# Patient Record
Sex: Female | Born: 1950 | Race: White | Hispanic: No | Marital: Married | Smoking: Never smoker
Health system: Southern US, Community
[De-identification: ages and names within clinical notes are randomized; demographics above are authoritative.]

## PROBLEM LIST (undated history)

## (undated) DIAGNOSIS — G2581 Restless legs syndrome: Secondary | ICD-10-CM

## (undated) DIAGNOSIS — E119 Type 2 diabetes mellitus without complications: Secondary | ICD-10-CM

## (undated) DIAGNOSIS — I10 Essential (primary) hypertension: Secondary | ICD-10-CM

## (undated) DIAGNOSIS — C50919 Malignant neoplasm of unspecified site of unspecified female breast: Secondary | ICD-10-CM

## (undated) DIAGNOSIS — G4733 Obstructive sleep apnea (adult) (pediatric): Secondary | ICD-10-CM

## (undated) DIAGNOSIS — E785 Hyperlipidemia, unspecified: Secondary | ICD-10-CM

## (undated) HISTORY — PX: KNEE SURGERY: SHX244

## (undated) HISTORY — DX: Obstructive sleep apnea (adult) (pediatric): G47.33

## (undated) HISTORY — DX: Hyperlipidemia, unspecified: E78.5

## (undated) HISTORY — DX: Malignant neoplasm of unspecified site of unspecified female breast: C50.919

## (undated) HISTORY — DX: Restless legs syndrome: G25.81

## (undated) HISTORY — DX: Type 2 diabetes mellitus without complications: E11.9

## (undated) HISTORY — DX: Essential (primary) hypertension: I10

## (undated) HISTORY — PX: CHOLECYSTECTOMY: SHX55

---
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
## 1972-01-30 HISTORY — PX: TUBAL LIGATION: SHX77

## 1973-01-29 HISTORY — PX: CARPAL TUNNEL RELEASE: SHX101

## 1992-01-30 HISTORY — PX: CARPAL TUNNEL RELEASE: SHX101

## 2003-12-28 ENCOUNTER — Ambulatory Visit: Payer: Self-pay | Admitting: Cardiology

## 2004-01-30 HISTORY — PX: PARTIAL HYSTERECTOMY: SHX80

## 2006-01-29 HISTORY — PX: TOTAL KNEE ARTHROPLASTY: SHX125

## 2006-01-29 HISTORY — PX: BACK SURGERY: SHX140

## 2006-07-11 ENCOUNTER — Inpatient Hospital Stay (HOSPITAL_COMMUNITY): Admission: RE | Admit: 2006-07-11 | Discharge: 2006-07-13 | Payer: Self-pay | Admitting: Orthopedic Surgery

## 2008-07-16 IMAGING — CR DG CHEST 2V
2 series · 2 of 2 positions shown · non-contrast
Comparison: There are no prior studies for comparison.

CLINICAL DATA: Preop for lumbar spine surgery today, hypertension. Reported HNP at L4-5 and bilateral foraminal stenosis at L4-S1. 
CHEST - 2 VIEW:

[w chest pa]
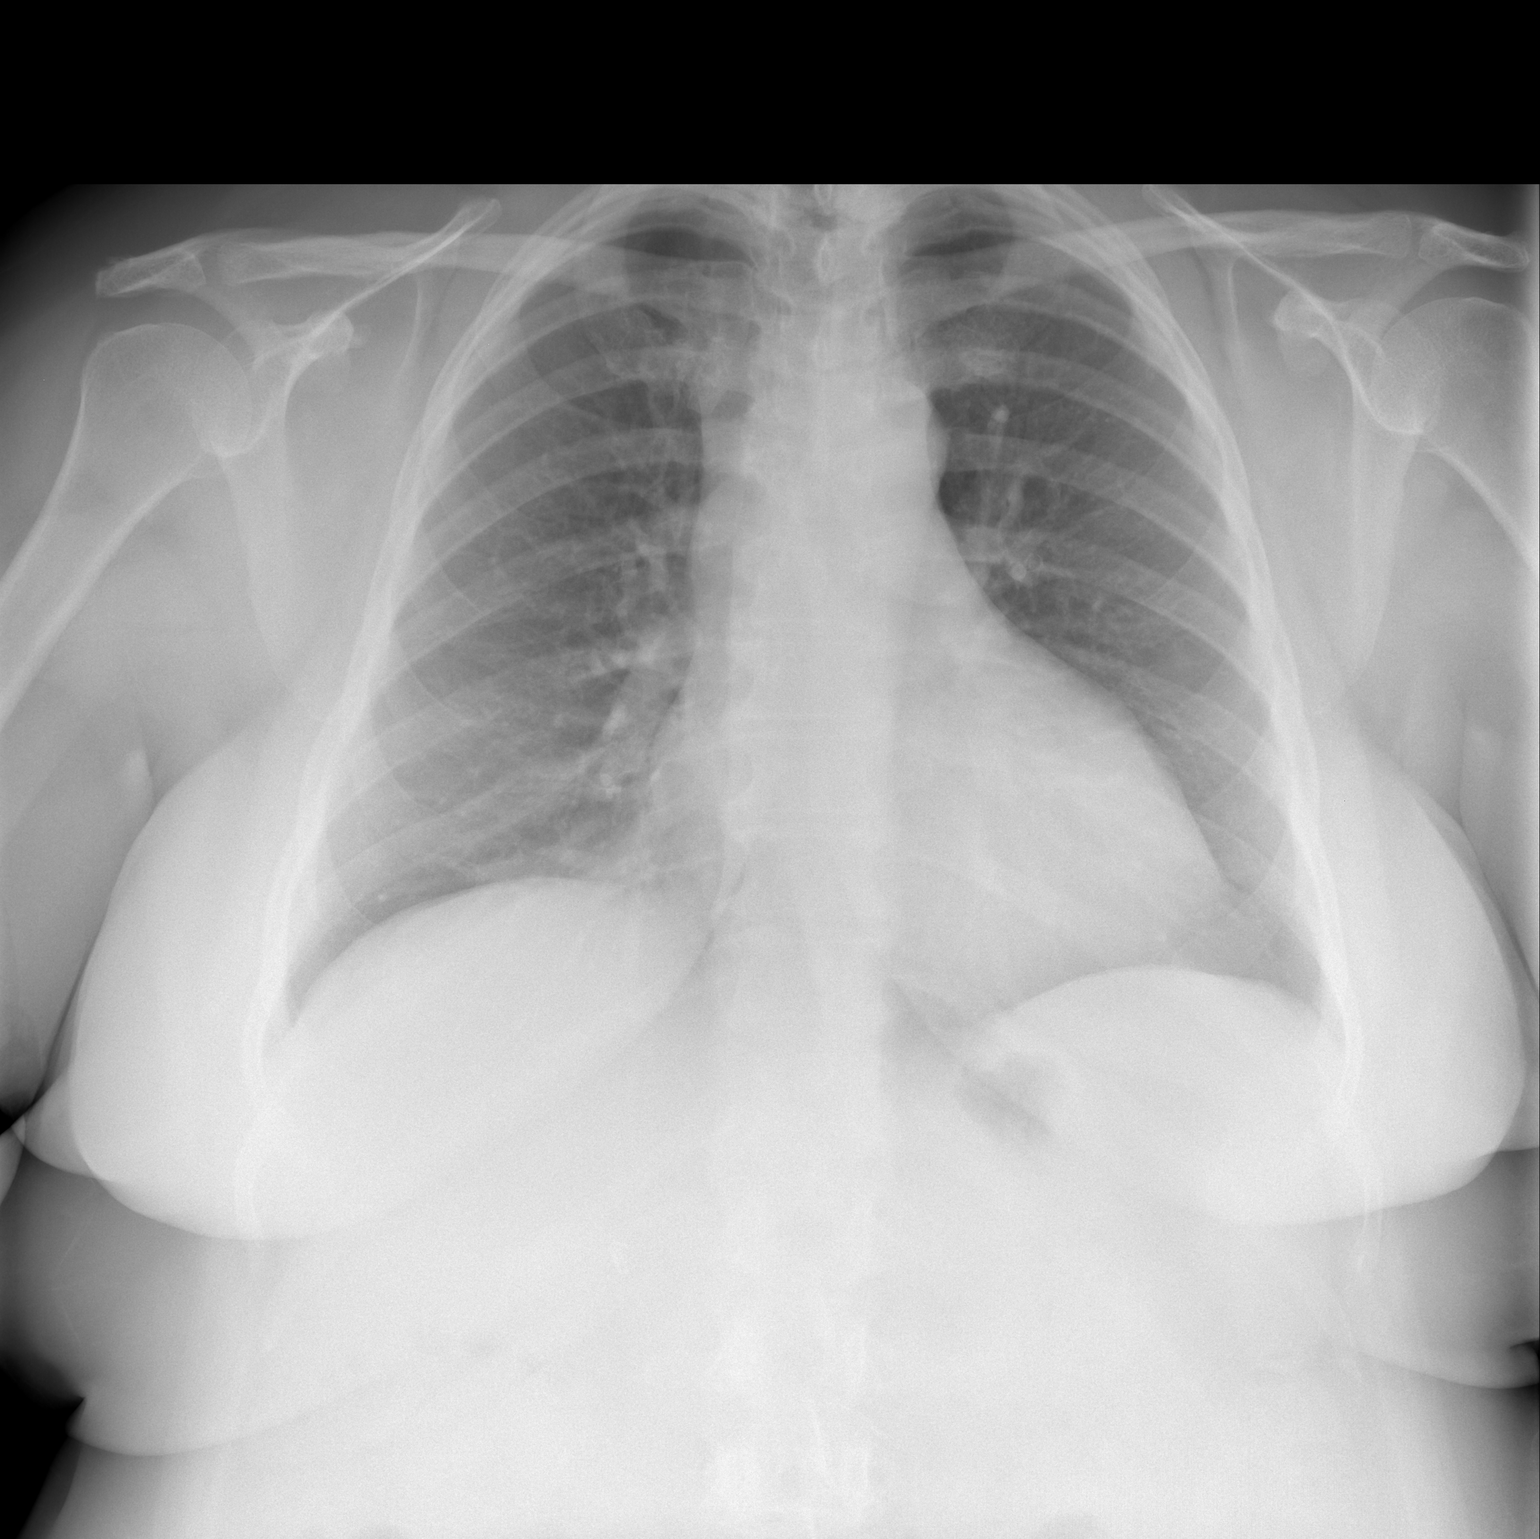

[w chest lat]
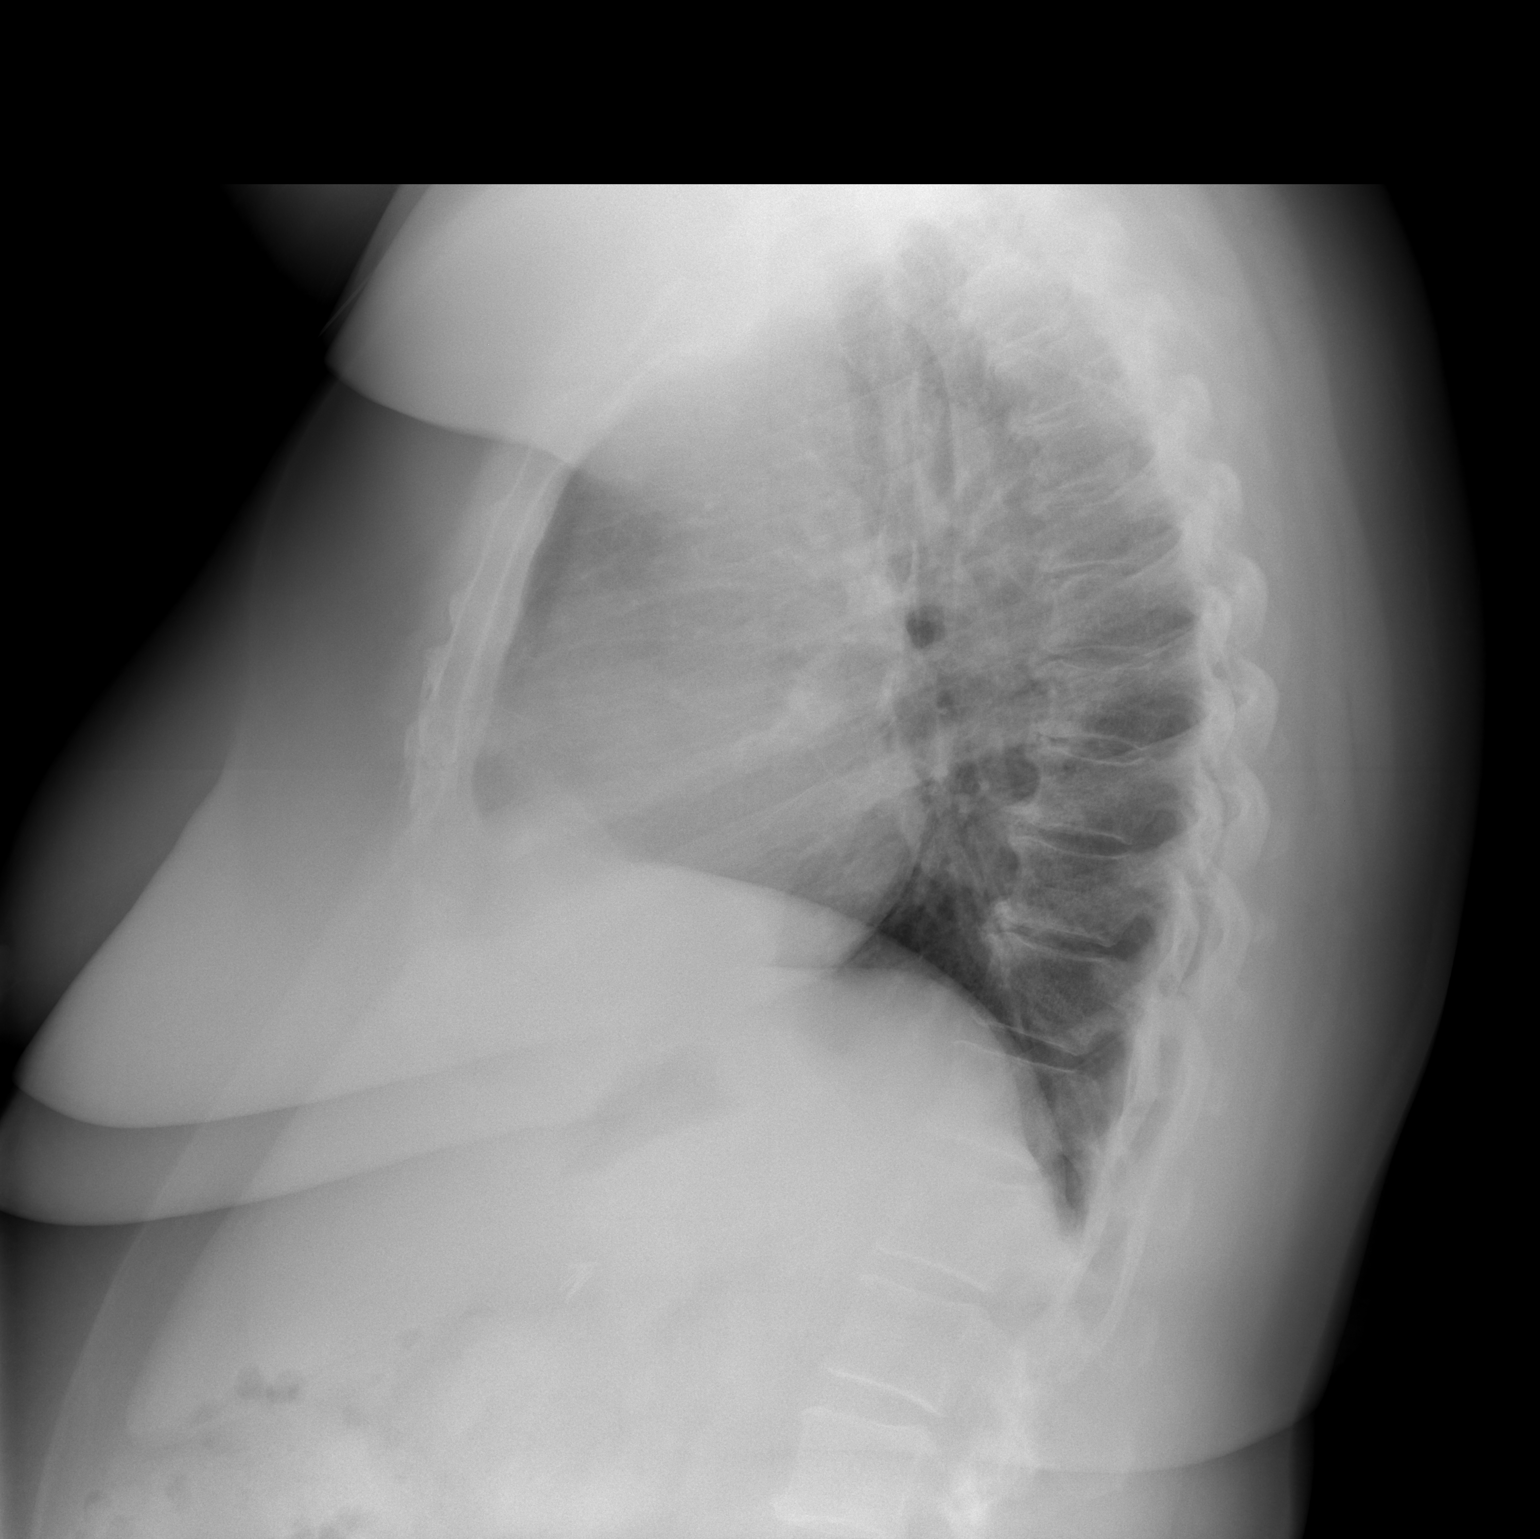

[2 of 2 positions shown; findings below may reference images not displayed]

FINDINGS: The heart appears mild to moderately enlarged.  No pulmonary vascular congestion or active lung process.   Degenerative spondylosis of the thoracic spine.
IMPRESSION: Cardiomegaly.

## 2009-12-16 ENCOUNTER — Encounter: Admission: RE | Admit: 2009-12-16 | Discharge: 2009-12-16 | Payer: Self-pay | Admitting: Family Medicine

## 2009-12-23 ENCOUNTER — Encounter: Admission: RE | Admit: 2009-12-23 | Discharge: 2009-12-23 | Payer: Self-pay | Admitting: Family Medicine

## 2010-01-29 HISTORY — PX: BREAST LUMPECTOMY: SHX2

## 2010-05-24 ENCOUNTER — Encounter (HOSPITAL_COMMUNITY): Payer: PRIVATE HEALTH INSURANCE

## 2010-05-24 ENCOUNTER — Other Ambulatory Visit: Payer: Self-pay | Admitting: Orthopedic Surgery

## 2010-05-24 ENCOUNTER — Other Ambulatory Visit (HOSPITAL_COMMUNITY): Payer: Self-pay | Admitting: Orthopedic Surgery

## 2010-05-24 ENCOUNTER — Ambulatory Visit (HOSPITAL_COMMUNITY)
Admission: RE | Admit: 2010-05-24 | Discharge: 2010-05-24 | Disposition: A | Discharge status: Home / Self Care | Payer: PRIVATE HEALTH INSURANCE | Source: Ambulatory Visit | Attending: Orthopedic Surgery | Admitting: Orthopedic Surgery

## 2010-05-24 DIAGNOSIS — Z01818 Encounter for other preprocedural examination: Secondary | ICD-10-CM

## 2010-05-24 DIAGNOSIS — M1710 Unilateral primary osteoarthritis, unspecified knee: Secondary | ICD-10-CM | POA: Insufficient documentation

## 2010-05-24 DIAGNOSIS — Z01811 Encounter for preprocedural respiratory examination: Secondary | ICD-10-CM | POA: Insufficient documentation

## 2010-05-24 DIAGNOSIS — Z853 Personal history of malignant neoplasm of breast: Secondary | ICD-10-CM | POA: Insufficient documentation

## 2010-05-24 DIAGNOSIS — R0602 Shortness of breath: Secondary | ICD-10-CM | POA: Insufficient documentation

## 2010-05-24 DIAGNOSIS — Z01812 Encounter for preprocedural laboratory examination: Secondary | ICD-10-CM | POA: Insufficient documentation

## 2010-05-24 DIAGNOSIS — M47814 Spondylosis without myelopathy or radiculopathy, thoracic region: Secondary | ICD-10-CM | POA: Insufficient documentation

## 2010-05-24 LAB — URINALYSIS, ROUTINE W REFLEX MICROSCOPIC
Glucose, UA: NEGATIVE mg/dL
Protein, ur: NEGATIVE mg/dL
Specific Gravity, Urine: 1.021 (ref 1.005–1.030)
pH: 6 (ref 5.0–8.0)

## 2010-05-24 LAB — DIFFERENTIAL
Basophils Absolute: 0.1 10*3/uL (ref 0.0–0.1)
Basophils Relative: 1 % (ref 0–1)
Neutro Abs: 3.3 10*3/uL (ref 1.7–7.7)
Neutrophils Relative %: 45 % (ref 43–77)

## 2010-05-24 LAB — APTT: aPTT: 31 seconds (ref 24–37)

## 2010-05-24 LAB — SURGICAL PCR SCREEN: MRSA, PCR: NEGATIVE

## 2010-05-24 LAB — PROTIME-INR: Prothrombin Time: 13.1 seconds (ref 11.6–15.2)

## 2010-05-24 LAB — BASIC METABOLIC PANEL
CO2: 29 mEq/L (ref 19–32)
Calcium: 9.6 mg/dL (ref 8.4–10.5)
Creatinine, Ser: 1.01 mg/dL (ref 0.4–1.2)
GFR calc Af Amer: >60 mL/min (ref >60)

## 2010-05-24 LAB — CBC
Hemoglobin: 11.5 g/dL — ABNORMAL LOW (ref 12.0–15.0)
RBC: 4.02 MIL/uL (ref 3.87–5.11)

## 2010-05-29 ENCOUNTER — Inpatient Hospital Stay (HOSPITAL_COMMUNITY)
Admission: RE | Admit: 2010-05-29 | Discharge: 2010-06-01 | DRG: 470 | Disposition: A | Discharge status: Home / Self Care | Payer: PRIVATE HEALTH INSURANCE | Source: Ambulatory Visit | Attending: Orthopedic Surgery | Admitting: Orthopedic Surgery

## 2010-05-29 DIAGNOSIS — M216X9 Other acquired deformities of unspecified foot: Secondary | ICD-10-CM | POA: Diagnosis present

## 2010-05-29 DIAGNOSIS — M1710 Unilateral primary osteoarthritis, unspecified knee: Principal | ICD-10-CM | POA: Diagnosis present

## 2010-05-29 DIAGNOSIS — G2581 Restless legs syndrome: Secondary | ICD-10-CM | POA: Diagnosis present

## 2010-05-29 DIAGNOSIS — E119 Type 2 diabetes mellitus without complications: Secondary | ICD-10-CM | POA: Diagnosis present

## 2010-05-29 LAB — GLUCOSE, CAPILLARY

## 2010-05-29 LAB — TYPE AND SCREEN: Antibody Screen: NEGATIVE

## 2010-05-30 LAB — BASIC METABOLIC PANEL
BUN: 15 mg/dL (ref 6–23)
CO2: 27 mEq/L (ref 19–32)
Calcium: 8.5 mg/dL (ref 8.4–10.5)
Creatinine, Ser: 0.83 mg/dL (ref 0.4–1.2)
GFR calc Af Amer: >60 mL/min (ref >60)
Glucose, Bld: 133 mg/dL — ABNORMAL HIGH (ref 70–99)

## 2010-05-30 LAB — CBC
HCT: 29.7 % — ABNORMAL LOW (ref 36.0–46.0)
Hemoglobin: 9.4 g/dL — ABNORMAL LOW (ref 12.0–15.0)
MCH: 28.9 pg (ref 26.0–34.0)
MCHC: 31.6 g/dL (ref 30.0–36.0)
MCV: 91.4 fL (ref 78.0–100.0)

## 2010-05-31 LAB — URINALYSIS, MICROSCOPIC ONLY
Glucose, UA: NEGATIVE mg/dL
Leukocytes, UA: NEGATIVE
Nitrite: NEGATIVE
Protein, ur: NEGATIVE mg/dL
pH: 5 (ref 5.0–8.0)

## 2010-05-31 LAB — CBC
HCT: 31.2 % — ABNORMAL LOW (ref 36.0–46.0)
Hemoglobin: 9.8 g/dL — ABNORMAL LOW (ref 12.0–15.0)
MCV: 91.5 fL (ref 78.0–100.0)
RBC: 3.41 MIL/uL — ABNORMAL LOW (ref 3.87–5.11)
WBC: 9.9 10*3/uL (ref 4.0–10.5)

## 2010-05-31 LAB — BASIC METABOLIC PANEL
BUN: 16 mg/dL (ref 6–23)
Chloride: 99 mEq/L (ref 96–112)
Glucose, Bld: 117 mg/dL — ABNORMAL HIGH (ref 70–99)
Potassium: 4.1 mEq/L (ref 3.5–5.1)
Sodium: 137 mEq/L (ref 135–145)

## 2010-05-31 LAB — GLUCOSE, CAPILLARY
Glucose-Capillary: 126 mg/dL — ABNORMAL HIGH (ref 70–99)
Glucose-Capillary: 143 mg/dL — ABNORMAL HIGH (ref 70–99)

## 2010-06-01 LAB — URINE CULTURE
Colony Count: NO GROWTH
Culture: NO GROWTH

## 2010-06-02 NOTE — Op Note (Signed)
Courtney Floyd, Courtney Floyd               ACCOUNT NO.:  [id number]  MEDICAL RECORD NO.:  [id number]           PATIENT TYPE:  I  LOCATION:  1616                         FACILITY:  [facility]  PHYSICIAN:  Madlyn Frankel. Charlann Boxer, M.D.  DATE OF BIRTH:  1950/02/27  DATE OF PROCEDURE: DATE OF DISCHARGE:                              OPERATIVE REPORT   DATE OF PROCEDURE:  May 29, 2010  PREOPERATIVE DIAGNOSIS:  Right knee osteoarthritis with significant varus deformity.  POSTOPERATIVE DIAGNOSIS:  Right knee osteoarthritis with significant varus deformity.  PROCEDURE:  Right total knee replacement utilizing DePuy component size 2, rotating platform, size 2 femur, 2 tibia, 10-mm insert and a 35 patellar button.  SURGEON:  Madlyn Frankel. Charlann Boxer, M.D.  ASSISTANT:  Charlene Brooke, PA-C  ANESTHESIA:  Spinal.  SPECIMEN:  None.  COMPLICATIONS:  None.  DRAINS:  One Hemovac.  TOURNIQUET TIME:  56 minutes at 250 mmHg.  INDICATIONS FOR THE PROCEDURE:  Courtney Floyd is a 60 year old pleasant female referred with a request for surgical consideration for advanced right knee osteoarthritis with significant varus flexion deformity, she had failed conservative measures, had significant reduction in overall quality of life and at this point is ready to proceed with more definitive measures.  Risks of infection, DVT, component failure, need for revision surgery were all discussed and reviewed.  Consent was obtained for the benefit of pain relief.  PROCEDURE IN DETAIL:  The patient was brought to operative theater. Once adequate anesthesia, preoperative antibiotics, Ancef 2 grams were administered the patient was positioned supine and on the right thigh tourniquet placed.  The right lower extremity was prepped and draped in a sterile fashion with the right leg placed in a Mayo legholder.  A time- out was performed identifying the patient, planned procedure and extremity.  The leg was exsanguinated, tourniquet elevated  to 250 mmHg.  A midline incision was made followed by median and parapatellar arthrotomy.  Following initial exposure and debridement, attention was first directed to the patella.  She was noted to have significant fibrosis and scarring tied into this patellofemoral area laterally.  The precut measurement was approximately 21 mm.  I resected down to 14 mm using a 35 patellar button to restore height.  Lug holes were drilled and a metal shim placed to protect the patella from retraction of the saw blades.  Attention was now directed to the femur.  A femoral canal was opened with a drill, irrigated to try to prevent fat emboli.  An intramedullary rod was passed at 3 degrees of valgus.  I resected initially 10 mm bone off the distal femur.  Following this resection the tibia was subluxated anteriorly.  I had already done a proximal medial peel, then further opened up this medial side due to her advanced medial disease.  I removed medial osteophytes.  An extramedullary guide was positioned and based on the proximal lateral tibia 10-mm bone was resected.  I did this as a heavy 10 mm resection to allow me to try to cover as much of the medial defect as possible, which I did.  Following this resection, we confirmed that the  cut was perpendicular in a coronal plane and an appropriate slope using an alignment rod and then checked to see if the gap was going to be at least open with a 10-mm block.  Once I was confident that it still felt that she was tight medially I did release a bit more tissues medially at this point, soft tissues on the deep MCL side.  At this point I sized the femur, initially I decided to be a 2-1/2, a 2- 1/2 rotation block was pinned into position, anterior reference using the C clamp of the proximal tibial cut.  The 4-1 cutting block was then pinned into position, drilled and keel punched.  The anterior, posterior and chamfer cuts were then made without difficulty, no  notching.  A final box cut was made off the lateral aspect of the distal femur. The tibia was then subluxated anteriorly and given the medial significant degenerative changes with remodelling medially I removed medial osteophytes further less inside of the defect that was present and lateralized a size 52 tibial tray.  I then drilled and keel punched this trial reduction of the 2-1/2 femur, 2 tibia, 10-mm insert.  With this insert in place it still felt tight medially and I did not feel that I would get it out to full extension.  There was also some tightness in flexion upon trying to place the insert.  Given this I made a decision to downsize the femur to a size 2.  At the same time I was going to remove 2 more millimeters bone off the distal femur for extension gap purposes.  With this the trial femur was removed, the distal femoral cut was pinned into position using the angel wing to hold the cutting block in position, then backed up 2 mm and removed 2 more millimeters distal bone.  I then pinned the original 2-1/2 of 4-1 cutting block into position and exchanged  this out for a size 2 block in that way to equal out the anterior and posterior cuts.  The 4-1 cutting of the anterior and posterior chamfer cuts were revisited as was the box cut.  At this point a trial reduction with a 2 tibial tray in place and a 10-mm insert.  I was happy that the knee at this point came to full extension. It still felt a little tight medially and with the knee out in extension I did release a little bit more of the medial tissues.  At this point I felt that the knee came out in full extension and the knee felt much more balanced and stable from extension of flexion without increase in size of the polyethylene insert.  The patella tracked through the trochlea without application of pressure.  Given all these findings I removed all the trial components.  The final components were opened including the  polyethylene insert.  The knee was injected with 1/4% Marcaine with epinephrine and 1 mL of Toradol into the synovial capsule junction, total of 61 mL.  I then irrigated the knee out with normal saline solution pulse lavage.  Final components were opened and cement was mixed with gentamicin impregnated cement.  The final closure was cemented into position onto clean and dry cut surfaces of the bone.  The knee was brought into extension with a 10 mm insert and extruded cement was removed.  Once this had fully cured and excess cement was removed throughout the knee the final 10-mm insert was chosen.  The tourniquet had been  let down at 56 minutes at 250 mmHg. The knee was re-irrigated and a Hemovac drain was placed deep.  The extensor mechanism was then reapproximated using #1 Vicryl and the knee in flexion.  The remainder of the wound was closed with 2-0 Vicryl and running 4-0 Monocryl.  The knee was cleaned, dried and dressed sterilely using Dermabond operative dressing.  The drain was dressed separately.  The knee was wrapped loosely in an Ace wrap.  She was brought to the recovery room in a stable condition having tolerated the procedure well.     [name], M.D.     MDO/MEDQ  D:  05/29/2010  T:  05/30/2010  Job:  [id number]  Electronically Signed by Durene Romans M.D. on 06/02/2010 08:13:39 AM

## 2010-06-13 NOTE — Op Note (Signed)
NAMEAZARIYA, FREEMAN NO.:  [id number]   MEDICAL RECORD NO.:  [id number]          PATIENT TYPE:  INP   LOCATION:  1537                         FACILITY:  [facility]   PHYSICIAN:  Marlowe Kays, M.D.  DATE OF BIRTH:  05-16-1950   DATE OF PROCEDURE:  07/11/2006  DATE OF DISCHARGE:                               OPERATIVE REPORT   PREOPERATIVE DIAGNOSIS:  1. 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  2. Spondylosis L5-S1 with disk herniation on the right and foraminal      stenosis on the left.   POSTOPERATIVE DIAGNOSIS:  Spondylosis and central and foraminal stenosis  L4-5, L5-S1, no disk herniations noted.   OPERATION:  1. Decompressive laminectomy L4-5 left with inspection of the L4-5      disk and decompression L5 nerve root.  2. Central and foraminal decompression L5-S1 with inspection of the L5-      S1 disk on the right and bilateral foraminotomies.  3. Total laminectomy L5 right with decompression of the L5 nerve root.   SURGEON:  Dr. Simonne Come   ASSISTANT:  Georges Lynch. Darrelyn Hillock, M.D.   ANESTHESIA:  General.   JUSTIFICATION FOR PROCEDURE:  She has had a long history of restless leg  syndrome more recently significant pain mainly in the left leg, also  slightly in the right and she is presented with a foot drop on the left.  An MRI has demonstrated the preoperative diagnoses.  Original plan was  at L4-5 to perform microdiskectomy on the left and foraminal  decompression on the right and at L5-S1 microdiskectomy on the right and  foraminal decompression on the left.  We were unable to find any disk  herniations at the two levels depicted on the MRI and on postoperative  review of her plain x-rays, bone spurring at these levels was what was  probably creating the picture of the disk herniations on the MRI.   PROCEDURE:  Prophylactic antibiotics, satisfactory general anesthesia,  time-out  performed, knee-chest position on the [place] frame.  Back was  prepped with DuraPrep and with two spinal needles and lateral x-ray.  We  tentatively located the L4-5, L5-S1 interspaces.  I then continued  draping the back in a sterile field, Ioban employed.  Vertical midline  incision. She is quite heavy and was somewhat of free bleeder making  progress slow but we eventually were able to dissect the soft tissue off  the lower lumbar vertebra, placed self-retaining McCullough retractor.  A second lateral x-ray was taken confirming our locations as L4-5 and L5-  S1. Working first at L5-S1 on the left which I felt would be a  foraminotomy. Because of the spondylosis her space was quite tight. I  did a preliminary partial decompression and then moved up to 4-5 on the  left with a similar type of problem with exposure. At this point we  brought in the microscope and I completed the decompression L5-S1  because of the difficulty in working in the deep hole  and with her  stenosis.  I did central exposure at L5-S1 and we worked both the left  and right performing foraminotomies and full decompression. Then on the  right where we planned to do a foraminal decompression.  She had  somewhat different anatomy then on the left with a short lamina and I  ended up removing essentially the entire lamina of L5, working from  caudad to cephalad until I could get up to the L4-5 neural foramen and  decompress it thoroughly. We then looked at the disk at L5-S1 and it was  hard with no disk material obtainable.  Lastly, I then went to L4-5 on  the left and completed the decompression there with a wide decompression  of the L5 nerve root.  We identified the disk which was likewise hard.  There was no extruded disk material and on review of the plain x-rays.  It appeared that the apparent extruded disk material was due to bone.  Having thoroughly decompressed all four nerve roots and with no  complications and  we concluded that we could not improve things.  Wound  was irrigated with sterile saline.  I placed a 1/4 inch Penrose drain  deep because of her bleeding on top of Gelfoam soaked in thrombin and  over the exposed dura. The wound was then closed in layers under direct  visualization around the drain with interrupted #1 Vicryl in the fascia  and deep subcutaneous tissue, 2-0 Vicryl subcutaneous tissue  superficially and staples in the skin.  Betadine Adaptic dry sterile  dressing were applied.  She was gently placed in her PACU bed and taken  in satisfactory condition with no known complications.  Estimated blood  loss was perhaps 100 mL, no blood replacement.           ______________________________  Marlowe Kays, M.D.     JA/MEDQ  D:  07/12/2006  T:  07/12/2006  Job:  [id number]

## 2010-06-19 NOTE — H&P (Signed)
Courtney Floyd, Courtney Floyd NO.:  [id number]  MEDICAL RECORD NO.:  [id number]           PATIENT TYPE:  LOCATION:                                 FACILITY:  PHYSICIAN:  Madlyn Frankel. Charlann Boxer, M.D.  DATE OF BIRTH:  [birth year]  DATE OF ADMISSION:  05/29/2010 DATE OF DISCHARGE:                             HISTORY & PHYSICAL   DATE OF SURGERY:  May 29, 2010.  ADMISSION DIAGNOSIS:  Right knee osteoarthritis.  HISTORY OF PRESENT ILLNESS:  This is 60 year old lady with a history of osteoarthritis of the right knee with failure of conservative treatment to eliminate her pain.  After discussion of treatment, benefits, risks, and options, the patient is now scheduled for total knee arthroplasty. Please note that the patient is not a candidate for tranexamic acid and will not receive it preop.  She is given her home medications to take. Note that she also has sleep apnea and uses CPAP and we will use that in the hospital.  PAST MEDICAL HISTORY:  Drug Allergies:  CODEINE with itching.  Current Medications: 1. Levothyroxine 50 mcg 1 daily. 2. Diclofenac 75 mg 1 daily. 3. Lasix 80 mg 1/2 tablet daily. 4. Lisinopril/hydrochlorothiazide 20/25 one daily. 5. Metformin ER 500 mg 2 tablets in the evening. 6. ReQuip 2 mg 1 tablet morning and evening. 7. ReQuip XL 2 mg 1 tablet a.m., 1 tablet afternoon, and 1 tablet in     the evening. 8. Citalopram 40 mg 1 tablet in the morning and 1/2 tablet in the     evening. 9. Hydrocodone 5/325 p.r.n. pain. 10.Tramadol 50 mg p.r.n. pain. 11.Tamoxifen 20 mg 1 daily. 12.Nitrostat 0.4 mg p.r.n. chest pain which is rarely used.  Nonprescription medicines include: 1. Aspirin 81 mg once a day. 2. B complex magnesium. 3. Potassium gluconate.  Previous surgeries include tonsillectomy, cesarean section, tubal ligation, hysterectomy, liver biopsy for fatty liver disease, carpal tunnel release, cholecystectomy, kidney stones removed with a  stent, oophorectomy, carpal tunnel release, lumbar disk surgery, and breast lumpectomy for breast cancer.  The patient has had 35 radiation treatments for her breast cancer.  MEDICAL DOCTOR: 1. Dr. Fara Chute in [place]. 2. Lindaann Slough, MD, for Urology. 3. Danella Maiers, MD, for heart disease.  FAMILY HISTORY:  Positive for heart disease and cancer.  SOCIAL HISTORY:  The patient is married.  She is living at home.  She does not smoke and does not drink.  REVIEW OF SYSTEMS:  CENTRAL NERVOUS SYSTEM:  Positive for anxiety and occasional impaired memory.  PULMONARY:  Negative for shortness breath, PND, or orthopnea.  Positive for sleep apnea and she does use CPAP and is advised to bring her mask with her to the hospital.  CARDIOVASCULAR: Positive for occasional chest pain that she relates to stress.  She did have clearance from her medical doctor.  GI:  Negative for ulcers or hepatitis.  Positive for gallbladder disease in the past.  GU:  Positive for history of kidney stone.  HEMATOLOGIC/ONCOLOGIC:  Positive for breast cancer.  MUSCULOSKELETAL:  Positive as in HPI.  PHYSICAL EXAMINATION:  VITAL SIGNS:  BP 140/68, respirations  14, and pulse 60 and regular. GENERAL APPEARANCE:  This is a well-developed, well-nourished lady in no acute distress. HEENT:  Head is normocephalic.  Nose is patent.  Ears are patent. Pupils are equal, round, and reactive to light.  Throat without injection. NECK:  Supple without adenopathy.  Carotids are 2+ without bruit. CHEST:  Clear to auscultation.  No rales or rhonchi.  Respirations are 40. HEART:  Regular rate and rhythm at 68 beats per minute without murmur. ABDOMEN:  Soft with active bowel sounds.  No masses or organomegaly. NEUROLOGIC:  The patient is alert and oriented to time, place, and person.  Cranial nerves II-XII grossly intact. EXTREMITIES:  Right knee with a 10-degree flexion contracture and further flexion to 95 degrees with pain.   Neurovascular status is intact.  IMPRESSION:  Osteoarthritis, right knee.  PLAN:  Total knee arthroplasty, right knee.     Jaquelyn Bitter. Chabon, P.A.   ______________________________ [name], M.D.    SJC/MEDQ  D:  05/24/2010  T:  05/25/2010  Job:  [id number]  Electronically Signed by Jodene Nam P.A. on 06/19/2010 12:39:52 PM Electronically Signed by Durene Romans M.D. on 06/19/2010 01:00:46 PM

## 2010-06-20 NOTE — Discharge Summary (Signed)
Courtney Floyd, TSAO               ACCOUNT NO.:  [id number]  MEDICAL RECORD NO.:  [id number]           PATIENT TYPE:  I  LOCATION:  1616                         FACILITY:  [facility]  PHYSICIAN:  Madlyn Frankel. Charlann Boxer, M.D.  DATE OF BIRTH:  03-27-1950  DATE OF ADMISSION:  05/29/2010 DATE OF DISCHARGE:  06/01/2010                              DISCHARGE SUMMARY   ADMITTING DIAGNOSIS:  Right knee osteoarthritis.  DISCHARGE DIAGNOSES: 1. Right knee osteoarthritis status post right total knee replacement,     May 29, 2010. 2. Hypothyroidism. 3. Hypertension. 4. Diabetes. 5. Restless legs syndrome.  She has had lumbar spine surgery with some chronic pain as well as history of breast cancer, for which she is currently still on tamoxifen.  ADMITTING HISTORY:  [name] is a 60 year old female with history of right knee osteoarthritis, who had failed conservative measures.  She was seen and evaluated in the office routine time, failing injections and medications.  She, at this point, is ready for more definitive measures.  Risks and benefits of knee replacement surgery were discussed and reviewed. Consent was obtained for right total knee replacement.  She is not a candidate for Tranexamic Acid due to history of breast cancer.  HOSPITAL COURSE:  The patient admitted for same-day surgery on May 29, 2010.  Please see dictated operative note for full details of the procedure.  After routine stay in the recovery room, she was transferred to the orthopedic ward where she remained until postop day #3 at the time of discharge.  Postoperative day #1, she had her Foley catheter and Hemovac drains removed.  She was seen and evaluated by Physical Therapy.  Postoperative day #1 labs included hematocrit of 29.7 with stable electrolytes.  No evidence of any bump in her creatinine.  By postoperative day #2, her labs were stable without significant change.  Her wound was clean, dry, and intact.  She  was up with physical therapy.  The plan was to go home with home health physical therapy, arrangements were made on postop day 2.  By postop day #3, she remained asymptomatic with normal vitals.  No new labs had been ordered.  We did order urinalysis based on some increased frequency and this was negative at the time of discharge.  Arrangements were made for home health physical therapy.  DISCHARGE CONDITION:  The patient stable at the time of discharge.  DISCHARGE INSTRUCTIONS:  The patient will be discharged home with home health physical therapy to work on range of motion, strengthening, and gait training.  She will return to see Dr. Durene Romans at [facility] at [phone] in 2 weeks' time.  Should be on a diabetic diet or at least her home diet managing her diabetes as much as possible help reduce chance of infection.  DISCHARGE MEDICATIONS: 1. Colace 100 mg p.o. b.i.d. as needed for constipation while on pain     medicine. 2. Enteric-coated aspirin 325 mg p.o. b.i.d. for 30 days. 3. Iron 325 mg p.o. daily 2 to 3 times a day as needed for 2 weeks. 4. Norco 7.5/325 one to two tablets every  4 to 6 hours as needed for     pain. 5. Robaxin 500 mg p.o. q.6 h. as needed for muscle spasm and pain. 6. MiraLax 17 g p.o. daily as needed for constipation while on pain     medicine. 7. Aspirin 81 mg, once she has completed 325 mg dose, she will return     to this at 30 days. 8. Citalopram 20 mg p.o. q.h.s. __________ 9. Diclofenac 75 mg p.o. b.i.d. 10.Lasix 80 mg one-half to one tablet daily. 11.Levothyroxine unknown micrograms 1 tablet q.a.m. 12.Lisinopril/hydrochlorothiazide 20/25 mg one p.o. daily. 13.Magnesium gluconate p.o. daily. 14.Metformin 1000 mg q.h.s. 15.Multivitamins p.o. daily. 16.Nitroglycerin sublingual as needed. 17.Potassium gluconate p.o. daily. 18.ReQuip 2 mg b.i.d. as needed. 19.Septra DS b.i.d. for urinary tract infection, but this was  stopped. 20.Tamoxifen 20 mg p.o. daily. 21.Vitamin B complex daily.     [name], M.D.     MDO/MEDQ  D:  06/20/2010  T:  06/20/2010  Job:  [id number]  Electronically Signed by Durene Romans M.D. on 06/20/2010 07:51:32 PM

## 2010-08-03 DIAGNOSIS — R079 Chest pain, unspecified: Secondary | ICD-10-CM

## 2010-11-16 LAB — BASIC METABOLIC PANEL
BUN: 15
Calcium: 9.3
GFR calc non Af Amer: >60
Glucose, Bld: 93
Potassium: 4.4

## 2010-11-16 LAB — PROTIME-INR
INR: 0.9
Prothrombin Time: 12.6

## 2012-04-20 ENCOUNTER — Other Ambulatory Visit: Payer: Self-pay | Admitting: Diagnostic Neuroimaging

## 2012-04-28 ENCOUNTER — Other Ambulatory Visit: Payer: Self-pay | Admitting: Diagnostic Neuroimaging

## 2012-05-23 ENCOUNTER — Telehealth: Payer: Self-pay | Admitting: *Deleted

## 2012-05-23 NOTE — Telephone Encounter (Signed)
pls setup follow up appt for next 2-4 weeks with CM or me. -VRP

## 2012-05-23 NOTE — Telephone Encounter (Signed)
Patient states she is having a hard time with her restless legs, per centricity she is on Requip 2 mg. The patient states she has not slept well in a 2-3 weeks, and wants to change medication or increase your dose.

## 2012-05-29 IMAGING — CR DG CHEST 2V
2 series · 2 of 2 positions shown · non-contrast
Comparison: 07/11/2006

CLINICAL DATA: Right knee osteoarthritis, preoperative for
arthroplasty.  Shortness of breath.  History breast cancer.

CHEST - 2 VIEW

[view not recorded (1 of 2)]
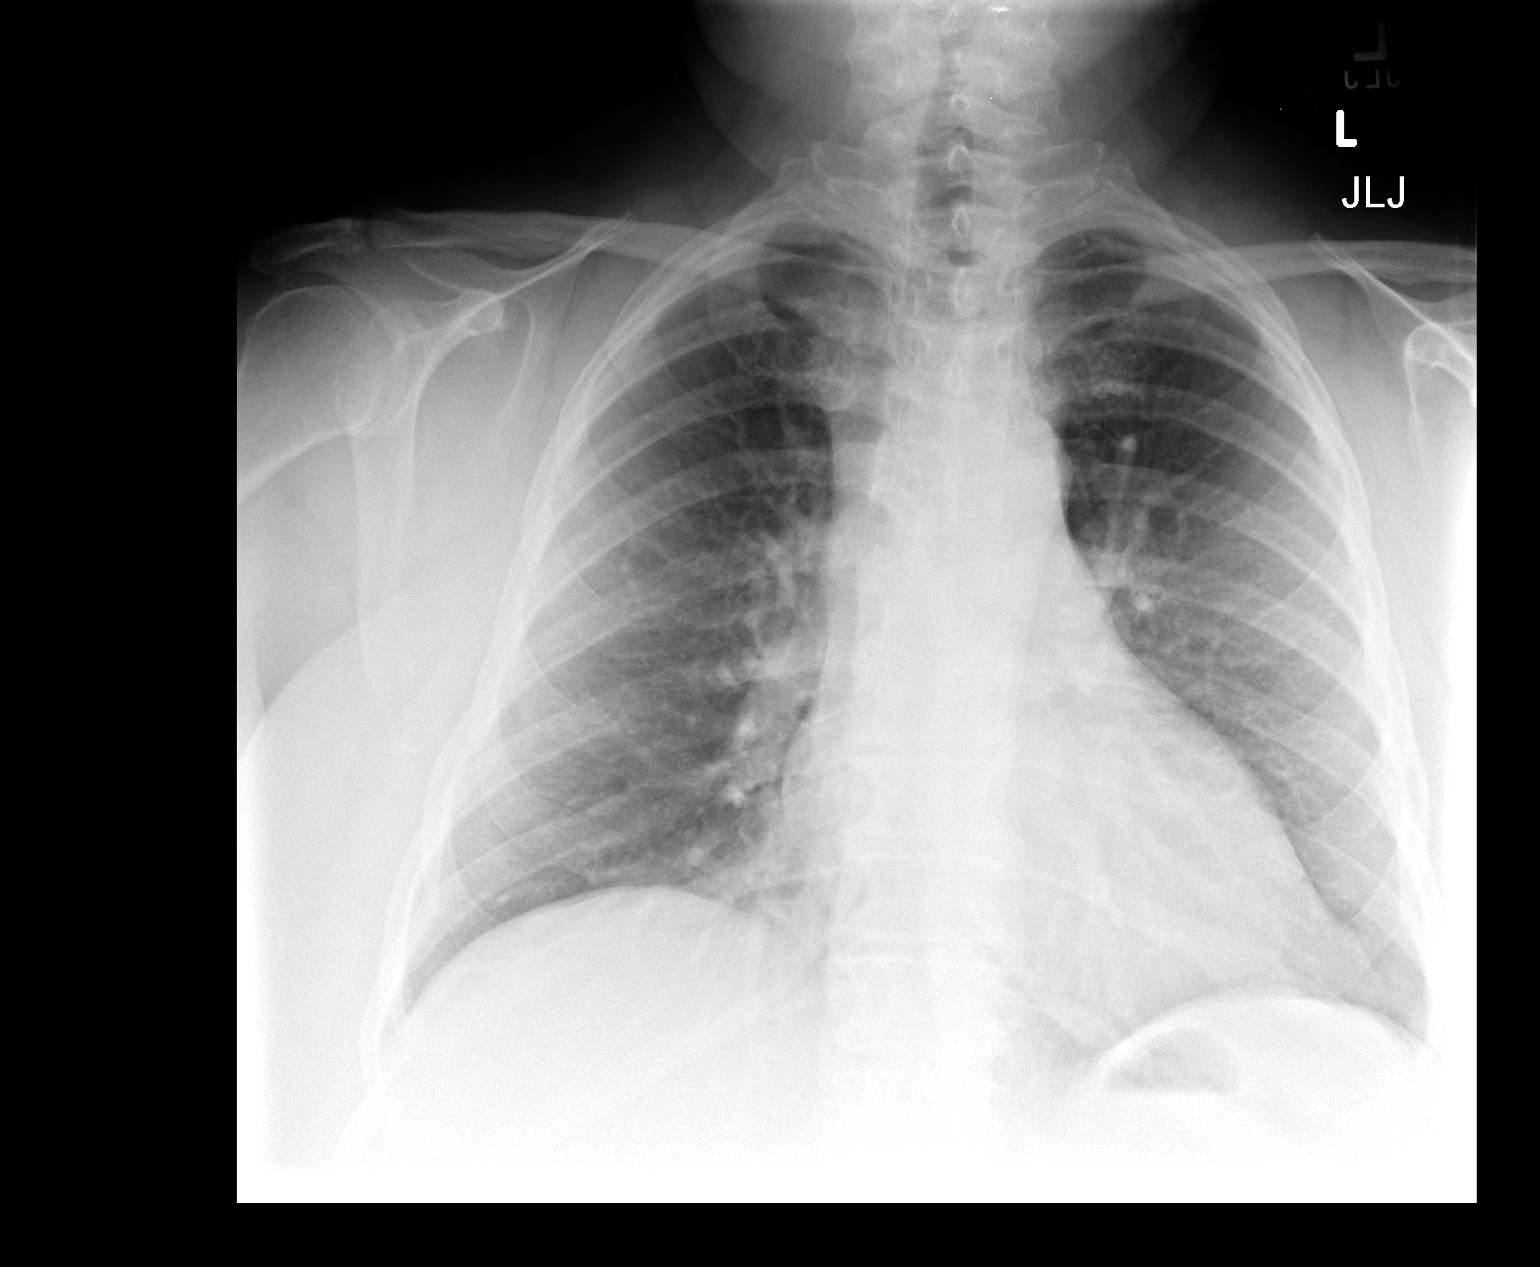

[view not recorded (2 of 2)]
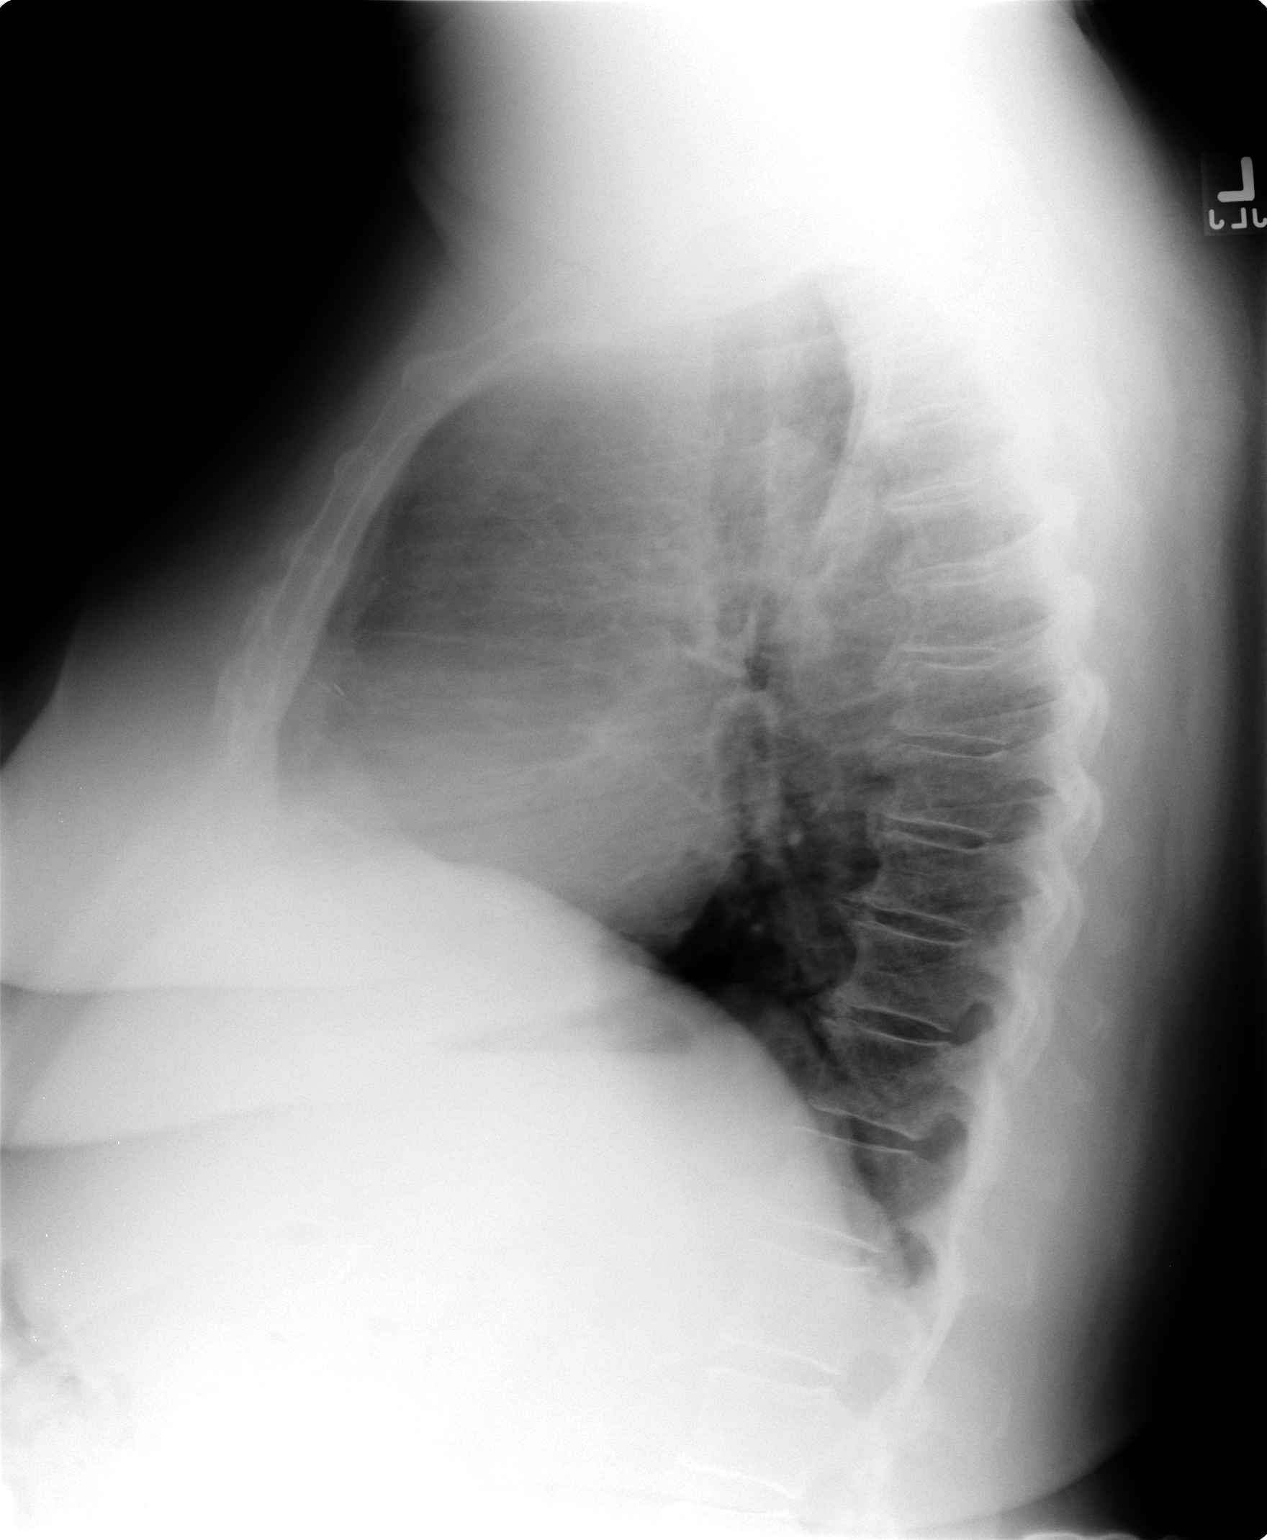

[2 of 2 positions shown; findings below may reference images not displayed]

FINDINGS: Cardiac and mediastinal contours appear normal.

The lungs appear clear.

No pleural effusion is identified.

Thoracic spondylosis noted.
IMPRESSION: No significant abnormality identified.

## 2012-06-16 ENCOUNTER — Ambulatory Visit (INDEPENDENT_AMBULATORY_CARE_PROVIDER_SITE_OTHER): Payer: 59 | Admitting: Internal Medicine

## 2012-06-16 ENCOUNTER — Encounter: Payer: Self-pay | Admitting: Internal Medicine

## 2012-06-16 ENCOUNTER — Encounter: Payer: Self-pay | Admitting: *Deleted

## 2012-06-16 VITALS — BP 133/64 | HR 63 | Ht 62.0 in | Wt 257.0 lb

## 2012-06-16 DIAGNOSIS — R0602 Shortness of breath: Secondary | ICD-10-CM

## 2012-06-16 DIAGNOSIS — Z800 Family history of malignant neoplasm of digestive organs: Secondary | ICD-10-CM | POA: Insufficient documentation

## 2012-06-16 DIAGNOSIS — G2581 Restless legs syndrome: Secondary | ICD-10-CM

## 2012-06-16 DIAGNOSIS — R609 Edema, unspecified: Secondary | ICD-10-CM

## 2012-06-16 DIAGNOSIS — I10 Essential (primary) hypertension: Secondary | ICD-10-CM | POA: Insufficient documentation

## 2012-06-16 DIAGNOSIS — I890 Lymphedema, not elsewhere classified: Secondary | ICD-10-CM | POA: Insufficient documentation

## 2012-06-16 MED ORDER — LOSARTAN POTASSIUM 50 MG PO TABS: 50.0000 mg | ORAL_TABLET | Freq: Every day | ORAL | Status: DC

## 2012-06-16 MED ORDER — OMEPRAZOLE 20 MG PO CPDR: 20.0000 mg | DELAYED_RELEASE_CAPSULE | Freq: Two times a day (BID) | ORAL | Status: DC

## 2012-06-16 NOTE — Patient Instructions (Addendum)
LABS:  BMET, BNP.  STOP Lisinopril and start Cozaar 50mg  daily.  Start Omeprazole 20mg  twice daily.  Your physician has requested that you have an echocardiogram. Echocardiography is a painless test that uses sound waves to create images of your heart. It provides your doctor with information about the size and shape of your heart and how well your heart's chambers and valves are working. This procedure takes approximately one hour. There are no restrictions for this procedure.

## 2012-06-16 NOTE — Progress Notes (Signed)
HPI Patient complains of about 1 year history of progressive SOB, swelling  Has been treated for bronchitis. Treated for pneumonia with shots and steroids last week at Dr Dian Situ office.  COugh is productive of yellow/green mucous. Patient did have CP last week  Constant  Worse with breath. Patient's lasix was increased from 40 qd to 80 AM/40 PM Goes to sleep at drop of hat  Has CPAP but cannot use because of breathing issues now. Does have wheezing  Better after ABX  May be picking up.  Allergies  Allergen Reactions  . Codeine Itching    Current Outpatient Prescriptions  Medication Sig Dispense Refill  . citalopram (CELEXA) 40 MG tablet Take 40 mg by mouth daily.       . diclofenac (VOLTAREN) 75 MG EC tablet Take 75 mg by mouth 2 (two) times daily.      . furosemide (LASIX) 80 MG tablet Take 80 mg by mouth daily.       Marland Kitchen gabapentin (NEURONTIN) 300 MG capsule TAKE 1 CAPSULE BY MOUTH EVERY NIGHT AT BEDTIME , INCREASE TO 1 CAPSULE THREE TIMES DAILY AS TOLERATED  270 capsule  1  . levothyroxine (SYNTHROID, LEVOTHROID) 75 MCG tablet Take 75 mcg by mouth daily before breakfast.       . lisinopril-hydrochlorothiazide (PRINZIDE,ZESTORETIC) 20-25 MG per tablet Take 1 tablet by mouth daily.       . metFORMIN (GLUCOPHAGE-XR) 500 MG 24 hr tablet Take 1,000 mg by mouth daily with breakfast.      . nitroGLYCERIN (NITROSTAT) 0.4 MG SL tablet Place 0.4 mg under the tongue every 5 (five) minutes as needed for chest pain.      Marland Kitchen rOPINIRole (REQUIP) 2 MG tablet TAKE 2 TABLETS BY MOUTH EVERY MORNING , 1 TABLET DAILY AT NOON, AND 2 TABLETS AT BEDTIME  450 tablet  1  . tamoxifen (NOLVADEX) 20 MG tablet Take 20 mg by mouth daily.        No current facility-administered medications for this visit.    No past medical history on file.  No past surgical history on file.  No family history on file.  History   Social History  . Marital Status: Single    Spouse Name: N/A    Number of Children: N/A  .  Years of Education: N/A   Occupational History  . Not on file.   Social History Main Topics  . Smoking status: Never Smoker   . Smokeless tobacco: Not on file  . Alcohol Use: No  . Drug Use: No  . Sexually Active: Not on file   Other Topics Concern  . Not on file   Social History Narrative  . No narrative on file    Review of Systems:  All systems reviewed.  They are negative to the above problem except as previously stated.  Vital Signs: BP 133/64  Pulse 63  Ht 5\' 2"  (1.575 m)  Wt 257 lb (116.574 kg)  BMI 46.99 kg/m2  SpO2 93%  Physical Exam Patient is in NAD HEENT:  Normocephalic, atraumatic. EOMI, PERRLA.  Neck: JVP is normal.  No bruits.  Lungs: Patient with pops, some decreased airflow and wheezing on forced expiration. Heart: Regular rate and rhythm. Normal S1, S2. No S3.   No significant murmurs. PMI not displaced.  Abdomen:  Supple, nontender. Normal bowel sounds. No masses. No hepatomegaly.  Extremities:   Good distal pulses throughout. 1+ lower extremity edema.  Musculoskeletal :moving all extremities.  Neuro:   alert and  oriented x3.  CN II-XII grossly intact.  EKG  SR 67.    Assessment and Plan:  `Patinet is a 62 yo who is referred for evaluation of SOB and edema.  She has not felt well for over 1 year. Recently completed course of po ABX and steroids.  Still not feeling well.  Tired.  Poor breathing has prevented her from using her CPAP  ON exam, patient with very harsh cough  Some wheezing (more upper airway) as well as pops  She also has some LE edema  I would recomm Labs(BMET, BNP) as well as an echo to evaluate systolic/diastolic function I would recomm she switch from Lisinopril/HCTZ to Cozaar 100 I have prescribed omeprazole bid I have also recomm she be seen in pulmonary (appt made with Jerilee Hoh on Wed) Will be in touch with her re lab results/echo results as far as any modifications in lasix.  2.  HTN  Follow  3.  Anemia.  I did not check  CBC today  Hgb in April was 10.0  Will need to be followed.

## 2012-06-17 ENCOUNTER — Encounter: Payer: Self-pay | Admitting: Internal Medicine

## 2012-06-17 ENCOUNTER — Institutional Professional Consult (permissible substitution): Payer: 59 | Admitting: Internal Medicine

## 2012-06-17 LAB — BASIC METABOLIC PANEL
CO2: 33 mEq/L — ABNORMAL HIGH (ref 19–32)
Calcium: 9 mg/dL (ref 8.4–10.5)
Chloride: 103 mEq/L (ref 96–112)
Glucose, Bld: 86 mg/dL (ref 70–99)
Sodium: 146 mEq/L — ABNORMAL HIGH (ref 135–145)

## 2012-06-18 ENCOUNTER — Encounter: Payer: Self-pay | Admitting: Internal Medicine

## 2012-06-18 ENCOUNTER — Ambulatory Visit (INDEPENDENT_AMBULATORY_CARE_PROVIDER_SITE_OTHER): Payer: 59 | Admitting: Internal Medicine

## 2012-06-18 VITALS — BP 144/70 | HR 73 | Temp 99.1°F | Ht 60.5 in | Wt 262.2 lb

## 2012-06-18 DIAGNOSIS — R059 Cough, unspecified: Secondary | ICD-10-CM | POA: Insufficient documentation

## 2012-06-18 DIAGNOSIS — R05 Cough: Secondary | ICD-10-CM | POA: Insufficient documentation

## 2012-06-18 MED ORDER — PREDNISONE (PAK) 10 MG PO TABS: ORAL_TABLET | ORAL | Status: DC

## 2012-06-18 MED ORDER — TRAMADOL HCL 50 MG PO TABS: ORAL_TABLET | ORAL | Status: DC

## 2012-06-18 MED ORDER — FAMOTIDINE 20 MG PO TABS: ORAL_TABLET | ORAL | Status: DC

## 2012-06-18 NOTE — Progress Notes (Signed)
  Subjective:    Patient ID: Courtney Floyd, female    DOB: [birth year] MRN: [id number]  HPI  62 yowf never smoker no resp problems at all until around mid 2000's with episodes of "bad bronchitis" lasting weeks to months" on ACEi  With persistent cough since indolent onset around June 2013 rx'd as asthma and gerd no better so seen by Dr Tenny Craw 5/19 who rec d/c acei and pulmonary eval  06/18/2012 1st pulmonary eval 2 days after acei stopped no better yet, has daily cough x one year imin productive white mucus daily cough worse in am with choking sensation to point of vomiting / urinating and doe x room to room assoc with hoarseness and sore throat.    No obvious daytime variabilty or assoc cp or chest tightness, subjective wheeze overt sinus or hb symptoms. No unusual exp hx or h/o childhood pna/ asthma or premature birth to her knowledge.   Sleeping ok without nocturnal  or early am exacerbation  of respiratory  c/o's or need for noct saba. Also denies any obvious fluctuation of symptoms with weather or environmental changes or other aggravating or alleviating factors except as outlined above    Review of Systems  Constitutional: Negative for fever, chills and unexpected weight change.  HENT: Positive for congestion, sore throat, dental problem and voice change. Negative for ear pain, nosebleeds, rhinorrhea, sneezing, trouble swallowing, postnasal drip and sinus pressure.   Eyes: Negative for visual disturbance.  Respiratory: Positive for cough and shortness of breath. Negative for choking.   Cardiovascular: Positive for chest pain. Negative for leg swelling.  Gastrointestinal: Negative for vomiting, abdominal pain and diarrhea.  Genitourinary: Negative for difficulty urinating.  Musculoskeletal: Positive for arthralgias.  Skin: Negative for rash.  Neurological: Negative for tremors, syncope and headaches.  Hematological: Does not bruise/bleed easily.       Objective:   Physical  Exam  Hoarse amb wf nad with classic pseudowheeze Wt Readings from Last 3 Encounters:  06/18/12 262 lb 3.2 oz (118.933 kg)  06/16/12 257 lb (116.574 kg)    HEENT: nl dentition, turbinates, and orophanx. Nl external ear canals without cough reflex   NECK :  without JVD/Nodes/TM/ nl carotid upstrokes bilaterally   LUNGS: no acc muscle use, clear to A and P bilaterally without cough on insp or exp maneuvers   CV:  RRR  no s3 or murmur or increase in P2, no edema   ABD:  soft and nontender with nl excursion in the supine position. No bruits or organomegaly, bowel sounds nl  MS:  warm without deformities, calf tenderness, cyanosis or clubbing  SKIN: warm and dry without lesions    NEURO:  alert, approp, no deficits    05/15/12 cxr wnl     Assessment & Plan:

## 2012-06-18 NOTE — Patient Instructions (Addendum)
Prilosec 20 mg Take 30- 60 min before your first and last meals of the day and Pepcid 20 mg at bedtime until cough is gone without the need for cough med  Take mucinex dm 1200 mg  every 12 hours and supplement if needed with  tramadol 50 mg up to 2 every 4 hours to suppress the urge to cough. Swallowing water or using ice chips/non mint and menthol containing candies (such as lifesavers or sugarless jolly ranchers) are also effective.  You should rest your voice and avoid activities that you know make you cough.  Once you have eliminated the cough for 3 straight days try reducing the tramadol first,  then the mucinex dm as tolerated.    Prednisone 10 mg take  4 each am x 2 days,   2 each am x 2 days,  1 each am x2days and stop   GERD (REFLUX)  is an extremely common cause of respiratory symptoms, many times with no significant heartburn at all.    It can be treated with medication, but also with lifestyle changes including avoidance of late meals, excessive alcohol, smoking cessation, and avoid fatty foods, chocolate, peppermint, colas, red wine, and acidic juices such as orange juice.  NO MINT OR MENTHOL PRODUCTS SO NO COUGH DROPS  USE SUGARLESS CANDY INSTEAD (jolley ranchers or Stover's)  NO OIL BASED VITAMINS - use powdered substitutes.  Return in two weeks if not satisfied

## 2012-06-19 ENCOUNTER — Telehealth: Payer: Self-pay | Admitting: Internal Medicine

## 2012-06-19 ENCOUNTER — Ambulatory Visit (HOSPITAL_COMMUNITY)
Admission: RE | Admit: 2012-06-19 | Discharge: 2012-06-19 | Disposition: A | Discharge status: Home / Self Care | Payer: PRIVATE HEALTH INSURANCE | Source: Ambulatory Visit | Attending: Internal Medicine | Admitting: Internal Medicine

## 2012-06-19 DIAGNOSIS — I517 Cardiomegaly: Secondary | ICD-10-CM

## 2012-06-19 DIAGNOSIS — R0609 Other forms of dyspnea: Secondary | ICD-10-CM | POA: Insufficient documentation

## 2012-06-19 DIAGNOSIS — R0989 Other specified symptoms and signs involving the circulatory and respiratory systems: Secondary | ICD-10-CM | POA: Insufficient documentation

## 2012-06-19 DIAGNOSIS — I10 Essential (primary) hypertension: Secondary | ICD-10-CM | POA: Insufficient documentation

## 2012-06-19 DIAGNOSIS — R0602 Shortness of breath: Secondary | ICD-10-CM

## 2012-06-19 NOTE — Assessment & Plan Note (Signed)
Classic Upper airway cough syndrome, so named because it's frequently impossible to sort out how much is  CR/sinusitis with freq throat clearing (which can be related to primary GERD)   vs  causing  secondary (" extra esophageal")  GERD from wide swings in gastric pressure that occur with throat clearing, often  promoting self use of mint and menthol lozenges that reduce the lower esophageal sphincter tone and exacerbate the problem further in a cyclical fashion.   These are the same pts (now being labeled as having "irritable larynx syndrome" by some cough centers) who not infrequently have a history of having failed to tolerate ace inhibitors,  dry powder inhalers or biphosphonates or report having atypical reflux symptoms that don't respond to standard doses of PPI , and are easily confused as having aecopd or asthma flares by even experienced allergists/ pulmonologists.   rec continue off acei, shut down cyclical cough and rx gerd then regroup if not resolved  No evidence of asthma or copd

## 2012-06-19 NOTE — Telephone Encounter (Signed)
Aware but only plan to use tramadol acutely short term If any agitation or tremor should stop it

## 2012-06-19 NOTE — Telephone Encounter (Signed)
I called pharmacy and is aware. Nothing further was needed

## 2012-06-19 NOTE — Progress Notes (Signed)
*  PRELIMINARY RESULTS* Echocardiogram 2D Echocardiogram has been performed.  Courtney Floyd 06/19/2012, 3:14 PM

## 2012-06-19 NOTE — Telephone Encounter (Signed)
Called and spoke with pharmacist and they stated that the tramadol and the celexa have interactions with each other and they wanted to make sure that MW wanted the pt to have the tramadol.  MW please advise.  Thanks  Allergies  Allergen Reactions  . Codeine Itching

## 2012-06-20 ENCOUNTER — Encounter: Payer: Self-pay | Admitting: Vascular Surgery

## 2012-06-24 ENCOUNTER — Encounter: Payer: Self-pay | Admitting: Vascular Surgery

## 2012-06-24 ENCOUNTER — Encounter (INDEPENDENT_AMBULATORY_CARE_PROVIDER_SITE_OTHER): Payer: 59 | Admitting: *Deleted

## 2012-06-24 ENCOUNTER — Ambulatory Visit (INDEPENDENT_AMBULATORY_CARE_PROVIDER_SITE_OTHER): Payer: 59 | Admitting: Vascular Surgery

## 2012-06-24 VITALS — BP 151/51 | HR 64 | Resp 18 | Ht 61.5 in | Wt 263.1 lb

## 2012-06-24 DIAGNOSIS — M79609 Pain in unspecified limb: Secondary | ICD-10-CM

## 2012-06-24 DIAGNOSIS — M7989 Other specified soft tissue disorders: Secondary | ICD-10-CM

## 2012-06-24 NOTE — Progress Notes (Signed)
[facility]   Patient name: Courtney Floyd MRN: [id number] DOB: 1950-06-29 Sex: female     Reason for referral:  Chief Complaint  Patient presents with  . New Evaluation    bilateral leg pain and swelling  R>L leg pain    L>R swelling    HISTORY OF PRESENT ILLNESS: The patient presents today for evaluation of bilateral lower surety swelling and discomfort. She returns had progressive lower surety pain and swelling. This is worse with prolonged standing that can occur well today. She does report discomfort with walking with pain in both calves as well. She does have a history of prior right knee replacement. She does not have any history of DVT. She has been on diuretic therapy improvement of her lower surety swelling which was quite severe. She does not have any history of congestive heart failure. Cardiac workup is underway.  Past Medical History  Diagnosis Date  . Hypertension   . Diabetes   . Breast cancer   . OSA (obstructive sleep apnea)   . RLS (restless legs syndrome)   . Hyperlipidemia     Past Surgical History  Procedure Laterality Date  . Tonsillectomy  1958  . Tubal ligation  1974  . Cesarean section  1972  . Carpal tunnel release Left 1994  . Partial hysterectomy  2006  . Back surgery  2008  . Knee surgery Right   . Total knee arthroplasty Right 2008  . Breast lumpectomy  2012  . Cholecystectomy    . Carpal tunnel release Right 1975    History   Social History  . Marital Status: Married    Spouse Name: N/A    Number of Children: 2  . Years of Education: N/A   Occupational History  . Disabled    Social History Main Topics  . Smoking status: Never Smoker   . Smokeless tobacco: Never Used  . Alcohol Use: No  . Drug Use: No  . Sexually Active: Not on file   Other Topics Concern  . Not on file   Social History Narrative  . No narrative on file    Family History  Problem Relation Age of Onset  . Asthma Father    . Heart disease Father   . Colon cancer Father   . Skin cancer Father   . Colon cancer Mother 51  . Breast cancer Mother 43  . Colon cancer Brother   . Colon cancer Brother   . Hodgkin's lymphoma Son     Allergies as of 06/24/2012 - Review Complete 06/24/2012  Allergen Reaction Noted  . Codeine Itching 06/16/2012    Current Outpatient Prescriptions on File Prior to Visit  Medication Sig Dispense Refill  . citalopram (CELEXA) 40 MG tablet Take 40 mg by mouth daily.       . diclofenac (VOLTAREN) 75 MG EC tablet Take 75 mg by mouth 2 (two) times daily.      . famotidine (PEPCID) 20 MG tablet One at bedtime      . furosemide (LASIX) 80 MG tablet Take 80 mg by mouth daily.       Marland Kitchen gabapentin (NEURONTIN) 300 MG capsule TAKE 1 CAPSULE BY MOUTH EVERY NIGHT AT BEDTIME , INCREASE TO 1 CAPSULE THREE TIMES DAILY AS TOLERATED  270 capsule  1  . levothyroxine (SYNTHROID, LEVOTHROID) 75 MCG tablet Take 75 mcg by mouth daily before breakfast.       . losartan (COZAAR) 50 MG tablet Take 1  tablet (50 mg total) by mouth daily.  90 tablet  3  . metFORMIN (GLUCOPHAGE-XR) 500 MG 24 hr tablet Take 1,000 mg by mouth daily with breakfast.      . nitroGLYCERIN (NITROSTAT) 0.4 MG SL tablet Place 0.4 mg under the tongue every 5 (five) minutes as needed for chest pain.      Marland Kitchen omeprazole (PRILOSEC) 20 MG capsule Take 1 capsule (20 mg total) by mouth 2 (two) times daily.  180 capsule  3  . predniSONE (STERAPRED UNI-PAK) 10 MG tablet Prednisone 10 mg take  4 each am x 2 days,   2 each am x 2 days,  1 each am x2days and stop  14 tablet  0  . rOPINIRole (REQUIP) 2 MG tablet TAKE 2 TABLETS BY MOUTH EVERY MORNING , 1 TABLET DAILY AT NOON, AND 2 TABLETS AT BEDTIME  450 tablet  1  . tamoxifen (NOLVADEX) 20 MG tablet Take 20 mg by mouth daily.       . traMADol (ULTRAM) 50 MG tablet 1-2 every 4 hours as needed for cough or pain  40 tablet  0   No current facility-administered medications on file prior to visit.      REVIEW OF SYSTEMS:  Positives indicated with an "X"  CARDIOVASCULAR:  [ ]  chest pain   [x ] chest pressure   [ ]  palpitations   [x ] orthopnea   [x ] dyspnea on exertion   [ ]  claudication   [ ]  rest pain   [ ]  DVT   [ ]  phlebitis PULMONARY:   [x ] productive cough   [ ]  asthma   [x ] wheezing NEUROLOGIC:   [x ] weakness  [ ]  paresthesias  [ ]  aphasia  [ ]  amaurosis  [ ]  dizziness HEMATOLOGIC:   [ ]  bleeding problems   [ ]  clotting disorders MUSCULOSKELETAL:  [ ]  joint pain   [ ]  joint swelling GASTROINTESTINAL: [ ]   blood in stool  [ ]   hematemesis GENITOURINARY:  [ ]   dysuria  [ ]   hematuria PSYCHIATRIC:  [ ]  history of major depression INTEGUMENTARY:  [ ]  rashes  [ ]  ulcers CONSTITUTIONAL:  [ ]  fever   [ ]  chills  PHYSICAL EXAMINATION:  General: The patient is a well-nourished female, in no acute distress. Vital signs are BP 151/51  Pulse 64  Resp 18  Ht 5' 1.5" (1.562 m)  Wt 263 lb 1.6 oz (119.341 kg)  BMI 48.91 kg/m2 Pulmonary: There is a good air exchange bilaterally without wheezing or rales. Abdomen: Soft and non-tender with normal pitch bowel sounds. Musculoskeletal: There are no major deformities.  There is no significant extremity pain. Neurologic: No focal weakness or paresthesias are detected, Skin: There are no ulcer or rashes noted. Psychiatric: The patient has normal affect. Cardiovascular: There is a regular rate and rhythm without significant murmur appreciated. Mild bilateral lower surety pitting edema Pulse status: Plus radial and 2+ dorsalis pedis pulses bilaterally  [facility] Vascular Lab Studies:  Ordered and Independently Reviewed lower surety venous duplex reveals no evidence of DVT or superficial thrombophlebitis. Reflux noted in bilateral common femoral veins. Otherwise no evidence of deep or superficial venous reflux  Impression and Plan:  Lower extremity edema. The more related to fluid overload with no evidence of significant venous pathology.  She does not have any evidence of arterial insufficiency. She was reassured that this discussion will see [place] again on an as-needed basis    Ramell Wacha [facility] [place] Office: [phone]

## 2012-06-25 ENCOUNTER — Telehealth: Payer: Self-pay | Admitting: *Deleted

## 2012-06-25 NOTE — Progress Notes (Signed)
Attempted to leave message.  Voicemail not working.

## 2012-06-25 NOTE — Telephone Encounter (Signed)
Spoke to pt to advise results/instructions. Pt understood.FOR ECHO RESULTS AS FOLLOWS:  Notes Recorded by Pricilla Riffle, MD on 06/23/2012 at 11:41 PM Echo is normal.

## 2012-07-03 ENCOUNTER — Ambulatory Visit: Payer: PRIVATE HEALTH INSURANCE | Admitting: Cardiology

## 2012-08-19 DIAGNOSIS — L03116 Cellulitis of left lower limb: Secondary | ICD-10-CM | POA: Diagnosis not present

## 2012-08-19 DIAGNOSIS — L03119 Cellulitis of unspecified part of limb: Secondary | ICD-10-CM | POA: Diagnosis not present

## 2012-08-19 DIAGNOSIS — L03115 Cellulitis of right lower limb: Secondary | ICD-10-CM | POA: Diagnosis not present

## 2012-09-30 ENCOUNTER — Encounter (HOSPITAL_COMMUNITY): Payer: Self-pay | Admitting: Cardiology

## 2012-09-30 ENCOUNTER — Emergency Department (HOSPITAL_COMMUNITY): Payer: PRIVATE HEALTH INSURANCE

## 2012-09-30 ENCOUNTER — Inpatient Hospital Stay (HOSPITAL_COMMUNITY)
Admission: EM | Admit: 2012-09-30 | Discharge: 2012-10-05 | DRG: 556 | Disposition: A | Discharge status: Home / Self Care | Payer: PRIVATE HEALTH INSURANCE | Attending: Internal Medicine | Admitting: Internal Medicine

## 2012-09-30 DIAGNOSIS — Z79899 Other long term (current) drug therapy: Secondary | ICD-10-CM

## 2012-09-30 DIAGNOSIS — R059 Cough, unspecified: Secondary | ICD-10-CM

## 2012-09-30 DIAGNOSIS — E119 Type 2 diabetes mellitus without complications: Secondary | ICD-10-CM | POA: Diagnosis present

## 2012-09-30 DIAGNOSIS — E876 Hypokalemia: Secondary | ICD-10-CM | POA: Diagnosis present

## 2012-09-30 DIAGNOSIS — I10 Essential (primary) hypertension: Secondary | ICD-10-CM | POA: Diagnosis present

## 2012-09-30 DIAGNOSIS — M7989 Other specified soft tissue disorders: Principal | ICD-10-CM | POA: Diagnosis present

## 2012-09-30 DIAGNOSIS — Z9119 Patient's noncompliance with other medical treatment and regimen: Secondary | ICD-10-CM

## 2012-09-30 DIAGNOSIS — N390 Urinary tract infection, site not specified: Secondary | ICD-10-CM | POA: Diagnosis not present

## 2012-09-30 DIAGNOSIS — L03119 Cellulitis of unspecified part of limb: Secondary | ICD-10-CM

## 2012-09-30 DIAGNOSIS — R05 Cough: Secondary | ICD-10-CM

## 2012-09-30 DIAGNOSIS — G4733 Obstructive sleep apnea (adult) (pediatric): Secondary | ICD-10-CM | POA: Diagnosis present

## 2012-09-30 DIAGNOSIS — R609 Edema, unspecified: Secondary | ICD-10-CM | POA: Diagnosis present

## 2012-09-30 DIAGNOSIS — Z6841 Body Mass Index (BMI) 40.0 and over, adult: Secondary | ICD-10-CM

## 2012-09-30 DIAGNOSIS — I890 Lymphedema, not elsewhere classified: Secondary | ICD-10-CM | POA: Diagnosis present

## 2012-09-30 DIAGNOSIS — R0602 Shortness of breath: Secondary | ICD-10-CM | POA: Diagnosis present

## 2012-09-30 DIAGNOSIS — Z91199 Patient's noncompliance with other medical treatment and regimen due to unspecified reason: Secondary | ICD-10-CM

## 2012-09-30 DIAGNOSIS — C50919 Malignant neoplasm of unspecified site of unspecified female breast: Secondary | ICD-10-CM | POA: Diagnosis present

## 2012-09-30 DIAGNOSIS — E662 Morbid (severe) obesity with alveolar hypoventilation: Secondary | ICD-10-CM | POA: Diagnosis present

## 2012-09-30 DIAGNOSIS — T502X5A Adverse effect of carbonic-anhydrase inhibitors, benzothiadiazides and other diuretics, initial encounter: Secondary | ICD-10-CM | POA: Diagnosis not present

## 2012-09-30 DIAGNOSIS — Z96659 Presence of unspecified artificial knee joint: Secondary | ICD-10-CM

## 2012-09-30 DIAGNOSIS — G609 Hereditary and idiopathic neuropathy, unspecified: Secondary | ICD-10-CM | POA: Diagnosis present

## 2012-09-30 DIAGNOSIS — E039 Hypothyroidism, unspecified: Secondary | ICD-10-CM | POA: Diagnosis present

## 2012-09-30 DIAGNOSIS — B958 Unspecified staphylococcus as the cause of diseases classified elsewhere: Secondary | ICD-10-CM | POA: Diagnosis not present

## 2012-09-30 DIAGNOSIS — E669 Obesity, unspecified: Secondary | ICD-10-CM | POA: Diagnosis present

## 2012-09-30 DIAGNOSIS — F329 Major depressive disorder, single episode, unspecified: Secondary | ICD-10-CM | POA: Diagnosis present

## 2012-09-30 DIAGNOSIS — G2581 Restless legs syndrome: Secondary | ICD-10-CM | POA: Diagnosis present

## 2012-09-30 DIAGNOSIS — F3289 Other specified depressive episodes: Secondary | ICD-10-CM | POA: Diagnosis present

## 2012-09-30 DIAGNOSIS — E785 Hyperlipidemia, unspecified: Secondary | ICD-10-CM | POA: Diagnosis present

## 2012-09-30 DIAGNOSIS — D649 Anemia, unspecified: Secondary | ICD-10-CM | POA: Diagnosis present

## 2012-09-30 DIAGNOSIS — G4700 Insomnia, unspecified: Secondary | ICD-10-CM | POA: Diagnosis not present

## 2012-09-30 DIAGNOSIS — N179 Acute kidney failure, unspecified: Secondary | ICD-10-CM | POA: Diagnosis not present

## 2012-09-30 DIAGNOSIS — I872 Venous insufficiency (chronic) (peripheral): Secondary | ICD-10-CM | POA: Diagnosis present

## 2012-09-30 LAB — CBC WITH DIFFERENTIAL/PLATELET
Basophils Absolute: 0.1 10*3/uL (ref 0.0–0.1)
Basophils Relative: 0 % (ref 0–1)
Eosinophils Relative: 3 % (ref 0–5)
HCT: 32.8 % — ABNORMAL LOW (ref 36.0–46.0)
Hemoglobin: 10.6 g/dL — ABNORMAL LOW (ref 12.0–15.0)
MCH: 29 pg (ref 26.0–34.0)
MCHC: 32.3 g/dL (ref 30.0–36.0)
MCV: 89.6 fL (ref 78.0–100.0)
Monocytes Absolute: 0.8 10*3/uL (ref 0.1–1.0)
Monocytes Relative: 7 % (ref 3–12)
RDW: 15.3 % (ref 11.5–15.5)

## 2012-09-30 LAB — COMPREHENSIVE METABOLIC PANEL
ALT: 30 U/L (ref 0–35)
Alkaline Phosphatase: 75 U/L (ref 39–117)
GFR calc Af Amer: >90 mL/min (ref >90)
Total Bilirubin: 0.3 mg/dL (ref 0.3–1.2)
Total Protein: 6.7 g/dL (ref 6.0–8.3)

## 2012-09-30 LAB — URINE MICROSCOPIC-ADD ON

## 2012-09-30 LAB — URINALYSIS, ROUTINE W REFLEX MICROSCOPIC
Bilirubin Urine: NEGATIVE
Ketones, ur: NEGATIVE mg/dL
Nitrite: NEGATIVE
pH: 8 (ref 5.0–8.0)

## 2012-09-30 MED ORDER — VANCOMYCIN HCL IN DEXTROSE 1-5 GM/200ML-% IV SOLN
1000.0000 mg | Freq: Once | INTRAVENOUS | Status: DC
  Filled 2012-09-30: qty 200

## 2012-09-30 NOTE — ED Notes (Signed)
Pt reports swelling to bilateral legs over the past couple of months. States that she has been to see a heart MD and had other work-ups. States that over the past couple of days the swelling has been worse, and her legs are weeping and painful. States that she takes lasix 80mg  pill daily. Denies any other symptoms at this time. Skin warm and dry.

## 2012-09-30 NOTE — ED Notes (Signed)
IV placement attempted x2 without success, additional RN asked to establish IV access.

## 2012-09-30 NOTE — ED Notes (Signed)
Pt c/o bilateral lower leg swelling, worsening the past 2 weeks, sob and right knee pain. Pt reports she has been to multiple doctors but doesn't feel like she is getting any answers. Was recently at her PCP last week and was told to take 2 lasix pills instead of just 1. Pt denies any extra relief since taking the 2 pills, hasn't taken them today. Pt reports she is sob at all times, worsens with exertion and laying down flat. Pt also sts she has restless leg syndrome and would like a new medication because her isn't working and can't get any sleep. Pt in nad, skin warm and dry, resp e/u.

## 2012-09-30 NOTE — ED Provider Notes (Signed)
CSN: [id number]     Arrival date & time 09/30/12  1525 History   First MD Initiated Contact with Patient 09/30/12 1824     Chief Complaint  Patient presents with  . Leg Swelling   (Consider location/radiation/quality/duration/timing/severity/associated sxs/prior Treatment) Patient is a 62 y.o. female presenting with shortness of breath.  Shortness of Breath Severity:  Mild Onset quality:  Gradual Progression:  Waxing and waning Chronicity:  Recurrent Exacerbated by: laying down. Associated symptoms: PND   Associated symptoms comment:  Lower extremity swelling bilaterally, swollen abdomen, chest tightness  Patient with gradually increasing lower extremity swelling over the last several months, has been evaluated by her PCP and cardiology.  Despite increase in lasix, swelling is continuing to worsen, now associated with generalized edema to abdomen, orthopnea. Past Medical History  Diagnosis Date  . Hypertension   . Diabetes   . Breast cancer   . OSA (obstructive sleep apnea)   . RLS (restless legs syndrome)   . Hyperlipidemia    Past Surgical History  Procedure Laterality Date  . Tonsillectomy  1958  . Tubal ligation  1974  . Cesarean section  1972  . Carpal tunnel release Left 1994  . Partial hysterectomy  2006  . Back surgery  2008  . Knee surgery Right   . Total knee arthroplasty Right 2008  . Breast lumpectomy  2012  . Cholecystectomy    . Carpal tunnel release Right 1975   Family History  Problem Relation Age of Onset  . Asthma Father   . Heart disease Father   . Colon cancer Father   . Skin cancer Father   . Colon cancer Mother 22  . Breast cancer Mother 27  . Colon cancer Brother   . Colon cancer Brother   . Hodgkin's lymphoma Son    History  Substance Use Topics  . Smoking status: Never Smoker   . Smokeless tobacco: Never Used  . Alcohol Use: No   OB History   Grav Para Term Preterm Abortions TAB SAB Ect Mult Living                 Review of  Systems  Respiratory: Positive for shortness of breath.   Cardiovascular: Positive for leg swelling and PND.  Musculoskeletal: Positive for arthralgias.  Skin: Positive for color change.  All other systems reviewed and are negative.    Allergies  Codeine  Home Medications   Current Outpatient Rx  Name  Route  Sig  Dispense  Refill  . citalopram (CELEXA) 40 MG tablet   Oral   Take 40 mg by mouth every morning.          . diclofenac (VOLTAREN) 75 MG EC tablet   Oral   Take 75 mg by mouth 2 (two) times daily.         . furosemide (LASIX) 80 MG tablet   Oral   Take 80 mg by mouth 2 (two) times daily.          Marland Kitchen gabapentin (NEURONTIN) 300 MG capsule   Oral   Take 300 mg by mouth at bedtime.         Marland Kitchen levothyroxine (SYNTHROID, LEVOTHROID) 75 MCG tablet   Oral   Take 75 mcg by mouth daily before breakfast.          . losartan (COZAAR) 50 MG tablet   Oral   Take 1 tablet (50 mg total) by mouth daily.   90 tablet   3   .  metFORMIN (GLUCOPHAGE-XR) 500 MG 24 hr tablet   Oral   Take 1,000 mg by mouth at bedtime.          Marland Kitchen rOPINIRole (REQUIP) 2 MG tablet   Oral   Take 2-4 mg by mouth 3 (three) times daily. Takes 4mg  in the morning, 2mg  at noon & 4mg  at bedtime         . tamoxifen (NOLVADEX) 20 MG tablet   Oral   Take 20 mg by mouth at bedtime.          . nitroGLYCERIN (NITROSTAT) 0.4 MG SL tablet   Sublingual   Place 0.4 mg under the tongue every 5 (five) minutes as needed for chest pain.          BP 156/44  Pulse 76  Temp(Src) 98.4 F (36.9 C) (Oral)  Resp 20  SpO2 95% Physical Exam  Nursing note and vitals reviewed. Constitutional: She is oriented to person, place, and time. She appears well-developed and well-nourished.  HENT:  Head: Normocephalic.  Eyes: Pupils are equal, round, and reactive to light.  Neck: Normal range of motion.  Cardiovascular: Normal rate and regular rhythm.   Pulmonary/Chest: Effort normal.  Abdominal: Soft.  She exhibits distension.  Musculoskeletal: She exhibits edema and tenderness.  Lymphadenopathy:    She has no cervical adenopathy.  Neurological: She is alert and oriented to person, place, and time.  Skin: Skin is warm and dry.  Psychiatric: She has a normal mood and affect. Her behavior is normal. Judgment and thought content normal.    ED Course  Procedures (including critical care time) Labs Review Labs Reviewed  CBC WITH DIFFERENTIAL  COMPREHENSIVE METABOLIC PANEL  URINALYSIS, ROUTINE W REFLEX MICROSCOPIC   Date: 09/30/2012  Rate: 68  Rhythm: normal sinus rhythm  QRS Axis: normal  Intervals: normal  ST/T Wave abnormalities: normal  Conduction Disutrbances:none  Narrative Interpretation: NSR  Old EKG Reviewed: none available  Imaging Review No results found. Lower extremities edematous, red, warm to touch.  Appears to be cellulitis with lymphangitis extending above knees to thighs. Patient discussed with and seen by Dr. Freida Busman. Will admit for IV antibiotics.  Admitted to hospitalist for unassigned medicine. MDM   Lower extremity edema. ? Cellulitis.     Jimmye Norman, NP 10/01/12 [phone]

## 2012-09-30 NOTE — ED Notes (Signed)
Admitting at bedside 

## 2012-10-01 DIAGNOSIS — L02419 Cutaneous abscess of limb, unspecified: Secondary | ICD-10-CM

## 2012-10-01 DIAGNOSIS — M7989 Other specified soft tissue disorders: Principal | ICD-10-CM

## 2012-10-01 DIAGNOSIS — R05 Cough: Secondary | ICD-10-CM

## 2012-10-01 DIAGNOSIS — E119 Type 2 diabetes mellitus without complications: Secondary | ICD-10-CM | POA: Diagnosis present

## 2012-10-01 DIAGNOSIS — R059 Cough, unspecified: Secondary | ICD-10-CM

## 2012-10-01 DIAGNOSIS — E785 Hyperlipidemia, unspecified: Secondary | ICD-10-CM | POA: Diagnosis present

## 2012-10-01 DIAGNOSIS — G4733 Obstructive sleep apnea (adult) (pediatric): Secondary | ICD-10-CM | POA: Diagnosis present

## 2012-10-01 DIAGNOSIS — I10 Essential (primary) hypertension: Secondary | ICD-10-CM | POA: Diagnosis present

## 2012-10-01 DIAGNOSIS — R609 Edema, unspecified: Secondary | ICD-10-CM

## 2012-10-01 LAB — IRON AND TIBC
Iron: 58 ug/dL (ref 42–135)
Saturation Ratios: 13 % — ABNORMAL LOW (ref 20–55)
TIBC: 443 ug/dL (ref 250–470)

## 2012-10-01 LAB — GLUCOSE, CAPILLARY: Glucose-Capillary: 144 mg/dL — ABNORMAL HIGH (ref 70–99)

## 2012-10-01 LAB — CBC
Hemoglobin: 10.4 g/dL — ABNORMAL LOW (ref 12.0–15.0)
MCH: 28.7 pg (ref 26.0–34.0)
Platelets: 222 10*3/uL (ref 150–400)
RBC: 3.63 MIL/uL — ABNORMAL LOW (ref 3.87–5.11)
WBC: 10.7 10*3/uL — ABNORMAL HIGH (ref 4.0–10.5)

## 2012-10-01 LAB — MAGNESIUM: Magnesium: 2 mg/dL (ref 1.5–2.5)

## 2012-10-01 LAB — BASIC METABOLIC PANEL
CO2: 30 mEq/L (ref 19–32)
Chloride: 101 mEq/L (ref 96–112)
Glucose, Bld: 143 mg/dL — ABNORMAL HIGH (ref 70–99)
Sodium: 141 mEq/L (ref 135–145)

## 2012-10-01 LAB — FOLATE: Folate: >20 ng/mL

## 2012-10-01 LAB — TROPONIN I: Troponin I: <0.3 ng/mL (ref <0.30)

## 2012-10-01 LAB — VITAMIN B12: Vitamin B-12: 462 pg/mL (ref 211–911)

## 2012-10-01 MED ORDER — NITROGLYCERIN 0.4 MG SL SUBL: 0.4000 mg | SUBLINGUAL_TABLET | SUBLINGUAL | Status: DC | PRN

## 2012-10-01 MED ORDER — ACETAMINOPHEN 325 MG PO TABS
650.0000 mg | ORAL_TABLET | Freq: Four times a day (QID) | ORAL | Status: DC | PRN
  Administered 2012-10-01 (×2): 650 mg via ORAL
  Filled 2012-10-01 (×2): qty 2

## 2012-10-01 MED ORDER — POTASSIUM CHLORIDE CRYS ER 20 MEQ PO TBCR
40.0000 meq | EXTENDED_RELEASE_TABLET | Freq: Four times a day (QID) | ORAL | Status: AC
  Administered 2012-10-01 (×2): 40 meq via ORAL
  Filled 2012-10-01: qty 2

## 2012-10-01 MED ORDER — FUROSEMIDE 10 MG/ML IJ SOLN
60.0000 mg | Freq: Two times a day (BID) | INTRAMUSCULAR | Status: DC
  Administered 2012-10-01 – 2012-10-02 (×4): 60 mg via INTRAVENOUS
  Filled 2012-10-01 (×5): qty 6

## 2012-10-01 MED ORDER — LEVOTHYROXINE SODIUM 75 MCG PO TABS
75.0000 ug | ORAL_TABLET | Freq: Every day | ORAL | Status: DC
  Administered 2012-10-01 – 2012-10-05 (×5): 75 ug via ORAL
  Filled 2012-10-01 (×6): qty 1

## 2012-10-01 MED ORDER — CITALOPRAM HYDROBROMIDE 40 MG PO TABS
40.0000 mg | ORAL_TABLET | Freq: Every morning | ORAL | Status: DC
  Administered 2012-10-01 – 2012-10-05 (×5): 40 mg via ORAL
  Filled 2012-10-01 (×5): qty 1

## 2012-10-01 MED ORDER — GUAIFENESIN-DM 100-10 MG/5ML PO SYRP: 5.0000 mL | ORAL_SOLUTION | ORAL | Status: DC | PRN

## 2012-10-01 MED ORDER — ENOXAPARIN SODIUM 40 MG/0.4ML ~~LOC~~ SOLN
40.0000 mg | SUBCUTANEOUS | Status: DC
  Administered 2012-10-01 – 2012-10-05 (×5): 40 mg via SUBCUTANEOUS
  Filled 2012-10-01 (×5): qty 0.4

## 2012-10-01 MED ORDER — ROPINIROLE HCL 1 MG PO TABS
4.0000 mg | ORAL_TABLET | Freq: Two times a day (BID) | ORAL | Status: DC
  Administered 2012-10-01 – 2012-10-05 (×10): 4 mg via ORAL
  Filled 2012-10-01 (×11): qty 4

## 2012-10-01 MED ORDER — ROPINIROLE HCL 1 MG PO TABS
2.0000 mg | ORAL_TABLET | Freq: Every day | ORAL | Status: DC
  Administered 2012-10-01 – 2012-10-05 (×5): 2 mg via ORAL
  Filled 2012-10-01 (×5): qty 2

## 2012-10-01 MED ORDER — INSULIN ASPART 100 UNIT/ML ~~LOC~~ SOLN
0.0000 [IU] | Freq: Three times a day (TID) | SUBCUTANEOUS | Status: DC
  Administered 2012-10-01 – 2012-10-04 (×7): 1 [IU] via SUBCUTANEOUS
  Administered 2012-10-04: 2 [IU] via SUBCUTANEOUS

## 2012-10-01 MED ORDER — ONDANSETRON HCL 4 MG PO TABS: 4.0000 mg | ORAL_TABLET | Freq: Four times a day (QID) | ORAL | Status: DC | PRN

## 2012-10-01 MED ORDER — ACETAMINOPHEN 650 MG RE SUPP: 650.0000 mg | Freq: Four times a day (QID) | RECTAL | Status: DC | PRN

## 2012-10-01 MED ORDER — TAMOXIFEN CITRATE 10 MG PO TABS
20.0000 mg | ORAL_TABLET | Freq: Every day | ORAL | Status: DC
  Administered 2012-10-01 – 2012-10-04 (×4): 20 mg via ORAL
  Filled 2012-10-01 (×5): qty 2

## 2012-10-01 MED ORDER — ONDANSETRON HCL 4 MG/2ML IJ SOLN: 4.0000 mg | Freq: Four times a day (QID) | INTRAMUSCULAR | Status: DC | PRN

## 2012-10-01 MED ORDER — POTASSIUM CHLORIDE CRYS ER 20 MEQ PO TBCR
40.0000 meq | EXTENDED_RELEASE_TABLET | Freq: Once | ORAL | Status: AC
  Administered 2012-10-01: 40 meq via ORAL
  Filled 2012-10-01: qty 2

## 2012-10-01 MED ORDER — LOSARTAN POTASSIUM 50 MG PO TABS
50.0000 mg | ORAL_TABLET | Freq: Every day | ORAL | Status: DC
  Administered 2012-10-01 – 2012-10-05 (×5): 50 mg via ORAL
  Filled 2012-10-01 (×5): qty 1

## 2012-10-01 MED ORDER — INSULIN ASPART 100 UNIT/ML ~~LOC~~ SOLN: 0.0000 [IU] | Freq: Every day | SUBCUTANEOUS | Status: DC

## 2012-10-01 MED ORDER — GABAPENTIN 300 MG PO CAPS
300.0000 mg | ORAL_CAPSULE | Freq: Every day | ORAL | Status: DC
  Administered 2012-10-01 – 2012-10-04 (×4): 300 mg via ORAL
  Filled 2012-10-01 (×5): qty 1

## 2012-10-01 MED ORDER — ALBUTEROL SULFATE (5 MG/ML) 0.5% IN NEBU: 2.5000 mg | INHALATION_SOLUTION | RESPIRATORY_TRACT | Status: DC | PRN

## 2012-10-01 NOTE — H&P (Signed)
Patient's PCP: Estanislado Pandy, MD  Chief Complaint: Lower extremity swelling and shortness of breath  History of Present Illness: Courtney Floyd is a 62 y.o. Caucasian female with history of hypertension, diabetes, breast cancer on tamoxifen, obstructive sleep apnea recent intolerance to CPAP due to bronchitis, restless leg syndrome, and hyperlipidemia who presents with the above complaints.  Patient reports that she has had chronic edema for many years however over the last 2-3 months she has had increasing lower extremity edema.  She has had extensive workup including an evaluation with cardiology, vascular surgery, and pulmonary.  She has had a 2-D echocardiogram on 06/19/2012 which showed EF of 60-65%, wall motion was normal, no regional wall motion abnormalities.  Since then she has had bouts of bronchitis for which she has required antibiotics and steroids.  Due to her episodes of bronchitis she has not been consistently using her CPAP.  In the same period of time she is noted increasing lower extremity edema and shortness of breath especially laying down flat requiring 2-3 pillows.  Her primary care physician has started her on Lasix and has increased the dose.  As she has not been getting any better at home she presented to the emergency department for further evaluation.  She denies any fevers or chills.  Does complain of some chest tightness, currently denies any shortness of breath.  Denies any abdominal pain or diarrhea.  Denies any vision changes, complaining of right-sided headaches.  Hospitalist service was asked to admit the patient for further care and management.  Review of Systems: All systems reviewed with the patient and positive as per history of present illness, otherwise all other systems are negative.  Past Medical History  Diagnosis Date  . Hypertension   . Diabetes   . Breast cancer   . OSA (obstructive sleep apnea)   . RLS (restless legs syndrome)   . Hyperlipidemia     Past Surgical History  Procedure Laterality Date  . Tonsillectomy  1958  . Tubal ligation  1974  . Cesarean section  1972  . Carpal tunnel release Left 1994  . Partial hysterectomy  2006  . Back surgery  2008  . Knee surgery Right   . Total knee arthroplasty Right 2008  . Breast lumpectomy  2012  . Cholecystectomy    . Carpal tunnel release Right 1975   Family History  Problem Relation Age of Onset  . Asthma Father   . Heart disease Father   . Colon cancer Father   . Skin cancer Father   . Colon cancer Mother 22  . Breast cancer Mother 33  . Colon cancer Brother   . Colon cancer Brother   . Hodgkin's lymphoma Son    History   Social History  . Marital Status: Married    Spouse Name: N/A    Number of Children: 2  . Years of Education: N/A   Occupational History  . Disabled    Social History Main Topics  . Smoking status: Never Smoker   . Smokeless tobacco: Never Used  . Alcohol Use: No  . Drug Use: No  . Sexual Activity: Not on file   Other Topics Concern  . Not on file   Social History Narrative  . No narrative on file   Allergies: Codeine  Home Meds: Prior to Admission medications   Medication Sig Start Date End Date Taking? Authorizing Provider  citalopram (CELEXA) 40 MG tablet Take 40 mg by mouth every morning.  05/28/12  Yes Historical Provider, MD  diclofenac (VOLTAREN) 75 MG EC tablet Take 75 mg by mouth 2 (two) times daily.   Yes Historical Provider, MD  furosemide (LASIX) 80 MG tablet Take 80 mg by mouth 2 (two) times daily.  04/21/12  Yes Historical Provider, MD  gabapentin (NEURONTIN) 300 MG capsule Take 300 mg by mouth at bedtime.   Yes Historical Provider, MD  levothyroxine (SYNTHROID, LEVOTHROID) 75 MCG tablet Take 75 mcg by mouth daily before breakfast.  05/20/12  Yes Historical Provider, MD  losartan (COZAAR) 50 MG tablet Take 1 tablet (50 mg total) by mouth daily. 06/16/12  Yes Pricilla Riffle, MD  metFORMIN (GLUCOPHAGE-XR) 500 MG 24 hr  tablet Take 1,000 mg by mouth at bedtime.    Yes Historical Provider, MD  rOPINIRole (REQUIP) 2 MG tablet Take 2-4 mg by mouth 3 (three) times daily. Takes 4mg  in the morning, 2mg  at noon & 4mg  at bedtime   Yes Historical Provider, MD  tamoxifen (NOLVADEX) 20 MG tablet Take 20 mg by mouth at bedtime.  04/20/12  Yes Historical Provider, MD  nitroGLYCERIN (NITROSTAT) 0.4 MG SL tablet Place 0.4 mg under the tongue every 5 (five) minutes as needed for chest pain.    Historical Provider, MD    Physical Exam: Blood pressure 145/45, pulse 71, temperature 98.4 F (36.9 C), temperature source Oral, resp. rate 21, SpO2 92.00%. General: Awake, Oriented x3, No acute distress. HEENT: EOMI, Moist mucous membranes Neck: Supple CV: S1 and S2 Lungs: Clear to ascultation bilaterally Abdomen: Soft, Nontender, Nondistended, +bowel sounds. Ext: Good pulses.  2+ lower extremity edema. No clubbing or cyanosis noted.  Mild erythema with no clear demarcation in her shin bilaterally, with signs of chronic venous stasis. Neuro: Cranial Nerves II-XII grossly intact. Has 5/5 motor strength in upper and lower extremities.  Lab results:  Recent Labs  09/30/12 1934  NA 144  K 3.4*  CL 103  CO2 31  GLUCOSE 118*  BUN 13  CREATININE 0.66  CALCIUM 9.5    Recent Labs  09/30/12 1934  AST 51*  ALT 30  ALKPHOS 75  BILITOT 0.3  PROT 6.7  ALBUMIN 3.5   No results found for this basename: LIPASE, AMYLASE,  in the last 72 hours  Recent Labs  09/30/12 1934  WBC 11.4*  NEUTROABS 6.7  HGB 10.6*  HCT 32.8*  MCV 89.6  PLT 218   No results found for this basename: CKTOTAL, CKMB, CKMBINDEX, TROPONINI,  in the last 72 hours No components found with this basename: POCBNP,  No results found for this basename: DDIMER,  in the last 72 hours No results found for this basename: HGBA1C,  in the last 72 hours No results found for this basename: CHOL, HDL, LDLCALC, TRIG, CHOLHDL, LDLDIRECT,  in the last 72 hours No  results found for this basename: TSH, T4TOTAL, FREET3, T3FREE, THYROIDAB,  in the last 72 hours No results found for this basename: VITAMINB12, FOLATE, FERRITIN, TIBC, IRON, RETICCTPCT,  in the last 72 hours Imaging results:  Dg Chest 2 View  09/30/2012   *RADIOLOGY REPORT*  Clinical Data: Short of breath  CHEST - 2 VIEW  Comparison: 06/05/2012  Findings: Cardiac enlargement.  Negative for heart failure.  Lungs are clear without infiltrate effusion or mass.  IMPRESSION: No active cardiopulmonary abnormality.   Original Report Authenticated By: Janeece Riggers, M.D.   Other results: EKG: Sinus rhythm with heart rate of 68.  Assessment & Plan by Problem: Bilateral lower extremity swelling Patient's  pro BNP is normal at 80.9.  Had a 2-D echocardiogram on 06/19/2012 which showed ejection fraction of 60-65%, normal wall motion, no regional wall motion abnormalities.  Suspect edema is likely related to noncompliance with CPAP, pro BNP is normal may have mild diastolic heart failure.  Transition by mouth Lasix 80 mg twice daily to Lasix 60 mg IV twice daily.  Monitor electrolytes.  Has had lower extremity Doppler on 06/24/2012, repeat lower extremity Doppler to rule out DVT.  Initially emergency department provide had concern for possible cellulitis, however based on my examination, low suspicious for infectious etiology, hold antibiotics for now.  Patient's erythematous changes in her lower extremities are likely due to chronic venous stasis changes.  Shortness of breath with possible orthopnea Again low suspicion of heart failure causing shortness of breath.  Strongly suspect obstructive sleep apnea causing her symptoms which may be improved with pillows causing improvement in her obstructive symptoms.  Obstructive sleep apnea Strongly suspect patient's bilateral lower extremity swelling and shortness of breath to be caused by patient's obstructive sleep apnea.  Encourage compliance with her CPAP, recommended  she followup with her primary care physician for another sleep study if warranted.  Start CPAP at bedtime.  Hypertension Stable.  Continue home antihypertensive medications.  Restless leg syndrome Continue Requip.  Again may have been worsened due to obstructive sleep apnea symptoms from not using her CPAP.  Will check B12 levels in the morning.  Continue gabapentin.  Hyperlipidemia Stable.  Diabetes Sliding scale insulin.  Diabetic diet.  Hypothyroidism Continue levothyroxin.  History of breast cancer Continue tamoxifen.  Depression Continue Celexa.  Anemia Appears to be chronic.  Check anemia panel in the morning.  Hypokalemia Replace as needed.  Prophylaxis Lovenox  CODE STATUS Full code.  Disposition Admit the patient to telemetry as inpatient.  Patient's daughter updated at bedside.  Time spent on admission, talking to the patient, and coordinating care was: 60 mins.  Janiel Crisostomo A, MD 10/01/2012, 12:18 AM

## 2012-10-01 NOTE — Evaluation (Signed)
Physical Therapy Evaluation Patient Details Name: Courtney Floyd MRN: [id number] DOB: Oct 08, 1950 Today's Date: 10/01/2012 Time: 0865-7846 PT Time Calculation (min): 39 min  PT Assessment / Plan / Recommendation History of Present Illness  62 y.o. female admitted to [facility] on 09/30/12 with history of hypertension, diabetes, breast cancer on tamoxifen, obstructive sleep apnea recent intolerance to CPAP due to bronchitis, restless leg syndrome, and hyperlipidemia who presents with SOB and LE edema.  She has had a 2-D echocardiogram on 06/19/2012 which showed EF of 60-65%.  Since then she has had bouts of bronchitis for which she has required antibiotics and steroids.  In the same period of time she is noted increasing lower extremity edema and shortness of breath especially laying down flat requiring 2-3 pillows.  Her primary care physician has started her on Lasix and has increased the dose.  As she has not been getting any better at home she presented to the emergency department for further evaluation.  Clinical Impression  The pt is mobilizing well, reporting generalized weakness wanting guidance on how to get her legs stronger.  Remote h/o non-operable left leg fx.  Pt reports she is suppose to be TDWB R leg and is demonstrating more of PWB during gait with RW.  Pt is mobilizing safely without external assist.  PT to follow acutely for guidance on HEP and gait.  She did mention difficulty managing her pants and ADLs, so OT assessment recommended.      PT Assessment  Patient needs continued PT services    Follow Up Recommendations  No PT follow up;Supervision - Intermittent    Does the patient have the potential to tolerate intense rehabilitation     NA  Barriers to Discharge Other (comment) (None) None    Equipment Recommendations  None recommended by PT    Recommendations for Other Services OT consult   Frequency Min 3X/week    Precautions / Restrictions Precautions Precautions:  None Restrictions Weight Bearing Restrictions: Yes LLE Weight Bearing: Touchdown weight bearing Other Position/Activity Restrictions: Per pt physician told her to use a RW and "limit" her WB on her left leg.  "Just enough to balance myself"  Functionally pt looks to be PWB not TDWB that she reported she was supposed to be doing.     Pertinent Vitals/Pain See vitals flow sheet.      Mobility  Bed Mobility Bed Mobility: Supine to Sit;Sitting - Scoot to Edge of Bed Supine to Sit: 6: Modified independent (Device/Increase time);With rails;HOB elevated Sitting - Scoot to Edge of Bed: 6: Modified independent (Device/Increase time);With rail Details for Bed Mobility Assistance: used rails to get to sitting, but did so easily Transfers Transfers: Sit to Stand;Stand to Sit Sit to Stand: 5: Supervision;With upper extremity assist;From bed;From chair/3-in-1 Stand to Sit: 5: Supervision;With upper extremity assist;With armrests;To chair/3-in-1 Details for Transfer Assistance: supervision for safety Ambulation/Gait Ambulation/Gait Assistance: 5: Supervision Ambulation Distance (Feet): 150 Feet Assistive device: Rolling walker Ambulation/Gait Assistance Details: limited by bil upper extremity pain and leg weakness.  No knee buckling noted with gait.  Pt relying on her arms to unweight her left leg and it looks like she is more PWB and not TDWB like she is reporting she is supposed to do.   Gait Pattern: Step-to pattern;Antalgic Gait velocity: less than 1.8 ft/sec indicating risk for recurrent falls    Exercises General Exercises - Lower Extremity Long Arc Quad: AROM;Both;10 reps;Seated Hip Flexion/Marching: AROM;Both;10 reps;Seated Toe Raises: AROM;Both;10 reps;Seated Heel Raises: AROM;Both;10 reps;Seated  PT Diagnosis: Difficulty walking;Abnormality of gait;Generalized weakness  PT Problem List: Decreased strength;Decreased activity tolerance;Obesity;Pain PT Treatment Interventions: DME  instruction;Gait training;Stair training;Functional mobility training;Therapeutic activities;Therapeutic exercise;Balance training;Neuromuscular re-education;Patient/family education;Modalities     PT Goals(Current goals can be found in the care plan section) Acute Rehab PT Goals Patient Stated Goal: to go home, get legs stronger PT Goal Formulation: With patient/family Time For Goal Achievement: 10/15/12 Potential to Achieve Goals: Good  Visit Information  Last PT Received On: 10/01/12 Assistance Needed: +1 History of Present Illness: 62 y.o. female admitted to [facility] on 09/30/12 with history of hypertension, diabetes, breast cancer on tamoxifen, obstructive sleep apnea recent intolerance to CPAP due to bronchitis, restless leg syndrome, and hyperlipidemia who presents with SOB and LE edema.  She has had a 2-D echocardiogram on 06/19/2012 which showed EF of 60-65%.  Since then she has had bouts of bronchitis for which she has required antibiotics and steroids.  In the same period of time she is noted increasing lower extremity edema and shortness of breath especially laying down flat requiring 2-3 pillows.  Her primary care physician has started her on Lasix and has increased the dose.  As she has not been getting any better at home she presented to the emergency department for further evaluation.       Prior Functioning  Home Living Family/patient expects to be discharged to:: Private residence Living Arrangements: Spouse/significant other Available Help at Discharge: Family;Available 24 hours/day Type of Home: House Home Access: Stairs to enter [employer] of Steps: 1 Entrance Stairs-Rails: None Home Layout: One level;Other (Comment) (with basement) Home Equipment: Walker - 2 wheels;Cane - quad;Cane - single point;Crutches;Shower seat;Wheelchair - manual Prior Function Level of Independence: Independent with assistive device(s) Comments: per pt she was having leg weakness (as  exhibited by leg shaking, but not giving away PTA).  She also reports that she was having difficulty getting her pants on and doing her normal household chores.  Communication Communication: No difficulties    Cognition  Cognition Arousal/Alertness: Awake/alert Behavior During Therapy: WFL for tasks assessed/performed Overall Cognitive Status: Within Functional Limits for tasks assessed    Extremity/Trunk Assessment Upper Extremity Assessment Upper Extremity Assessment: Generalized weakness (decreased endurance for RW use) Lower Extremity Assessment Lower Extremity Assessment: Generalized weakness (decreased tolerance of gait, painful left leg > right) Cervical / Trunk Assessment Cervical / Trunk Assessment: Normal   Balance Balance Balance Assessed: Yes Static Sitting Balance Static Sitting - Balance Support: Feet supported Static Sitting - Level of Assistance: 7: Independent Static Standing Balance Static Standing - Balance Support: Bilateral upper extremity supported Static Standing - Level of Assistance: 5: Stand by assistance Dynamic Standing Balance Dynamic Standing - Balance Support: No upper extremity supported Dynamic Standing - Level of Assistance: 5: Stand by assistance Dynamic Standing - Comments: during toileting task  End of Session PT - End of Session Activity Tolerance: Patient limited by fatigue;Patient limited by pain Patient left: in chair;with call bell/phone within reach;with family/visitor present   Lurena Joiner B. Arda Keadle, PT, DPT [phone]   10/01/2012, 2:04 PM

## 2012-10-01 NOTE — Progress Notes (Signed)
Utilization Review Completed.Byrl Latin T9/03/2012  

## 2012-10-01 NOTE — Progress Notes (Signed)
Pt. Refuses to wear CPAP tonight. Pt. Stated that she would wear CPAP tonight on 10/01/12.

## 2012-10-01 NOTE — Progress Notes (Signed)
Pt reports PCP office called with results for Urine Culture performed at office.  Office called for results. Results faxed to unit and MD aware of results. No new orders received. Awaiting inpatient urine culture findings. Will continue to monitor pt closely.

## 2012-10-01 NOTE — Progress Notes (Signed)
[facility] PROGRESS NOTE  Courtney Floyd:[id number] DOB: December 13, 1950 DOA: 09/30/2012 PCP: Estanislado Pandy, MD  HPI/Subjective: Husband at bedside, denies any shortness of breath or chest pain.  Assessment/Plan: Principal Problem:   Bilateral swelling of feet Active Problems:   SOB (shortness of breath)   Edema   RLS (restless legs syndrome)   HTN (hypertension)   Hypertension   Diabetes   OSA (obstructive sleep apnea)   Hyperlipidemia   Bilateral lower extremity swelling  -Normal LVEF on 2-D echo done on 06/19/2012, her BNP is normal at 80. -Could have mild diastolic dysfunction, last echo did not mention any right-sided CHF. -Started on IV Lasix, fluid status is improving.   Shortness of breath with possible orthopnea  Again low suspicion of heart failure causing shortness of breath.  Strongly suspect OSA/hypoventilation secondary to obesity causing her SOB at nighttime.   Obstructive sleep apnea  Strongly suspect patient's bilateral lower extremity swelling and shortness of breath to be caused by patient's obstructive sleep apnea. Encourage compliance with her CPAP, recommended she followup with her primary care physician for another sleep study if warranted. Start CPAP at bedtime.   Hypertension  Stable. Continue home antihypertensive medications.   Restless leg syndrome  Continue Requip. Again may have been worsened due to obstructive sleep apnea symptoms from not using her CPAP. Ferritin is OK. 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   Hyperlipidemia  Stable.   Diabetes  Sliding scale insulin. Diabetic diet.   Hypothyroidism  Continue levothyroxin, check TSH.Marland Kitchen   History of breast cancer  Continue tamoxifen.   Hypokalemia  Replace as needed with oral supplements.   Code Status: Full code Family Communication: Plan discussed with the patient. Disposition Plan: Remains  inpatient   Consultants:  None  Procedures:  None  Antibiotics:  None   Objective: Filed Vitals:   10/01/12 1440  BP: 151/53  Pulse:   Temp: 98.4 F (36.9 C)  Resp: 18    Intake/Output Summary (Last 24 hours) at 10/01/12 1614 Last data filed at 10/01/12 1253  Gross per 24 hour  Intake    480 ml  Output   1650 ml  Net  -1170 ml   Filed Weights   10/01/12 0236  Weight: 119.7 kg (263 lb 14.3 oz)    Exam: General: Alert and awake, oriented x3, not in any acute distress. HEENT: anicteric sclera, pupils reactive to light and accommodation, EOMI CVS: S1-S2 clear, no murmur rubs or gallops Chest: clear to auscultation bilaterally, no wheezing, rales or rhonchi Abdomen: soft nontender, nondistended, normal bowel sounds, no organomegaly Extremities: no cyanosis, clubbing or edema noted bilaterally Neuro: Cranial nerves II-XII intact, no focal neurological deficits  Data Reviewed: Basic Metabolic Panel:  Recent Labs Lab 09/30/12 1934 10/01/12 0405  NA 144 141  K 3.4* 3.2*  CL 103 101  CO2 31 30  GLUCOSE 118* 143*  BUN 13 12  CREATININE 0.66 0.73  CALCIUM 9.5 9.3  MG  --  2.0   Liver Function Tests:  Recent Labs Lab 09/30/12 1934  AST 51*  ALT 30  ALKPHOS 75  BILITOT 0.3  PROT 6.7  ALBUMIN 3.5   No results found for this basename: LIPASE, AMYLASE,  in the last 168 hours No results found for this basename: AMMONIA,  in the last 168 hours CBC:  Recent Labs Lab 09/30/12 1934 10/01/12 0405  WBC 11.4* 10.7*  NEUTROABS 6.7  --   HGB 10.6* 10.4*  HCT 32.8* 32.5*  MCV  89.6 89.5  PLT 218 222   Cardiac Enzymes:  Recent Labs Lab 10/01/12 0140 10/01/12 0745  TROPONINI <0.30 <0.30   BNP (last 3 results)  Recent Labs  09/30/12 2335  PROBNP 80.9   CBG:  Recent Labs Lab 10/01/12 0613 10/01/12 1056 10/01/12 1609  GLUCAP 128* 130* 124*    Micro No results found for this or any previous visit (from the past 240 hour(s)).    Studies: Dg Chest 2 View  09/30/2012   *RADIOLOGY REPORT*  Clinical Data: Short of breath  CHEST - 2 VIEW  Comparison: 06/05/2012  Findings: Cardiac enlargement.  Negative for heart failure.  Lungs are clear without infiltrate effusion or mass.  IMPRESSION: No active cardiopulmonary abnormality.   Original Report Authenticated By: Janeece Riggers, M.D.    Scheduled Meds: . citalopram  40 mg Oral q morning - 10a  . enoxaparin (LOVENOX) injection  40 mg Subcutaneous Q24H  . furosemide  60 mg Intravenous BID  . gabapentin  300 mg Oral QHS  . insulin aspart  0-5 Units Subcutaneous QHS  . insulin aspart  0-9 Units Subcutaneous TID WC  . levothyroxine  75 mcg Oral QAC breakfast  . losartan  50 mg Oral Daily  . rOPINIRole  2 mg Oral Q1200  . rOPINIRole  4 mg Oral BID  . tamoxifen  20 mg Oral QHS   Continuous Infusions:      Time spent: 35 minutes    [facility] A  [facility] Pager [phone] If 7PM-7AM, please contact night-coverage at [email or web address], password [facility] 10/01/2012, 4:14 PM  LOS: 1 day

## 2012-10-01 NOTE — Progress Notes (Signed)
*  PRELIMINARY RESULTS* Vascular Ultrasound Lower extremity venous duplex has been completed.  Preliminary findings: negative for DVT.    Farrel Demark, RDMS, RVT  10/01/2012, 11:48 AM

## 2012-10-02 LAB — URINE CULTURE: Colony Count: >=100000

## 2012-10-02 LAB — GLUCOSE, CAPILLARY: Glucose-Capillary: 133 mg/dL — ABNORMAL HIGH (ref 70–99)

## 2012-10-02 LAB — BASIC METABOLIC PANEL
BUN: 14 mg/dL (ref 6–23)
CO2: 29 mEq/L (ref 19–32)
Chloride: 102 mEq/L (ref 96–112)
Creatinine, Ser: 0.72 mg/dL (ref 0.50–1.10)
GFR calc Af Amer: >90 mL/min (ref >90)
Potassium: 3.9 mEq/L (ref 3.5–5.1)

## 2012-10-02 LAB — TSH: TSH: 3.091 u[IU]/mL (ref 0.350–4.500)

## 2012-10-02 MED ORDER — CYCLOBENZAPRINE HCL 5 MG PO TABS
5.0000 mg | ORAL_TABLET | Freq: Once | ORAL | Status: AC
  Administered 2012-10-02: 5 mg via ORAL
  Filled 2012-10-02: qty 1

## 2012-10-02 MED ORDER — CIPROFLOXACIN HCL 500 MG PO TABS
500.0000 mg | ORAL_TABLET | Freq: Two times a day (BID) | ORAL | Status: DC
  Administered 2012-10-02 – 2012-10-05 (×6): 500 mg via ORAL
  Filled 2012-10-02 (×8): qty 1

## 2012-10-02 MED ORDER — FUROSEMIDE 10 MG/ML IJ SOLN
60.0000 mg | Freq: Three times a day (TID) | INTRAMUSCULAR | Status: DC
  Administered 2012-10-02 – 2012-10-04 (×5): 60 mg via INTRAVENOUS
  Filled 2012-10-02 (×7): qty 6

## 2012-10-02 NOTE — Progress Notes (Addendum)
[facility] PROGRESS NOTE  Courtney Floyd:[id number] DOB: Jan 30, 1950 DOA: 09/30/2012 PCP: Estanislado Pandy, MD  HPI/Subjective: Complaining about being sleepless last nigh because of she did not wear her CPAP and her restless leg syndrome.  Assessment/Plan: Principal Problem:   Bilateral swelling of feet Active Problems:   SOB (shortness of breath)   Edema   RLS (restless legs syndrome)   HTN (hypertension)   Hypertension   Diabetes   OSA (obstructive sleep apnea)   Hyperlipidemia   Bilateral lower extremity swelling  -Normal LVEF on 2-D echo done on 06/19/2012, her BNP is normal at 80. -Could have mild diastolic dysfunction, last echo did not mention any right-sided CHF. -Started on IV Lasix, continue diuresis.  UTI -Urinalysis grew coagulase-negative Staphylococcus, susceptibility from outpatient facility below. -Patient started on oral ciprofloxacin.  Shortness of breath with possible orthopnea  Again low suspicion of heart failure causing shortness of breath.  Strongly suspect OSA/hypoventilation secondary to obesity causing her SOB at nighttime.   Obstructive sleep apnea  Strongly suspect patient's bilateral lower extremity swelling and shortness of breath to be caused by patient's obstructive sleep apnea. Encourage compliance with her CPAP, recommended she followup with her primary care physician for another sleep study if warranted. Start CPAP at bedtime.   Hypertension  Stable. Continue home antihypertensive medications.   Restless leg syndrome  Continue Requip. Again may have been worsened due to obstructive sleep apnea symptoms from not using her CPAP. Ferritin is OK. 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   Hyperlipidemia  Stable.   Diabetes  Sliding scale insulin. Diabetic diet.   Hypothyroidism  Continue levothyroxin, check TSH.Marland Kitchen   History of breast cancer  Continue tamoxifen.   Hypokalemia  Replace as needed with  oral supplements.   Code Status: Full code Family Communication: Plan discussed with the patient. Disposition Plan: Remains inpatient   Consultants:  None  Procedures:  None  Antibiotics:  None   Objective: Filed Vitals:   10/02/12 1341  BP: 119/34  Pulse: 65  Temp: 98.8 F (37.1 C)  Resp: 19    Intake/Output Summary (Last 24 hours) at 10/02/12 1540 Last data filed at 10/02/12 1300  Gross per 24 hour  Intake    600 ml  Output   1250 ml  Net   -650 ml   Filed Weights   10/01/12 0236 10/02/12 0611  Weight: 119.7 kg (263 lb 14.3 oz) 116.8 kg (257 lb 8 oz)    Exam: General: Alert and awake, oriented x3, not in any acute distress. HEENT: anicteric sclera, pupils reactive to light and accommodation, EOMI CVS: S1-S2 clear, no murmur rubs or gallops Chest: clear to auscultation bilaterally, no wheezing, rales or rhonchi Abdomen: soft nontender, nondistended, normal bowel sounds, no organomegaly Extremities: no cyanosis, clubbing or edema noted bilaterally Neuro: Cranial nerves II-XII intact, no focal neurological deficits  Data Reviewed: Basic Metabolic Panel:  Recent Labs Lab 09/30/12 1934 10/01/12 0405 10/02/12 0510  NA 144 141 140  K 3.4* 3.2* 3.9  CL 103 101 102  CO2 31 30 29   GLUCOSE 118* 143* 122*  BUN 13 12 14   CREATININE 0.66 0.73 0.72  CALCIUM 9.5 9.3 8.9  MG  --  2.0  --    Liver Function Tests:  Recent Labs Lab 09/30/12 1934  AST 51*  ALT 30  ALKPHOS 75  BILITOT 0.3  PROT 6.7  ALBUMIN 3.5   No results found for this basename: LIPASE, AMYLASE,  in the last 168  hours No results found for this basename: AMMONIA,  in the last 168 hours CBC:  Recent Labs Lab 09/30/12 1934 10/01/12 0405  WBC 11.4* 10.7*  NEUTROABS 6.7  --   HGB 10.6* 10.4*  HCT 32.8* 32.5*  MCV 89.6 89.5  PLT 218 222   Cardiac Enzymes:  Recent Labs Lab 10/01/12 0140 10/01/12 0745  TROPONINI <0.30 <0.30   BNP (last 3 results)  Recent Labs   09/30/12 2335  PROBNP 80.9   CBG:  Recent Labs Lab 10/01/12 1056 10/01/12 1609 10/01/12 2106 10/02/12 0608 10/02/12 1127  GLUCAP 130* 124* 144* 125* 147*    Micro Recent Results (from the past 240 hour(s))  URINE CULTURE     Status: None   Collection Time    09/30/12 10:16 PM      Result Value Range Status   Specimen Description URINE, CLEAN CATCH   Final   Special Requests CX ADDED AT 2237 ON [id number]   Final   Culture  Setup Time     Final   Value: 09/30/2012 22:45     Performed at [facility] Count     Final   Value: >=100,000 COLONIES/ML     Performed at [facility]   Culture     Final   Value: STAPHYLOCOCCUS SPECIES (COAGULASE NEGATIVE)     Note: RIFAMPIN AND GENTAMICIN SHOULD NOT BE USED AS SINGLE DRUGS FOR TREATMENT OF STAPH INFECTIONS.     Performed at [facility]   Report Status PENDING   Incomplete     Studies: Dg Chest 2 View  09/30/2012   *RADIOLOGY REPORT*  Clinical Data: Short of breath  CHEST - 2 VIEW  Comparison: 06/05/2012  Findings: Cardiac enlargement.  Negative for heart failure.  Lungs are clear without infiltrate effusion or mass.  IMPRESSION: No active cardiopulmonary abnormality.   Original Report Authenticated By: Janeece Riggers, M.D.    Scheduled Meds: . citalopram  40 mg Oral q morning - 10a  . enoxaparin (LOVENOX) injection  40 mg Subcutaneous Q24H  . furosemide  60 mg Intravenous BID  . gabapentin  300 mg Oral QHS  . insulin aspart  0-5 Units Subcutaneous QHS  . insulin aspart  0-9 Units Subcutaneous TID WC  . levothyroxine  75 mcg Oral QAC breakfast  . losartan  50 mg Oral Daily  . rOPINIRole  2 mg Oral Q1200  . rOPINIRole  4 mg Oral BID  . tamoxifen  20 mg Oral QHS   Continuous Infusions:      Time spent: 35 minutes    [facility] A  [facility] Pager [phone] If 7PM-7AM, please contact night-coverage at [email or web address], password [facility] 10/02/2012, 3:40 PM  LOS: 2 days

## 2012-10-03 DIAGNOSIS — G2581 Restless legs syndrome: Secondary | ICD-10-CM

## 2012-10-03 LAB — GLUCOSE, CAPILLARY: Glucose-Capillary: 105 mg/dL — ABNORMAL HIGH (ref 70–99)

## 2012-10-03 LAB — BASIC METABOLIC PANEL
BUN: 17 mg/dL (ref 6–23)
Calcium: 9.2 mg/dL (ref 8.4–10.5)
Chloride: 100 mEq/L (ref 96–112)
Creatinine, Ser: 0.76 mg/dL (ref 0.50–1.10)
GFR calc Af Amer: >90 mL/min (ref >90)
GFR calc non Af Amer: 88 mL/min — ABNORMAL LOW (ref >90)

## 2012-10-03 LAB — MAGNESIUM: Magnesium: 2 mg/dL (ref 1.5–2.5)

## 2012-10-03 LAB — CK TOTAL AND CKMB (NOT AT ARMC): Total CK: 142 U/L (ref 7–177)

## 2012-10-03 MED ORDER — POTASSIUM CHLORIDE CRYS ER 20 MEQ PO TBCR
60.0000 meq | EXTENDED_RELEASE_TABLET | Freq: Once | ORAL | Status: AC
  Administered 2012-10-03: 60 meq via ORAL
  Filled 2012-10-03: qty 3

## 2012-10-03 MED ORDER — FERROUS SULFATE 325 (65 FE) MG PO TABS
325.0000 mg | ORAL_TABLET | Freq: Two times a day (BID) | ORAL | Status: DC
Start: 2012-10-03 — End: 2012-10-05
  Administered 2012-10-03 – 2012-10-05 (×4): 325 mg via ORAL
  Filled 2012-10-03 (×6): qty 1

## 2012-10-03 MED ORDER — TRAZODONE HCL 50 MG PO TABS
50.0000 mg | ORAL_TABLET | Freq: Every day | ORAL | Status: DC
  Administered 2012-10-03: 50 mg via ORAL
  Filled 2012-10-03 (×2): qty 1

## 2012-10-03 MED ORDER — HYDROCODONE-ACETAMINOPHEN 5-325 MG PO TABS
1.0000 | ORAL_TABLET | Freq: Four times a day (QID) | ORAL | Status: DC | PRN
  Administered 2012-10-03: 1 via ORAL
  Administered 2012-10-03 – 2012-10-05 (×3): 2 via ORAL
  Filled 2012-10-03 (×3): qty 2
  Filled 2012-10-03: qty 1

## 2012-10-03 MED ORDER — VITAMIN C 250 MG PO TABS
250.0000 mg | ORAL_TABLET | Freq: Two times a day (BID) | ORAL | Status: DC
  Administered 2012-10-03 – 2012-10-05 (×4): 250 mg via ORAL
  Filled 2012-10-03 (×6): qty 1

## 2012-10-03 NOTE — ED Provider Notes (Signed)
Medical screening examination/treatment/procedure(s) were performed by non-physician practitioner and as supervising physician I was immediately available for consultation/collaboration.  Yahir Tavano T Ophelia Sipe, MD 10/03/12 0758 

## 2012-10-03 NOTE — Progress Notes (Signed)
[facility] PROGRESS NOTE  Courtney Floyd [id number] DOB: [birth year] DOA: 09/30/2012 PCP: Estanislado Pandy, MD  HPI/Subjective:  continues to have insomnia, we'll start her on trazodone.  Assessment/Plan: Principal Problem:   Bilateral swelling of feet Active Problems:   SOB (shortness of breath)   Edema   RLS (restless legs syndrome)   HTN (hypertension)   Hypertension   Diabetes   OSA (obstructive sleep apnea)   Hyperlipidemia   Bilateral lower extremity swelling  -Normal LVEF on 2-D echo done on 06/19/2012, her BNP is normal at 80. -Could have mild diastolic dysfunction, last echo did not mention any right-sided CHF. -Started on IV Lasix, continue diuresis.  UTI -Urinalysis grew coagulase-negative Staphylococcus, susceptibility from outpatient facility below. -Patient started on oral ciprofloxacin.  Shortness of breath with possible orthopnea  -Low suspicion of heart failure causing SOB. -2-D echo on 06/19/2012 showed LVEF of 63%, no mention of diastolic dysfunction -Strongly suspect OSA/hypoventilation secondary to obesity causing her SOB at nighttime.   Obstructive sleep apnea  -Strongly suspect patient's bilateral lower extremity swelling and shortness of breath to be caused by patient's OSA. -Encourage compliance with her CPAP, recommended she followup with her primary care physician for another sleep study if warranted. Start CPAP at bedtime.   Hypertension  Stable. Continue home antihypertensive medications.   Restless leg syndrome  Continue Requip. Again may have been worsened due to obstructive sleep apnea symptoms from not using her CPAP. Ferritin is OK. 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   Hyperlipidemia  Stable.   Diabetes  Sliding scale insulin. Diabetic diet.   Hypothyroidism  Continue levothyroxin, check TSH.Marland Kitchen   History of breast cancer  Continue tamoxifen.   Hypokalemia  Replace as needed with oral  supplements.   Code Status: Full code Family Communication: Plan discussed with the patient. Disposition Plan: Remains inpatient   Consultants:  None  Procedures:  None  Antibiotics:  None   Objective: Filed Vitals:   10/03/12 0412  BP: 132/45  Pulse: 60  Temp: 97.7 F (36.5 C)  Resp: 18    Intake/Output Summary (Last 24 hours) at 10/03/12 0931 Last data filed at 10/03/12 0805  Gross per 24 hour  Intake    600 ml  Output   3000 ml  Net  -2400 ml   Filed Weights   10/01/12 0236 10/02/12 0611 10/03/12 0414  Weight: 119.7 kg (263 lb 14.3 oz) 116.8 kg (257 lb 8 oz) 116.6 kg (257 lb 0.9 oz)    Exam: General: Alert and awake, oriented x3, not in any acute distress. HEENT: anicteric sclera, pupils reactive to light and accommodation, EOMI CVS: S1-S2 clear, no murmur rubs or gallops Chest: clear to auscultation bilaterally, no wheezing, rales or rhonchi Abdomen: soft nontender, nondistended, normal bowel sounds, no organomegaly Extremities: no cyanosis, clubbing or edema noted bilaterally Neuro: Cranial nerves II-XII intact, no focal neurological deficits  Data Reviewed: Basic Metabolic Panel:  Recent Labs Lab 09/30/12 1934 10/01/12 0405 10/02/12 0510 10/03/12 0605  NA 144 141 140 139  K 3.4* 3.2* 3.9 3.6  CL 103 101 102 100  CO2 31 30 29 28   GLUCOSE 118* 143* 122* 112*  BUN 13 12 14 17   CREATININE 0.66 0.73 0.72 0.76  CALCIUM 9.5 9.3 8.9 9.2  MG  --  2.0  --  2.0   Liver Function Tests:  Recent Labs Lab 09/30/12 1934  AST 51*  ALT 30  ALKPHOS 75  BILITOT 0.3  PROT 6.7  ALBUMIN  3.5   No results found for this basename: LIPASE, AMYLASE,  in the last 168 hours No results found for this basename: AMMONIA,  in the last 168 hours CBC:  Recent Labs Lab 09/30/12 1934 10/01/12 0405  WBC 11.4* 10.7*  NEUTROABS 6.7  --   HGB 10.6* 10.4*  HCT 32.8* 32.5*  MCV 89.6 89.5  PLT 218 222   Cardiac Enzymes:  Recent Labs Lab 10/01/12 0140  10/01/12 0745  TROPONINI <0.30 <0.30   BNP (last 3 results)  Recent Labs  09/30/12 2335  PROBNP 80.9   CBG:  Recent Labs Lab 10/02/12 0608 10/02/12 1127 10/02/12 1606 10/02/12 2004 10/03/12 0539  GLUCAP 125* 147* 133* 140* 105*    Micro Recent Results (from the past 240 hour(s))  URINE CULTURE     Status: None   Collection Time    09/30/12 10:16 PM      Result Value Range Status   Specimen Description URINE, CLEAN CATCH   Final   Special Requests CX ADDED AT 2237 ON [id number]   Final   Culture  Setup Time     Final   Value: 09/30/2012 22:45     Performed at [facility] Count     Final   Value: >=100,000 COLONIES/ML     Performed at [facility]   Culture     Final   Value: STAPHYLOCOCCUS SPECIES (COAGULASE NEGATIVE)     Note: RIFAMPIN AND GENTAMICIN SHOULD NOT BE USED AS SINGLE DRUGS FOR TREATMENT OF STAPH INFECTIONS.     Performed at [facility]   Report Status 10/02/2012 FINAL   Final   Organism ID, Bacteria STAPHYLOCOCCUS SPECIES (COAGULASE NEGATIVE)   Final  CULTURE, BLOOD (ROUTINE X 2)     Status: None   Collection Time    10/02/12  9:05 AM      Result Value Range Status   Specimen Description BLOOD RIGHT ARM   Final   Special Requests BOTTLES DRAWN AEROBIC ONLY 10CC   Final   Culture  Setup Time     Final   Value: 10/02/2012 13:19     Performed at [facility]   Culture     Final   Value:        BLOOD CULTURE RECEIVED NO GROWTH TO DATE CULTURE WILL BE HELD FOR 5 DAYS BEFORE ISSUING A FINAL NEGATIVE REPORT     Performed at [facility]   Report Status PENDING   Incomplete  CULTURE, BLOOD (ROUTINE X 2)     Status: None   Collection Time    10/02/12  9:10 AM      Result Value Range Status   Specimen Description BLOOD LEFT ARM   Final   Special Requests BOTTLES DRAWN AEROBIC ONLY 10CC   Final   Culture  Setup Time     Final   Value: 10/02/2012 13:19     Performed at [facility]   Culture      Final   Value:        BLOOD CULTURE RECEIVED NO GROWTH TO DATE CULTURE WILL BE HELD FOR 5 DAYS BEFORE ISSUING A FINAL NEGATIVE REPORT     Performed at [facility]   Report Status PENDING   Incomplete     Studies: No results found.  Scheduled Meds: . ciprofloxacin  500 mg Oral BID  . citalopram  40 mg Oral q morning - 10a  . enoxaparin (LOVENOX) injection  40 mg Subcutaneous Q24H  . furosemide  60 mg Intravenous Q8H  . gabapentin  300 mg Oral QHS  . insulin aspart  0-5 Units Subcutaneous QHS  . insulin aspart  0-9 Units Subcutaneous TID WC  . levothyroxine  75 mcg Oral QAC breakfast  . losartan  50 mg Oral Daily  . rOPINIRole  2 mg Oral Q1200  . rOPINIRole  4 mg Oral BID  . tamoxifen  20 mg Oral QHS   Continuous Infusions:      Time spent: 35 minutes    [facility] A  [facility] Pager [phone] If 7PM-7AM, please contact night-coverage at [email or web address], password [facility] 10/03/2012, 9:31 AM  LOS: 3 days

## 2012-10-03 NOTE — Progress Notes (Signed)
Physical Therapy Treatment Patient Details Name: Courtney Floyd MRN: [id number] DOB: 03/26/1950 Today's Date: 10/03/2012 Time: 6295-2841 PT Time Calculation (min): 36 min  PT Assessment / Plan / Recommendation  History of Present Illness 62 y.o. female admitted to [facility] on 09/30/12 with history of hypertension, diabetes, breast cancer on tamoxifen, obstructive sleep apnea recent intolerance to CPAP due to bronchitis, restless leg syndrome, and hyperlipidemia who presents with SOB and LE edema.  She has had a 2-D echocardiogram on 06/19/2012 which showed EF of 60-65%.  Since then she has had bouts of bronchitis for which she has required antibiotics and steroids.  In the same period of time she is noted increasing lower extremity edema and shortness of breath especially laying down flat requiring 2-3 pillows.  Her primary care physician has started her on Lasix and has increased the dose.  As she has not been getting any better at home she presented to the emergency department for further evaluation.   PT Comments   Pt able to progress with gait without knees buckling. Pt encouraged to continue HEP  Follow Up Recommendations        Does the patient have the potential to tolerate intense rehabilitation     Barriers to Discharge        Equipment Recommendations       Recommendations for Other Services    Frequency     Progress towards PT Goals Progress towards PT goals: Progressing toward goals  Plan Current plan remains appropriate    Precautions / Restrictions Precautions Precautions: None Restrictions LLE Weight Bearing: Touchdown weight bearing Other Position/Activity Restrictions: Per pt physician told her to use a RW and "limit" her WB on her left leg.  "Just enough to balance myself"  Functionally pt looks to be PWB not TDWB that she reported she was supposed to be doing.     Pertinent Vitals/Pain Pt reports soreness bil LE 5/10     Mobility  Bed Mobility Supine to Sit: 6:  Modified independent (Device/Increase time);HOB flat Transfers Sit to Stand: 6: Modified independent (Device/Increase time);From bed Stand to Sit: 6: Modified independent (Device/Increase time);To chair/3-in-1 Ambulation/Gait Ambulation/Gait Assistance: 5: Supervision Ambulation Distance (Feet): 250 Feet Assistive device: Rolling walker Ambulation/Gait Assistance Details: cueing for TDWB as pt continues with PWB.  Gait Pattern: Step-to pattern Gait velocity: decreased Stairs: Yes Stairs Assistance: 5: Supervision Stair Management Technique: With walker;Backwards Number of Stairs: 1    Exercises General Exercises - Lower Extremity Long Arc Quad: AROM;Both;Seated;20 reps Hip Flexion/Marching: AROM;Both;Seated;20 reps   PT Diagnosis:    PT Problem List:   PT Treatment Interventions:     PT Goals (current goals can now be found in the care plan section)    Visit Information  Last PT Received On: 10/03/12 Assistance Needed: +1 History of Present Illness: 62 y.o. female admitted to [facility] on 09/30/12 with history of hypertension, diabetes, breast cancer on tamoxifen, obstructive sleep apnea recent intolerance to CPAP due to bronchitis, restless leg syndrome, and hyperlipidemia who presents with SOB and LE edema.  She has had a 2-D echocardiogram on 06/19/2012 which showed EF of 60-65%.  Since then she has had bouts of bronchitis for which she has required antibiotics and steroids.  In the same period of time she is noted increasing lower extremity edema and shortness of breath especially laying down flat requiring 2-3 pillows.  Her primary care physician has started her on Lasix and has increased the dose.  As she has not been getting  any better at home she presented to the emergency department for further evaluation.    Subjective Data      Cognition  Cognition Arousal/Alertness: Awake/alert Behavior During Therapy: WFL for tasks assessed/performed Overall Cognitive Status: Within  Functional Limits for tasks assessed    Balance     End of Session PT - End of Session Equipment Utilized During Treatment: Gait belt Activity Tolerance: Patient tolerated treatment well Patient left: in chair;with call bell/phone within reach Nurse Communication: Mobility status   GP     Delorse Lek 10/03/2012, 12:31 PM Delaney Meigs, PT [phone]

## 2012-10-04 LAB — GLUCOSE, CAPILLARY
Glucose-Capillary: 142 mg/dL — ABNORMAL HIGH (ref 70–99)
Glucose-Capillary: 164 mg/dL — ABNORMAL HIGH (ref 70–99)

## 2012-10-04 LAB — BASIC METABOLIC PANEL
BUN: 28 mg/dL — ABNORMAL HIGH (ref 6–23)
Calcium: 9.3 mg/dL (ref 8.4–10.5)
Creatinine, Ser: 1.24 mg/dL — ABNORMAL HIGH (ref 0.50–1.10)
GFR calc Af Amer: 52 mL/min — ABNORMAL LOW (ref >90)
GFR calc non Af Amer: 45 mL/min — ABNORMAL LOW (ref >90)
Potassium: 4.1 mEq/L (ref 3.5–5.1)

## 2012-10-04 LAB — VITAMIN D 25 HYDROXY (VIT D DEFICIENCY, FRACTURES): Vit D, 25-Hydroxy: 36 ng/mL (ref 30–89)

## 2012-10-04 MED ORDER — FUROSEMIDE 80 MG PO TABS
80.0000 mg | ORAL_TABLET | Freq: Two times a day (BID) | ORAL | Status: DC
  Administered 2012-10-05: 80 mg via ORAL
  Filled 2012-10-04 (×3): qty 1

## 2012-10-04 MED ORDER — TRAZODONE 25 MG HALF TABLET
25.0000 mg | ORAL_TABLET | Freq: Every day | ORAL | Status: DC
  Administered 2012-10-04: 25 mg via ORAL
  Filled 2012-10-04 (×2): qty 1

## 2012-10-04 NOTE — Progress Notes (Signed)
[facility] PROGRESS NOTE  Courtney Floyd JXB:[id number] DOB: April 08, 1950 DOA: 09/30/2012 PCP: Estanislado Pandy, MD  HPI/Subjective: Feels much better, legs edema decreased  Assessment/Plan: Principal Problem:   Bilateral swelling of feet Active Problems:   SOB (shortness of breath)   Edema   RLS (restless legs syndrome)   HTN (hypertension)   Hypertension   Diabetes   OSA (obstructive sleep apnea)   Hyperlipidemia   Bilateral lower extremity swelling  -Normal LVEF on 2-D echo done on 06/19/2012, her BNP is normal at 80. -Could have mild diastolic dysfunction, last echo did not mention any right-sided CHF. -Stop IV Lasix, as her renal function decreased. -Switch to PO Lasix, I will place her in her home dose of lasix.  Acute Kidney injury  -Secondary to over-diuresis. -Stop IV lasix. Check BMP in AM.  UTI -Urinalysis grew coagulase-negative Staphylococcus, susceptibility from outpatient facility below. -Patient started on oral ciprofloxacin.  Shortness of breath with possible orthopnea  -Low suspicion of heart failure causing SOB. -2-D echo on 06/19/2012 showed LVEF of 63%, no mention of diastolic dysfunction -Strongly suspect OSA/hypoventilation secondary to obesity causing her SOB at nighttime.   Obstructive sleep apnea  -Strongly suspect patient's bilateral lower extremity swelling and shortness of breath to be caused by patient's OSA. -Encourage compliance with her CPAP, recommended she followup with her primary care physician for another sleep study if warranted. Start CPAP at bedtime.   Hypertension  Stable. Continue home antihypertensive medications.   Restless leg syndrome  Continue Requip. Again may have been worsened due to obstructive sleep apnea symptoms from not using her CPAP. Ferritin is OK. 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   Hyperlipidemia  Stable.   Diabetes  Sliding scale insulin. Diabetic diet.   Hypothyroidism    Continue levothyroxin, check TSH.Marland Kitchen   History of breast cancer  Continue tamoxifen.   Hypokalemia  Replace as needed with oral supplements.   Code Status: Full code Family Communication: Plan discussed with the patient. Disposition Plan: Remains inpatient   Consultants:  None  Procedures:  None  Antibiotics:  None   Objective: Filed Vitals:   10/04/12 0355  BP: 92/33  Pulse: 62  Temp: 98.2 F (36.8 C)  Resp: 19    Intake/Output Summary (Last 24 hours) at 10/04/12 1152 Last data filed at 10/03/12 2335  Gross per 24 hour  Intake    840 ml  Output   2550 ml  Net  -1710 ml   Filed Weights   10/02/12 0611 10/03/12 0414 10/04/12 0500  Weight: 116.8 kg (257 lb 8 oz) 116.6 kg (257 lb 0.9 oz) 115.9 kg (255 lb 8.2 oz)    Exam: General: Alert and awake, oriented x3, not in any acute distress. HEENT: anicteric sclera, pupils reactive to light and accommodation, EOMI CVS: S1-S2 clear, no murmur rubs or gallops Chest: clear to auscultation bilaterally, no wheezing, rales or rhonchi Abdomen: soft nontender, nondistended, normal bowel sounds, no organomegaly Extremities: no cyanosis, clubbing or edema noted bilaterally Neuro: Cranial nerves II-XII intact, no focal neurological deficits  Data Reviewed: Basic Metabolic Panel:  Recent Labs Lab 09/30/12 1934 10/01/12 0405 10/02/12 0510 10/03/12 0605 10/04/12 0520  NA 144 141 140 139 140  K 3.4* 3.2* 3.9 3.6 4.1  CL 103 101 102 100 100  CO2 31 30 29 28 28   GLUCOSE 118* 143* 122* 112* 115*  BUN 13 12 14 17  28*  CREATININE 0.66 0.73 0.72 0.76 1.24*  CALCIUM 9.5 9.3 8.9 9.2 9.3  MG  --  2.0  --  2.0  --    Liver Function Tests:  Recent Labs Lab 09/30/12 1934  AST 51*  ALT 30  ALKPHOS 75  BILITOT 0.3  PROT 6.7  ALBUMIN 3.5   No results found for this basename: LIPASE, AMYLASE,  in the last 168 hours No results found for this basename: AMMONIA,  in the last 168 hours CBC:  Recent Labs Lab  09/30/12 1934 10/01/12 0405  WBC 11.4* 10.7*  NEUTROABS 6.7  --   HGB 10.6* 10.4*  HCT 32.8* 32.5*  MCV 89.6 89.5  PLT 218 222   Cardiac Enzymes:  Recent Labs Lab 10/01/12 0140 10/01/12 0745 10/03/12 1035  CKTOTAL  --   --  142  CKMB  --   --  3.8  TROPONINI <0.30 <0.30  --    BNP (last 3 results)  Recent Labs  09/30/12 2335  PROBNP 80.9   CBG:  Recent Labs Lab 10/03/12 1148 10/03/12 1627 10/03/12 2108 10/04/12 0605 10/04/12 1113  GLUCAP 105* 115* 148* 106* 139*    Micro Recent Results (from the past 240 hour(s))  URINE CULTURE     Status: None   Collection Time    09/30/12 10:16 PM      Result Value Range Status   Specimen Description URINE, CLEAN CATCH   Final   Special Requests CX ADDED AT 2237 ON [id number]   Final   Culture  Setup Time     Final   Value: 09/30/2012 22:45     Performed at [facility] Count     Final   Value: >=100,000 COLONIES/ML     Performed at [facility]   Culture     Final   Value: STAPHYLOCOCCUS SPECIES (COAGULASE NEGATIVE)     Note: RIFAMPIN AND GENTAMICIN SHOULD NOT BE USED AS SINGLE DRUGS FOR TREATMENT OF STAPH INFECTIONS.     Performed at [facility]   Report Status 10/02/2012 FINAL   Final   Organism ID, Bacteria STAPHYLOCOCCUS SPECIES (COAGULASE NEGATIVE)   Final  CULTURE, BLOOD (ROUTINE X 2)     Status: None   Collection Time    10/02/12  9:05 AM      Result Value Range Status   Specimen Description BLOOD RIGHT ARM   Final   Special Requests BOTTLES DRAWN AEROBIC ONLY 10CC   Final   Culture  Setup Time     Final   Value: 10/02/2012 13:19     Performed at [facility]   Culture     Final   Value:        BLOOD CULTURE RECEIVED NO GROWTH TO DATE CULTURE WILL BE HELD FOR 5 DAYS BEFORE ISSUING A FINAL NEGATIVE REPORT     Performed at [facility]   Report Status PENDING   Incomplete  CULTURE, BLOOD (ROUTINE X 2)     Status: None   Collection Time    10/02/12  9:10 AM       Result Value Range Status   Specimen Description BLOOD LEFT ARM   Final   Special Requests BOTTLES DRAWN AEROBIC ONLY 10CC   Final   Culture  Setup Time     Final   Value: 10/02/2012 13:19     Performed at [facility]   Culture     Final   Value:        BLOOD CULTURE RECEIVED NO GROWTH TO DATE CULTURE WILL  BE HELD FOR 5 DAYS BEFORE ISSUING A FINAL NEGATIVE REPORT     Performed at [facility]   Report Status PENDING   Incomplete     Studies: No results found.  Scheduled Meds: . ciprofloxacin  500 mg Oral BID  . citalopram  40 mg Oral q morning - 10a  . enoxaparin (LOVENOX) injection  40 mg Subcutaneous Q24H  . ferrous sulfate  325 mg Oral BID WC   And  . vitamin C  250 mg Oral BID WC  . gabapentin  300 mg Oral QHS  . insulin aspart  0-5 Units Subcutaneous QHS  . insulin aspart  0-9 Units Subcutaneous TID WC  . levothyroxine  75 mcg Oral QAC breakfast  . losartan  50 mg Oral Daily  . rOPINIRole  2 mg Oral Q1200  . rOPINIRole  4 mg Oral BID  . tamoxifen  20 mg Oral QHS  . traZODone  50 mg Oral QHS   Continuous Infusions:      Time spent: 35 minutes    [facility] A  [facility] Pager [phone] If 7PM-7AM, please contact night-coverage at [email or web address], password [facility] 10/04/2012, 11:52 AM  LOS: 4 days

## 2012-10-05 LAB — BASIC METABOLIC PANEL
Calcium: 9.2 mg/dL (ref 8.4–10.5)
GFR calc Af Amer: 88 mL/min — ABNORMAL LOW (ref >90)
GFR calc non Af Amer: 76 mL/min — ABNORMAL LOW (ref >90)
Sodium: 139 mEq/L (ref 135–145)

## 2012-10-05 LAB — GLUCOSE, CAPILLARY

## 2012-10-05 MED ORDER — FERROUS SULFATE 325 (65 FE) MG PO TABS: 325.0000 mg | ORAL_TABLET | Freq: Two times a day (BID) | ORAL | Status: AC

## 2012-10-05 MED ORDER — TRAZODONE 25 MG HALF TABLET: 25.0000 mg | ORAL_TABLET | Freq: Every day | ORAL | Status: AC

## 2012-10-05 NOTE — Discharge Summary (Signed)
Physician Discharge Summary  Courtney Floyd ZOX:[id number] DOB: 27-Mar-1950 DOA: 09/30/2012  PCP: Estanislado Pandy, MD  Admit date: 09/30/2012 Discharge date: 10/05/2012  Time spent: 40 minutes  Recommendations for Outpatient Follow-up:  1. Follow up with PCP in 1 week. 2. Check BMP in 1 week, for creatinine and potassium.  Discharge Diagnoses:  Principal Problem:   Bilateral swelling of feet Active Problems:   SOB (shortness of breath)   Edema   RLS (restless legs syndrome)   HTN (hypertension)   Hypertension   Diabetes   OSA (obstructive sleep apnea)   Hyperlipidemia   Discharge Condition: Stable  Diet recommendation: CHO modified diet  Filed Weights   10/03/12 0414 10/04/12 0500 10/05/12 0453  Weight: 116.6 kg (257 lb 0.9 oz) 115.9 kg (255 lb 8.2 oz) 116.983 kg (257 lb 14.4 oz)    History of present illness:  Courtney Floyd is a 62 y.o. Caucasian female with history of hypertension, diabetes, breast cancer on tamoxifen, obstructive sleep apnea recent intolerance to CPAP due to bronchitis, restless leg syndrome, and hyperlipidemia who presents with the above complaints. Patient reports that she has had chronic edema for many years however over the last 2-3 months she has had increasing lower extremity edema. She has had extensive workup including an evaluation with cardiology, vascular surgery, and pulmonary. She has had a 2-D echocardiogram on 06/19/2012 which showed EF of 60-65%, wall motion was normal, no regional wall motion abnormalities. Since then she has had bouts of bronchitis for which she has required antibiotics and steroids. Due to her episodes of bronchitis she has not been consistently using her CPAP. In the same period of time she is noted increasing lower extremity edema and shortness of breath especially laying down flat requiring 2-3 pillows. Her primary care physician has started her on Lasix and has increased the dose. As she has not been getting any better at  home she presented to the emergency department for further evaluation. She denies any fevers or chills. Does complain of some chest tightness, currently denies any shortness of breath. Denies any abdominal pain or diarrhea. Denies any vision changes, complaining of right-sided headaches. Hospitalist service was asked to admit the patient for further care and management.  Hospital Course:   1. Bilateral lower extremity swelling: Patient does have normal LV EF on 2-D echocardiogram done on 06/19/2012, her BNP was normal at 80 and the time of admission. It could have mild diastolic dysfunction was not mentioned on the 2-D echo. No evidence of right-sided heart failure. Patient was taking 80 mg of Lasix at home and was switched recently to 80 mg twice a day without any help. At the time of admission she was started on IV Lasix, started on 60 mg twice a day then increased to 3 times a day. Patient did have very good response with total of 7 L of net negative balance over the past 4 days. Lower extremity edema is much better, patient will be discharged home dose of 80 mg of Lasix twice a day. To followup with her primary care physician and primary cardiologist.  2. Restless leg syndrome: While she is in the hospital patient mentioned that she has a lot of jerky involuntary movement, patient is on Requip and Neurontin for restless leg syndrome. Ferritin was checked and it's 25. Recommendation is to start iron supplementation if ferritin is less than 75. Ferrous sulfate 325 mg twice a day started, recommend to check ferritin in 6-8 weeks. I asked the  patient to take OTC vitamin C with iron pills to enhance its absorption.  3. Diabetes mellitus type 2: Seems to be controlled, glucose was normal during this hospital stay, continue home medications. Taking 1000 mg of XR metformin.  4. UTI: Urinalysis grew coagulase-negative Staphylococcus, its oxacillin sensitive. That was sensitive to ciprofloxacin, patient was  treated with ciprofloxacin the hospital for 3 days. No antibiotics was prescribed on discharge.  5. OSA: Counseled extensively coming. I encouraged her to comply with her CPAP. Recommend that she followup with primary care physician for outpatient polysomnogram or home health service to adjust her CPAP. Last time CPAP was adjusted about 2-3 years ago, she reported.  Procedures:  None  Consultations:  None  Discharge Exam: Filed Vitals:   10/05/12 0453  BP: 143/64  Pulse: 60  Temp: 98.5 F (36.9 C)  Resp: 20  General: Alert and awake, oriented x3, not in any acute distress. HEENT: anicteric sclera, pupils reactive to light and accommodation, EOMI CVS: S1-S2 clear, no murmur rubs or gallops Chest: clear to auscultation bilaterally, no wheezing, rales or rhonchi Abdomen: soft nontender, nondistended, normal bowel sounds, no organomegaly Extremities: Trace LE edema bilateraly Neuro: Cranial nerves II-XII intact, no focal neurological deficits   Discharge Instructions  Discharge Orders   Future Orders Complete By Expires   Diet Carb Modified  As directed    Increase activity slowly  As directed        Medication List         citalopram 40 MG tablet  Commonly known as:  CELEXA  Take 40 mg by mouth every morning.     ferrous sulfate 325 (65 FE) MG tablet  Take 1 tablet (325 mg total) by mouth 2 (two) times daily with a meal.     furosemide 80 MG tablet  Commonly known as:  LASIX  Take 80 mg by mouth 2 (two) times daily.     gabapentin 300 MG capsule  Commonly known as:  NEURONTIN  Take 300 mg by mouth at bedtime.     levothyroxine 75 MCG tablet  Commonly known as:  SYNTHROID, LEVOTHROID  Take 75 mcg by mouth daily before breakfast.     losartan 50 MG tablet  Commonly known as:  COZAAR  Take 1 tablet (50 mg total) by mouth daily.     metFORMIN 500 MG 24 hr tablet  Commonly known as:  GLUCOPHAGE-XR  Take 1,000 mg by mouth at bedtime.     nitroGLYCERIN 0.4 MG  SL tablet  Commonly known as:  NITROSTAT  Place 0.4 mg under the tongue every 5 (five) minutes as needed for chest pain.     rOPINIRole 2 MG tablet  Commonly known as:  REQUIP  Take 2-4 mg by mouth 3 (three) times daily. Takes 4mg  in the morning, 2mg  at noon & 4mg  at bedtime     tamoxifen 20 MG tablet  Commonly known as:  NOLVADEX  Take 20 mg by mouth at bedtime.     traZODone 25 mg Tabs tablet  Commonly known as:  DESYREL  Take 0.5 tablets (25 mg total) by mouth at bedtime.     VOLTAREN 75 MG EC tablet  Generic drug:  diclofenac  Take 75 mg by mouth 2 (two) times daily.       Allergies  Allergen Reactions  . Codeine Itching       Follow-up Information   Follow up with Estanislado Pandy, MD In 1 week.   Specialty:  Cardiology  Contact information:   [address] [place] [address] [phone]        The results of significant diagnostics from this hospitalization (including imaging, microbiology, ancillary and laboratory) are listed below for reference.    Significant Diagnostic Studies: Dg Chest 2 View  09/30/2012   *RADIOLOGY REPORT*  Clinical Data: Short of breath  CHEST - 2 VIEW  Comparison: 06/05/2012  Findings: Cardiac enlargement.  Negative for heart failure.  Lungs are clear without infiltrate effusion or mass.  IMPRESSION: No active cardiopulmonary abnormality.   Original Report Authenticated By: Janeece Riggers, M.D.    Microbiology: Recent Results (from the past 240 hour(s))  URINE CULTURE     Status: None   Collection Time    09/30/12 10:16 PM      Result Value Range Status   Specimen Description URINE, CLEAN CATCH   Final   Special Requests CX ADDED AT 2237 ON [id number]   Final   Culture  Setup Time     Final   Value: 09/30/2012 22:45     Performed at [facility] Count     Final   Value: >=100,000 COLONIES/ML     Performed at [facility]   Culture     Final   Value: STAPHYLOCOCCUS SPECIES (COAGULASE NEGATIVE)     Note:  RIFAMPIN AND GENTAMICIN SHOULD NOT BE USED AS SINGLE DRUGS FOR TREATMENT OF STAPH INFECTIONS.     Performed at [facility]   Report Status 10/02/2012 FINAL   Final   Organism ID, Bacteria STAPHYLOCOCCUS SPECIES (COAGULASE NEGATIVE)   Final  CULTURE, BLOOD (ROUTINE X 2)     Status: None   Collection Time    10/02/12  9:05 AM      Result Value Range Status   Specimen Description BLOOD RIGHT ARM   Final   Special Requests BOTTLES DRAWN AEROBIC ONLY 10CC   Final   Culture  Setup Time     Final   Value: 10/02/2012 13:19     Performed at [facility]   Culture     Final   Value:        BLOOD CULTURE RECEIVED NO GROWTH TO DATE CULTURE WILL BE HELD FOR 5 DAYS BEFORE ISSUING A FINAL NEGATIVE REPORT     Performed at [facility]   Report Status PENDING   Incomplete  CULTURE, BLOOD (ROUTINE X 2)     Status: None   Collection Time    10/02/12  9:10 AM      Result Value Range Status   Specimen Description BLOOD LEFT ARM   Final   Special Requests BOTTLES DRAWN AEROBIC ONLY 10CC   Final   Culture  Setup Time     Final   Value: 10/02/2012 13:19     Performed at [facility]   Culture     Final   Value:        BLOOD CULTURE RECEIVED NO GROWTH TO DATE CULTURE WILL BE HELD FOR 5 DAYS BEFORE ISSUING A FINAL NEGATIVE REPORT     Performed at [facility]   Report Status PENDING   Incomplete     Labs: Basic Metabolic Panel:  Recent Labs Lab 09/30/12 1934 10/01/12 0405 10/02/12 0510 10/03/12 0605 10/04/12 0520 10/05/12 0525  NA 144 141 140 139 140 139  K 3.4* 3.2* 3.9 3.6 4.1 3.6  CL 103 101 102 100 100 102  CO2 31 30 29 28  28  28  GLUCOSE 118* 143* 122* 112* 115* 128*  BUN 13 12 14 17  28* 28*  CREATININE 0.66 0.73 0.72 0.76 1.24* 0.81  CALCIUM 9.5 9.3 8.9 9.2 9.3 9.2  MG  --  2.0  --  2.0  --   --    Liver Function Tests:  Recent Labs Lab 09/30/12 1934  AST 51*  ALT 30  ALKPHOS 75  BILITOT 0.3  PROT 6.7  ALBUMIN 3.5   No results found  for this basename: LIPASE, AMYLASE,  in the last 168 hours No results found for this basename: AMMONIA,  in the last 168 hours CBC:  Recent Labs Lab 09/30/12 1934 10/01/12 0405  WBC 11.4* 10.7*  NEUTROABS 6.7  --   HGB 10.6* 10.4*  HCT 32.8* 32.5*  MCV 89.6 89.5  PLT 218 222   Cardiac Enzymes:  Recent Labs Lab 10/01/12 0140 10/01/12 0745 10/03/12 1035  CKTOTAL  --   --  142  CKMB  --   --  3.8  TROPONINI <0.30 <0.30  --    BNP: BNP (last 3 results)  Recent Labs  09/30/12 2335  PROBNP 80.9   CBG:  Recent Labs Lab 10/04/12 1113 10/04/12 1603 10/04/12 2114 10/05/12 0628 10/05/12 1128  GLUCAP 139* 142* 164* 118* 118*       Signed:  Bladen Umar A  [facility] 10/05/2012, 11:37 AM   ]

## 2012-10-08 ENCOUNTER — Other Ambulatory Visit: Payer: Self-pay | Admitting: Diagnostic Neuroimaging

## 2012-10-08 LAB — CULTURE, BLOOD (ROUTINE X 2): Culture: NO GROWTH

## 2012-10-29 ENCOUNTER — Telehealth: Payer: Self-pay | Admitting: Diagnostic Neuroimaging

## 2012-10-31 NOTE — Telephone Encounter (Signed)
I called Courtney Floyd and relayed that got message for her requip medication management. She needs appt to evaluate.  LMVM to call back.  Made appt 11-03-12 at 1300 with Dr. Marjory Lies..  Courtney Floyd needs to confirm.

## 2012-11-03 ENCOUNTER — Encounter: Payer: Self-pay | Admitting: Diagnostic Neuroimaging

## 2012-11-03 ENCOUNTER — Ambulatory Visit (INDEPENDENT_AMBULATORY_CARE_PROVIDER_SITE_OTHER): Payer: Medicare Other | Admitting: Diagnostic Neuroimaging

## 2012-11-03 VITALS — BP 151/65 | HR 88 | Temp 98.7°F | Ht 62.0 in | Wt 252.0 lb

## 2012-11-03 DIAGNOSIS — G2581 Restless legs syndrome: Secondary | ICD-10-CM

## 2012-11-03 MED ORDER — ROPINIROLE HCL 4 MG PO TABS: 4.0000 mg | ORAL_TABLET | Freq: Three times a day (TID) | ORAL | Status: DC

## 2012-11-03 NOTE — Progress Notes (Signed)
[facility]  PATIENT: Courtney Floyd DOB: 29-Jul-1950  REFERRING CLINICIAN:  HISTORY FROM: patient and husband REASON FOR VISIT: follow up   HISTORICAL  CHIEF COMPLAINT:  Chief Complaint  Patient presents with  . Follow-up    cervicalgia    HISTORY OF PRESENT ILLNESS:   UPDATE 11/03/12: Since last visit, having more leg pain, back pain, restless legs and "tremors". Having poor sleep. Larey Seat down few months ago and has a "fracture" of left tibia. Sig bilateral knee pain.  UPDATE: 09/17/11. Due to a change in copay, pt is no longer on Requip XL because she cannot afford. Her copay would be around $300.She is taking a total of 5 regular 2mg  tabs with good results and control of her RLS.   UPDATE: 05/07/11. Pt does not feel Restless legs controlled at night with d/c regular ropinirole. Her symptoms are worse at night. She has a lot of drowsiness with Gabapentin and does not take it as directed. She also reports CBG in the 160 to 180 range last few months. See ROS  UPDATE 01/11/11: Still with RLS sxs.  Higher requip has not helped.  Mainly sxs at night.    PRIOR HPI (11/07/10): 62 year old right-handed female with history of diabetes, hypercholesteremia, breast cancer, lumbar spine surgery, here for evaluation of restless leg syndrome.  For past 5 years the patient has intermittent episodes of muscle spasm, typically when she is falling asleep. Sometimes this occurs when she is sitting and watching TV.  In the past one year she has developed similar symptoms in her arms. She describes involuntary, muscle spasm and jerking movements lasting 20 seconds a time.  Her right leg seems to be more affected than the left leg. She also reports right knee pain, recent right knee replacement surgery in April 2012, and prior depression and anxiety which is now well-controlled.  Patient has been treated with ropinirol, then increased to Requip XL, now on both (Requip XL 2mg  TID +  ropinirole 2mg  qhs).  REVIEW OF SYSTEMS: Full 14 system review of systems performed and notable only for fatigue, swelling in legs, shortness of breath, anemia, joint pain, joint swelling, aching muscles, urinating problems.  ALLERGIES: Allergies  Allergen Reactions  . Codeine Itching    HOME MEDICATIONS: Outpatient Prescriptions Prior to Visit  Medication Sig Dispense Refill  . citalopram (CELEXA) 40 MG tablet Take 40 mg by mouth every morning.       . ferrous sulfate 325 (65 FE) MG tablet Take 1 tablet (325 mg total) by mouth 2 (two) times daily with a meal.  60 tablet  3  . furosemide (LASIX) 80 MG tablet Take 80 mg by mouth 2 (two) times daily.       Marland Kitchen gabapentin (NEURONTIN) 300 MG capsule Take 300 mg by mouth at bedtime.      Marland Kitchen levothyroxine (SYNTHROID, LEVOTHROID) 75 MCG tablet Take 75 mcg by mouth daily before breakfast.       . losartan (COZAAR) 50 MG tablet Take 1 tablet (50 mg total) by mouth daily.  90 tablet  3  . metFORMIN (GLUCOPHAGE-XR) 500 MG 24 hr tablet Take 1,000 mg by mouth at bedtime.       . nitroGLYCERIN (NITROSTAT) 0.4 MG SL tablet Place 0.4 mg under the tongue every 5 (five) minutes as needed for chest pain.      . tamoxifen (NOLVADEX) 20 MG tablet Take 20 mg by mouth at bedtime.       . traZODone (DESYREL)  25 mg TABS tablet Take 0.5 tablets (25 mg total) by mouth at bedtime.  30 tablet  0  . rOPINIRole (REQUIP) 2 MG tablet TAKE 2 TABLET EVERY MORNING, 1 TABLET AT NOON AND 2 TABLET EVERY NIGHT AT BEDTIME  150 tablet  0  . diclofenac (VOLTAREN) 75 MG EC tablet Take 75 mg by mouth 2 (two) times daily.       No facility-administered medications prior to visit.    PAST MEDICAL HISTORY: Past Medical History  Diagnosis Date  . Hypertension   . Diabetes   . Breast cancer   . OSA (obstructive sleep apnea)   . RLS (restless legs syndrome)   . Hyperlipidemia     PAST SURGICAL HISTORY: Past Surgical History  Procedure Laterality Date  . Tonsillectomy  1958  .  Tubal ligation  1974  . Cesarean section  1972  . Carpal tunnel release Left 1994  . Partial hysterectomy  2006  . Back surgery  2008  . Knee surgery Right   . Total knee arthroplasty Right 2008  . Breast lumpectomy  2012  . Cholecystectomy    . Carpal tunnel release Right 1975    FAMILY HISTORY: Family History  Problem Relation Age of Onset  . Asthma Father   . Heart disease Father   . Colon cancer Father   . Skin cancer Father   . Colon cancer Mother 13  . Breast cancer Mother 39  . Pneumonia Mother   . Colon cancer Brother   . Colon cancer Brother   . Hodgkin's lymphoma Son     SOCIAL HISTORY:  History   Social History  . Marital Status: Married    Spouse Name: Verneita Griffes    Number of Children: 2  . Years of Education: College   Occupational History  . Disabled     n/a   Social History Main Topics  . Smoking status: Never Smoker   . Smokeless tobacco: Never Used  . Alcohol Use: No  . Drug Use: No  . Sexual Activity: Not on file   Other Topics Concern  . Not on file   Social History Narrative   Patient lives at home with her spouse.   Caffeine Use: drinks tea daily     PHYSICAL EXAM  Filed Vitals:   11/03/12 1319  BP: 151/65  Pulse: 88  Temp: 98.7 F (37.1 C)  TempSrc: Oral  Height: 5\' 2"  (1.575 m)  Weight: 252 lb (114.306 kg)    Not recorded    Body mass index is 46.08 kg/(m^2).  GENERAL EXAM: Patient is in no distress; SIGNIFICANT PERIPHERAL EDEMA IN FEET AND LEGS  CARDIOVASCULAR: Regular rate and rhythm, no murmurs, no carotid bruits  NEUROLOGIC: MENTAL STATUS: awake, alert, language fluent, comprehension intact, naming intact CRANIAL NERVE: pupils equal and reactive to light, visual fields full to confrontation, extraocular muscles intact, no nystagmus, facial sensation and strength symmetric, uvula midline, shoulder shrug symmetric, tongue midline. MOTOR: normal bulk and tone, full strength in the BUE, BLE; INTERMITTENT  TREMORS IN LEFT LEG WHEN PATIENT HOLDS IT OUT. SENSORY: normal and symmetric to light touch COORDINATION: finger-nose-finger, fine finger movements normal REFLEXES: deep tendon reflexes ABSENT IN BLE GAIT/STATION: ANTALGIC GAIT.   DIAGNOSTIC DATA (LABS, IMAGING, TESTING) - I reviewed patient records, labs, notes, testing and imaging myself where available.  Lab Results  Component Value Date   WBC 10.7* 10/01/2012   HGB 10.4* 10/01/2012   HCT 32.5* 10/01/2012   MCV 89.5 10/01/2012  PLT 222 10/01/2012      Component Value Date/Time   NA 139 10/05/2012 0525   K 3.6 10/05/2012 0525   CL 102 10/05/2012 0525   CO2 28 10/05/2012 0525   GLUCOSE 128* 10/05/2012 0525   BUN 28* 10/05/2012 0525   CREATININE 0.81 10/05/2012 0525   CREATININE 0.85 06/16/2012 1645   CALCIUM 9.2 10/05/2012 0525   PROT 6.7 09/30/2012 1934   ALBUMIN 3.5 09/30/2012 1934   AST 51* 09/30/2012 1934   ALT 30 09/30/2012 1934   ALKPHOS 75 09/30/2012 1934   BILITOT 0.3 09/30/2012 1934   GFRNONAA 76* 10/05/2012 0525   GFRAA 88* 10/05/2012 0525   No results found for this basename: CHOL, HDL, LDLCALC, LDLDIRECT, TRIG, CHOLHDL   No results found for this basename: HGBA1C   Lab Results  Component Value Date   VITAMINB12 462 10/01/2012   Lab Results  Component Value Date   TSH 3.091 10/01/2012    11/14/10 MRI cervical spine - disc bulging from C3-4 down to C6-7.  No spinal stenosis or foraminal narrowing.    11/14/10 MRI lumbar spine - At L2-3: Disc bulging and facet hypertrophy with moderate right and severe left foraminal stenosis. At L3-4: Disc bulging and facet hypertrophy with moderate biforaminal stenosis.  At L4-5: Disc bulging and facet hypertrophy moderate right and severe left foraminal stenosis.     ASSESSMENT AND PLAN  62 y.o. female with RLS, now with increasing muscle spasms and jerking movements.  Also with significant pain in low back and legs.   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 incr ropiniriole to 4mg  TID 2.  consider referral to pain clinic   Return in about 1 year (around 11/03/2013) for with Edison Nasuti, MD 11/03/2012, 2:06 PM Certified in Neurology, Neurophysiology and Neuroimaging  [facility] [address] [place], [place] [address] [phone]

## 2012-11-03 NOTE — Patient Instructions (Signed)
Change ropinirole to 4mg  three times a day.  Ask PCP to consider changing citalopram to cymbalta.  Consider referral to pain mgmt clinic for knee pain, back pain, leg pain.

## 2012-11-18 ENCOUNTER — Other Ambulatory Visit: Payer: Self-pay | Admitting: Diagnostic Neuroimaging

## 2012-11-19 ENCOUNTER — Telehealth: Payer: Self-pay | Admitting: *Deleted

## 2012-11-19 NOTE — Telephone Encounter (Signed)
rOPINIRole (REQUIP) 4 MG tablet 90 tablet 12 11/03/2012     Sig - Route: Take 1 tablet (4 mg total) by mouth 3 (three) times daily. - Oral    E-Prescribing Status: Receipt confirmed by pharmacy (11/03/2012 2:05 PM EDT)                Pharmacy    [id number] - [place], [place] - [address] AT [facility] [address] [place] [address]

## 2012-11-19 NOTE — Telephone Encounter (Signed)
Pt calling and is having problem with medication.  I called and LMVM for pt to return call.  (ropinirole)??

## 2012-11-20 NOTE — Telephone Encounter (Signed)
I called pt, she is having problems with her RLS.  (also affecting healing of her L tibia fx).  She thinks that if she takes the requip XL regular dosing and the ropinirole prn , this may work better for her since the 4mg  po tid ropinirole is not working at all.  Sleep and healing being affected.  Please advise.

## 2012-11-25 ENCOUNTER — Telehealth: Payer: Self-pay | Admitting: Diagnostic Neuroimaging

## 2012-11-25 NOTE — Telephone Encounter (Addendum)
Spoke with patient and she said that the doctor has to send a letter to her insurance company([employer]) explaining why she needs the Requip, she said that the ropinirole is not working, legs still hurting, not healing.

## 2012-11-27 NOTE — Telephone Encounter (Signed)
I will forward this to Lucille Passy, RPhA for follow up.

## 2012-11-30 ENCOUNTER — Other Ambulatory Visit: Payer: Self-pay | Admitting: Diagnostic Neuroimaging

## 2012-12-04 ENCOUNTER — Other Ambulatory Visit: Payer: Self-pay

## 2012-12-17 ENCOUNTER — Telehealth: Payer: Self-pay

## 2012-12-17 NOTE — Telephone Encounter (Signed)
Phone call from pt. with c/o lower extremity swelling.  States she was in the hospital for a week, in September, for swelling of her legs, and now is noticing the swelling is getting worse; states the (L) > (R).  Reports she has swelling up to the knees, bilaterally, with some redness, blisters, and seeping from lower legs.  Reports she has a "fx tibia of the (L) leg that won't heal because of the jerking in my legs."  Being treated by Neurology that diagnosed "Restless Leg Syndrome."  Questioned about any sob or cough; states she does get short of breath at times, and feels like she could smother when she is laying down.   Denies any cough.  Noted in her discharge summary from 10/05/12, was to f/u with PCP and Cardiologist.  Stated she didn't know about following up with the cardiologist.  Advised pt. she may need to have her medication adjusted for fluid overload, and to call and schedule appt. with cardiologist.  Advised that the Cardiologist will determine if her symptoms are related to venous or vascular insufficiency, to determine if needs to see Dr. Arbie Cookey.  Verb. Understanding.  Agreed to call the Cardiology for appt.

## 2012-12-28 ENCOUNTER — Telehealth: Payer: Self-pay

## 2012-12-28 NOTE — Telephone Encounter (Signed)
[employer] sent [place] a letter saying they have approved our request for coverage on Requip XL effective until 01/28/2014 Ref# [id number]

## 2012-12-30 ENCOUNTER — Encounter: Payer: PRIVATE HEALTH INSURANCE | Admitting: Adult Health

## 2012-12-30 NOTE — Progress Notes (Signed)
    HPI: Courtney Floyd is a 62 year old patient of Dr. Tenny Craw, we are following for ongoing assessment and management of hypertension, lower shin edema, with history of obstructive sleep apnea and intolerance to CPAP, hyperlipidemia. The patient was discharged from [facility] on 10/05/2012 after being admitted with lower extremity edema, probable diastolic dysfunction. She was given IV Lasix and diuresis at total of 7 L. Lower STEMI edema improved. She was started on Lasix 80 mg twice a day. She is here with recurrent complaints of lower extremity edema. Weight on discharge 257 pounds in September.  Allergies  Allergen Reactions  . Codeine Itching    Current Outpatient Prescriptions  Medication Sig Dispense Refill  . citalopram (CELEXA) 40 MG tablet Take 40 mg by mouth every morning.       . ferrous sulfate 325 (65 FE) MG tablet Take 1 tablet (325 mg total) by mouth 2 (two) times daily with a meal.  60 tablet  3  . furosemide (LASIX) 80 MG tablet Take 80 mg by mouth 2 (two) times daily.       Marland Kitchen gabapentin (NEURONTIN) 300 MG capsule Take 300 mg by mouth at bedtime.      Marland Kitchen glucose blood test strip       . HYDROcodone-acetaminophen (NORCO/VICODIN) 5-325 MG per tablet Take 1 tablet by mouth as needed.      [employer] (SIMPLE DIAGNOSTICS LANCING DEV) MISC       . LANCETS ULTRA THIN MISC       . levothyroxine (SYNTHROID, LEVOTHROID) 75 MCG tablet Take 75 mcg by mouth daily before breakfast.       . losartan (COZAAR) 50 MG tablet Take 1 tablet (50 mg total) by mouth daily.  90 tablet  3  . metFORMIN (GLUCOPHAGE-XR) 500 MG 24 hr tablet Take 1,000 mg by mouth at bedtime.       . nitroGLYCERIN (NITROSTAT) 0.4 MG SL tablet Place 0.4 mg under the tongue every 5 (five) minutes as needed for chest pain.      Marland Kitchen rOPINIRole (REQUIP) 4 MG tablet Take 1 tablet (4 mg total) by mouth 3 (three) times daily.  90 tablet  12  . tamoxifen (NOLVADEX) 20 MG tablet Take 20 mg by mouth at bedtime.       .  traZODone (DESYREL) 25 mg TABS tablet Take 0.5 tablets (25 mg total) by mouth at bedtime.  30 tablet  0   No current facility-administered medications for this visit.    Past Medical History  Diagnosis Date  . Hypertension   . Diabetes   . Breast cancer   . OSA (obstructive sleep apnea)   . RLS (restless legs syndrome)   . Hyperlipidemia     Past Surgical History  Procedure Laterality Date  . Tonsillectomy  1958  . Tubal ligation  1974  . Cesarean section  1972  . Carpal tunnel release Left 1994  . Partial hysterectomy  2006  . Back surgery  2008  . Knee surgery Right   . Total knee arthroplasty Right 2008  . Breast lumpectomy  2012  . Cholecystectomy    . Carpal tunnel release Right 1975    ROS: PHYSICAL EXAM There were no vitals taken for this visit.  EKG:  ASSESSMENT AND PLAN

## 2013-01-02 ENCOUNTER — Ambulatory Visit (INDEPENDENT_AMBULATORY_CARE_PROVIDER_SITE_OTHER): Payer: PRIVATE HEALTH INSURANCE | Admitting: Adult Health

## 2013-01-02 ENCOUNTER — Encounter: Payer: Self-pay | Admitting: Adult Health

## 2013-01-02 VITALS — BP 165/56 | HR 68 | Ht 61.0 in | Wt 264.0 lb

## 2013-01-02 DIAGNOSIS — R609 Edema, unspecified: Secondary | ICD-10-CM

## 2013-01-02 DIAGNOSIS — I10 Essential (primary) hypertension: Secondary | ICD-10-CM

## 2013-01-02 DIAGNOSIS — R0602 Shortness of breath: Secondary | ICD-10-CM

## 2013-01-02 DIAGNOSIS — G2581 Restless legs syndrome: Secondary | ICD-10-CM

## 2013-01-02 MED ORDER — MAGNESIUM 400 MG PO CAPS: 2.0000 | ORAL_CAPSULE | Freq: Every day | ORAL | Status: AC

## 2013-01-02 MED ORDER — TRAMADOL HCL 50 MG PO TABS: 50.0000 mg | ORAL_TABLET | Freq: Four times a day (QID) | ORAL | Status: AC | PRN

## 2013-01-02 MED ORDER — TORSEMIDE 20 MG PO TABS: 40.0000 mg | ORAL_TABLET | Freq: Two times a day (BID) | ORAL | Status: DC

## 2013-01-02 NOTE — Patient Instructions (Addendum)
Your physician recommends that you schedule a follow-up appointment in: 1 month   Your physician has recommended you make the following change in your medication:  1. Stop Furesimide 2. Start toresimide 40 mg twice a day 3. Start Magnesium 400n mg twice a day 4. Use Tramadol every 6 hours as needed  Your physician recommends that you return for lab work in: bmet  This letter is a reminder that your labs are due   01/09/13             Please go to [facility], located across the street for [facility] on the second floor.             Hours are Monday-Friday 7am - 6pm                        Saturday  - 8 am until 12 noon   ___ DO NOT EAT OR DRINK AFTER MIDNIGHT EVENING PRIOR TO LABWORK.  __X_ YOUR LABWORK IS NOT FASTING -- YOU MAY EAT PRIOR TO LABWORK.   Your physician recommends you use your CPAP      2 Gram Low Sodium Diet A 2 gram sodium diet restricts the amount of sodium in the diet to no more than 2 g or 2000 mg daily. Limiting the amount of sodium is often used to help lower blood pressure. It is important if you have heart, liver, or kidney problems. Many foods contain sodium for flavor and sometimes as a preservative. When the amount of sodium in a diet needs to be low, it is important to know what to look for when choosing foods and drinks. The following includes some information and guidelines to help make it easier for you to adapt to a low sodium diet. QUICK TIPS  Do not add salt to food.  Avoid convenience items and fast food.  Choose unsalted snack foods.  Buy lower sodium products, often labeled as "lower sodium" or "no salt added."  Check food labels to learn how much sodium is in 1 serving.  When eating at a restaurant, ask that your food be prepared with less salt or none, if possible. READING FOOD LABELS FOR SODIUM INFORMATION The nutrition facts label is a good place to find how much sodium is in foods. Look for products with no more than  500 to 600 mg of sodium per meal and no more than 150 mg per serving. Remember that 2 g = 2000 mg. The food label may also list foods as:  Sodium-free: Less than 5 mg in a serving.  Very low sodium: 35 mg or less in a serving.  Low-sodium: 140 mg or less in a serving.  Light in sodium: 50% less sodium in a serving. For example, if a food that usually has 300 mg of sodium is changed to become light in sodium, it will have 150 mg of sodium.  Reduced sodium: 25% less sodium in a serving. For example, if a food that usually has 400 mg of sodium is changed to reduced sodium, it will have 300 mg of sodium. CHOOSING FOODS Grains  Avoid: Salted crackers and snack items. Some cereals, including instant hot cereals. Bread stuffing and biscuit mixes. Seasoned rice or pasta mixes.  Choose: Unsalted snack items. Low-sodium cereals, oats, puffed wheat and rice, shredded wheat. English muffins and bread. Pasta. Meats  Avoid: Salted, canned, smoked, spiced, pickled meats, including fish and poultry. Bacon, ham, sausage, cold cuts, hot  dogs, anchovies.  Choose: Low-sodium canned tuna and salmon. Fresh or frozen meat, poultry, and fish. Dairy  Avoid: Processed cheese and spreads. Cottage cheese. Buttermilk and condensed milk. Regular cheese.  Choose: Milk. Low-sodium cottage cheese. Yogurt. Sour cream. Low-sodium cheese. Fruits and Vegetables  Avoid: Regular canned vegetables. Regular canned tomato sauce and paste. Frozen vegetables in sauces. Olives. Rosita Fire. Relishes. Sauerkraut.  Choose: Low-sodium canned vegetables. Low-sodium tomato sauce and paste. Frozen or fresh vegetables. Fresh and frozen fruit. Condiments  Avoid: Canned and packaged gravies. Worcestershire sauce. Tartar sauce. Barbecue sauce. Soy sauce. Steak sauce. Ketchup. Onion, garlic, and table salt. Meat flavorings and tenderizers.  Choose: Fresh and dried herbs and spices. Low-sodium varieties of mustard and ketchup. Lemon  juice. Tabasco sauce. Horseradish. SAMPLE 2 GRAM SODIUM MEAL PLAN Breakfast / Sodium (mg)  1 cup low-fat milk / 143 mg  2 slices whole-wheat toast / 270 mg  1 tbs heart-healthy margarine / 153 mg  1 hard-boiled egg / 139 mg  1 small orange / 0 mg Lunch / Sodium (mg)  1 cup raw carrots / 76 mg   cup hummus / 298 mg  1 cup low-fat milk / 143 mg   cup red grapes / 2 mg  1 whole-wheat pita bread / 356 mg Dinner / Sodium (mg)  1 cup whole-wheat pasta / 2 mg  1 cup low-sodium tomato sauce / 73 mg  3 oz lean ground beef / 57 mg  1 small side salad (1 cup raw spinach leaves,  cup cucumber,  cup yellow bell pepper) with 1 tsp olive oil and 1 tsp red wine vinegar / 25 mg Snack / Sodium (mg)  1 container low-fat vanilla yogurt / 107 mg  3 graham cracker squares / 127 mg Nutrient Analysis  Calories: 2033  Protein: 77 g  Carbohydrate: 282 g  Fat: 72 g  Sodium: 1971 mg Document Released: 01/15/2005 Document Revised: 04/09/2011 Document Reviewed: 04/18/2009 Exit[employer] Patient Information 2014 [place], [place].

## 2013-01-02 NOTE — Progress Notes (Signed)
HPI: [name] is a 62 year old female patient of Dr. Tenny Craw we are following for ongoing assessment and management of hypertension, dyspnea, and lower extremity edema. She has ongoing history of diabetes. She was last seen by Dr. Tenny Craw in May of 2014. Multiple tests were ordered. Lisinopril HCTZ was discontinued and changed to Cozaar 100 mg. On a protocol was increased to twice a day. She was referred to pulmonary for assessment of dyspnea.   The patient was recently admitted to the hospital in September 2014 with complaints of lower extremity edema and shortness of breath. The patient had a 2-D echocardiogram which revealed normal LV systolic function. Lasix was provided during hospitalization, with 7 L net negative balance over the 4 days of admission. The patient was sent home on by mouth Lasix 80 mg twice a day. She was advised to continue with pulmonology and with compliance concerning CPAP.    She comes today having gained 12 lbs and is having worsening edema. She admits to not taking lasix as directed,"because it makes me go to the bathroom too much." She is only taking one 80 mg tablet instead of BID. She is not using CPAP, and is eating salty foods. She also complains of restless leg syndrome and chronic back pain. She uses crutches for ambulation.      Allergies  Allergen Reactions  . Codeine Itching    Current Outpatient Prescriptions  Medication Sig Dispense Refill  . amoxicillin (AMOXIL) 500 MG capsule       . citalopram (CELEXA) 40 MG tablet Take 40 mg by mouth every morning.       . ferrous sulfate 325 (65 FE) MG tablet Take 1 tablet (325 mg total) by mouth 2 (two) times daily with a meal.  60 tablet  3  . gabapentin (NEURONTIN) 300 MG capsule Take 300 mg by mouth at bedtime.      Marland Kitchen glucose blood test strip       . HYDROcodone-acetaminophen (NORCO/VICODIN) 5-325 MG per tablet Take 1 tablet by mouth as needed.      [employer] (SIMPLE DIAGNOSTICS LANCING DEV) MISC       .  LANCETS ULTRA THIN MISC       . levothyroxine (SYNTHROID, LEVOTHROID) 75 MCG tablet Take 75 mcg by mouth daily before breakfast.       . losartan (COZAAR) 50 MG tablet Take 1 tablet (50 mg total) by mouth daily.  90 tablet  3  . Magnesium 400 MG CAPS Take 2 capsules by mouth daily.  60 capsule  3  . metFORMIN (GLUCOPHAGE-XR) 500 MG 24 hr tablet Take 1,000 mg by mouth at bedtime.       . nitroGLYCERIN (NITROSTAT) 0.4 MG SL tablet Place 0.4 mg under the tongue every 5 (five) minutes as needed for chest pain.      Marland Kitchen oxyCODONE-acetaminophen (PERCOCET/ROXICET) 5-325 MG per tablet       . rOPINIRole (REQUIP) 4 MG tablet Take 1 tablet (4 mg total) by mouth 3 (three) times daily.  90 tablet  12  . tamoxifen (NOLVADEX) 20 MG tablet Take 20 mg by mouth at bedtime.       . torsemide (DEMADEX) 20 MG tablet Take 2 tablets (40 mg total) by mouth 2 (two) times daily.  120 tablet  3  . traMADol (ULTRAM) 50 MG tablet Take 1 tablet (50 mg total) by mouth every 6 (six) hours as needed.  120 tablet  0  . traZODone (DESYREL) 25  mg TABS tablet Take 0.5 tablets (25 mg total) by mouth at bedtime.  30 tablet  0   No current facility-administered medications for this visit.    Past Medical History  Diagnosis Date  . Hypertension   . Diabetes   . Breast cancer   . OSA (obstructive sleep apnea)   . RLS (restless legs syndrome)   . Hyperlipidemia     Past Surgical History  Procedure Laterality Date  . Tonsillectomy  1958  . Tubal ligation  1974  . Cesarean section  1972  . Carpal tunnel release Left 1994  . Partial hysterectomy  2006  . Back surgery  2008  . Knee surgery Right   . Total knee arthroplasty Right 2008  . Breast lumpectomy  2012  . Cholecystectomy    . Carpal tunnel release Right 1975    WJX:BJYNWG of systems complete and found to be negative unless listed above  PHYSICAL EXAM BP 165/56  Pulse 68  Ht 5\' 1"  (1.549 m)  Wt 264 lb (119.75 kg)  BMI 49.91 kg/m2  SpO2 98% General: Well  developed, well nourished, in no acute distress Head: Eyes PERRLA, No xanthomas.   Normal cephalic and atramatic  Lungs: Clear bilaterally to auscultation and percussion. Heart: HRRR S1 S2, without MRG.  Pulses are 2+ & equal.            No carotid bruit. No JVD.  No abdominal bruits. No femoral bruits. Abdomen: Bowel sounds are positive, abdomen obese, soft and non-tender without masses or                  Hernia's noted. Msk:  Back normal, slow gait. Normal strength and tone for age. Extremities: No clubbing, cyanosis 2+ edema to the knees  DP +1 Neuro: Alert and oriented X 3. Psych:  Good affect, responds appropriately  EKG: NSR rate of 62 bpm.   ASSESSMENT AND PLAN

## 2013-01-02 NOTE — Assessment & Plan Note (Signed)
Multifactorial. She is very sedentary, is using [facility] as she was told it was better for her, and is not taking diuretic as directed. I am changing her to torsemide, 40 mg BID for better bioavailability, Will repeat BMET in one week. She is to weigh herself daily. Keep her feet elevated, and take the diuretic on empty stomach. I have counseled her on need to take medications as directed to avoid symptoms of LEE. She is asked to participate in her health maintenance.

## 2013-01-02 NOTE — Progress Notes (Deleted)
Name: Courtney Floyd    DOB: July 13, 1950  Age: 62 y.o.  MR#: [id number]       PCP:  Estanislado Pandy, MD      Insurance: Payor: [employer] MEDICARE / Plan: SECURE HORIZONS DIRECT / Product Type: *No Product type* /   CC:    Chief Complaint  Patient presents with  . Shortness of Breath  . Hypertension    VS Filed Vitals:   01/02/13 1558  BP: 165/56  Pulse: 68  Height: 5\' 1"  (1.549 m)  Weight: 264 lb (119.75 kg)  SpO2: 98%    Weights Current Weight  01/02/13 264 lb (119.75 kg)  11/03/12 252 lb (114.306 kg)  10/05/12 257 lb 14.4 oz (116.983 kg)    Blood Pressure  BP Readings from Last 3 Encounters:  01/02/13 165/56  11/03/12 151/65  10/05/12 143/64     Admit date:  (Not on file) Last encounter with RMR:  Visit date not found   Allergy Codeine  Current Outpatient Prescriptions  Medication Sig Dispense Refill  . citalopram (CELEXA) 40 MG tablet Take 40 mg by mouth every morning.       . ferrous sulfate 325 (65 FE) MG tablet Take 1 tablet (325 mg total) by mouth 2 (two) times daily with a meal.  60 tablet  3  . furosemide (LASIX) 80 MG tablet Take 80 mg by mouth 2 (two) times daily.       Marland Kitchen gabapentin (NEURONTIN) 300 MG capsule Take 300 mg by mouth at bedtime.      Marland Kitchen glucose blood test strip       . HYDROcodone-acetaminophen (NORCO/VICODIN) 5-325 MG per tablet Take 1 tablet by mouth as needed.      [employer] (SIMPLE DIAGNOSTICS LANCING DEV) MISC       . LANCETS ULTRA THIN MISC       . levothyroxine (SYNTHROID, LEVOTHROID) 75 MCG tablet Take 75 mcg by mouth daily before breakfast.       . losartan (COZAAR) 50 MG tablet Take 1 tablet (50 mg total) by mouth daily.  90 tablet  3  . metFORMIN (GLUCOPHAGE-XR) 500 MG 24 hr tablet Take 1,000 mg by mouth at bedtime.       . nitroGLYCERIN (NITROSTAT) 0.4 MG SL tablet Place 0.4 mg under the tongue every 5 (five) minutes as needed for chest pain.      Marland Kitchen rOPINIRole (REQUIP) 4 MG tablet Take 1 tablet (4 mg total) by mouth  3 (three) times daily.  90 tablet  12  . tamoxifen (NOLVADEX) 20 MG tablet Take 20 mg by mouth at bedtime.       . traZODone (DESYREL) 25 mg TABS tablet Take 0.5 tablets (25 mg total) by mouth at bedtime.  30 tablet  0   No current facility-administered medications for this visit.    Discontinued Meds:   There are no discontinued medications.  Patient Active Problem List   Diagnosis Date Noted  . Bilateral swelling of feet 10/01/2012  . Hypertension   . Diabetes   . OSA (obstructive sleep apnea)   . Hyperlipidemia   . Pain in limb 06/24/2012  . Swelling of limb 06/24/2012  . Cough 06/18/2012  . SOB (shortness of breath) 06/16/2012  . Edema 06/16/2012  . RLS (restless legs syndrome) 06/16/2012  . HTN (hypertension) 06/16/2012  . Family history of colon cancer 06/16/2012    LABS    Component Value Date/Time   NA 139 10/05/2012 0525  NA 140 10/04/2012 0520   NA 139 10/03/2012 0605   K 3.6 10/05/2012 0525   K 4.1 10/04/2012 0520   K 3.6 10/03/2012 0605   CL 102 10/05/2012 0525   CL 100 10/04/2012 0520   CL 100 10/03/2012 0605   CO2 28 10/05/2012 0525   CO2 28 10/04/2012 0520   CO2 28 10/03/2012 0605   GLUCOSE 128* 10/05/2012 0525   GLUCOSE 115* 10/04/2012 0520   GLUCOSE 112* 10/03/2012 0605   BUN 28* 10/05/2012 0525   BUN 28* 10/04/2012 0520   BUN 17 10/03/2012 0605   CREATININE 0.81 10/05/2012 0525   CREATININE 1.24* 10/04/2012 0520   CREATININE 0.76 10/03/2012 0605   CREATININE 0.85 06/16/2012 1645   CALCIUM 9.2 10/05/2012 0525   CALCIUM 9.3 10/04/2012 0520   CALCIUM 9.2 10/03/2012 0605   GFRNONAA 76* 10/05/2012 0525   GFRNONAA 45* 10/04/2012 0520   GFRNONAA 88* 10/03/2012 0605   GFRAA 88* 10/05/2012 0525   GFRAA 52* 10/04/2012 0520   GFRAA >90 10/03/2012 0605   CMP     Component Value Date/Time   NA 139 10/05/2012 0525   K 3.6 10/05/2012 0525   CL 102 10/05/2012 0525   CO2 28 10/05/2012 0525   GLUCOSE 128* 10/05/2012 0525   BUN 28* 10/05/2012 0525   CREATININE 0.81 10/05/2012 0525   CREATININE 0.85 06/16/2012 1645    CALCIUM 9.2 10/05/2012 0525   PROT 6.7 09/30/2012 1934   ALBUMIN 3.5 09/30/2012 1934   AST 51* 09/30/2012 1934   ALT 30 09/30/2012 1934   ALKPHOS 75 09/30/2012 1934   BILITOT 0.3 09/30/2012 1934   GFRNONAA 76* 10/05/2012 0525   GFRAA 88* 10/05/2012 0525       Component Value Date/Time   WBC 10.7* 10/01/2012 0405   WBC 11.4* 09/30/2012 1934   WBC 9.9 05/31/2010 0430   HGB 10.4* 10/01/2012 0405   HGB 10.6* 09/30/2012 1934   HGB 9.8* 05/31/2010 0430   HCT 32.5* 10/01/2012 0405   HCT 32.8* 09/30/2012 1934   HCT 31.2* 05/31/2010 0430   MCV 89.5 10/01/2012 0405   MCV 89.6 09/30/2012 1934   MCV 91.5 05/31/2010 0430    Lipid Panel  No results found for this basename: chol, trig, hdl, cholhdl, vldl, ldlcalc    ABG No results found for this basename: phart, pco2, pco2art, po2, po2art, hco3, tco2, acidbasedef, o2sat     Lab Results  Component Value Date   TSH 3.091 10/01/2012   BNP (last 3 results)  Recent Labs  09/30/12 2335  PROBNP 80.9   Cardiac Panel (last 3 results) No results found for this basename: CKTOTAL, CKMB, TROPONINI, RELINDX,  in the last 72 hours  Iron/TIBC/Ferritin    Component Value Date/Time   IRON 58 10/01/2012 0405   TIBC 443 10/01/2012 0405   FERRITIN 25 10/01/2012 0405     EKG Orders placed in visit on 01/02/13  . EKG 12-LEAD     Prior Assessment and Plan Problem List as of 01/02/2013   SOB (shortness of breath)   Edema   RLS (restless legs syndrome)   HTN (hypertension)   Family history of colon cancer   Cough   Last Assessment & Plan   06/18/2012 Office Visit Written 06/19/2012  8:48 AM by Nyoka Cowden, MD      Classic Upper airway cough syndrome, so named because it's frequently impossible to sort out how much is  CR/sinusitis with freq throat clearing (which can be related to primary  GERD)   vs  causing  secondary (" extra esophageal")  GERD from wide swings in gastric pressure that occur with throat clearing, often  promoting self use of mint and menthol lozenges that reduce the  lower esophageal sphincter tone and exacerbate the problem further in a cyclical fashion.   These are the same pts (now being labeled as having "irritable larynx syndrome" by some cough centers) who not infrequently have a history of having failed to tolerate ace inhibitors,  dry powder inhalers or biphosphonates or report having atypical reflux symptoms that don't respond to standard doses of PPI , and are easily confused as having aecopd or asthma flares by even experienced allergists/ pulmonologists.   rec continue off acei, shut down cyclical cough and rx gerd then regroup if not resolved  No evidence of asthma or copd    Pain in limb   Swelling of limb   Bilateral swelling of feet   Hypertension   Diabetes   OSA (obstructive sleep apnea)   Hyperlipidemia       Imaging: No results found.

## 2013-01-02 NOTE — Progress Notes (Deleted)
Name: Courtney Floyd    DOB: 12/26/1950  Age: 62 y.o.  MR#: [id number]       PCP:  Estanislado Pandy, MD      Insurance: Payor: [employer]vertising copy[employer] MEDICARE / Plan: SECURE HORIZONS DIRECT / Product Type: *No Product type* /   CC:    Chief Complaint  Patient presents with  . Shortness of Breath  . Hypertension    VS Filed Vitals:   01/02/13 1558  BP: 165/56  Pulse: 68  Height: 5\' 1"  (1.549 m)  Weight: 264 lb (119.75 kg)  SpO2: 98%    Weights Current Weight  01/02/13 264 lb (119.75 kg)  11/03/12 252 lb (114.306 kg)  10/05/12 257 lb 14.4 oz (116.983 kg)    Blood Pressure  BP Readings from Last 3 Encounters:  01/02/13 165/56  11/03/12 151/65  10/05/12 143/64     Admit date:  (Not on file) Last encounter with RMR:  Visit date not found   Allergy Codeine  Current Outpatient Prescriptions  Medication Sig Dispense Refill  . citalopram (CELEXA) 40 MG tablet Take 40 mg by mouth every morning.       . ferrous sulfate 325 (65 FE) MG tablet Take 1 tablet (325 mg total) by mouth 2 (two) times daily with a meal.  60 tablet  3  . furosemide (LASIX) 80 MG tablet Take 80 mg by mouth 2 (two) times daily.       Marland Kitchen gabapentin (NEURONTIN) 300 MG capsule Take 300 mg by mouth at bedtime.      Marland Kitchen glucose blood test strip       . HYDROcodone-acetaminophen (NORCO/VICODIN) 5-325 MG per tablet Take 1 tablet by mouth as needed.      Dem[employer] (SIMPLE DIAGNOSTICS LANCING DEV) MISC       . LANCETS ULTRA THIN MISC       . levothyroxine (SYNTHROID, LEVOTHROID) 75 MCG tablet Take 75 mcg by mouth daily before breakfast.       . losartan (COZAAR) 50 MG tablet Take 1 tablet (50 mg total) by mouth daily.  90 tablet  3  . metFORMIN (GLUCOPHAGE-XR) 500 MG 24 hr tablet Take 1,000 mg by mouth at bedtime.       . nitroGLYCERIN (NITROSTAT) 0.4 MG SL tablet Place 0.4 mg under the tongue every 5 (five) minutes as needed for chest pain.      Marland Kitchen rOPINIRole (REQUIP) 4 MG tablet Take 1 tablet (4 mg total) by mouth  3 (three) times daily.  90 tablet  12  . tamoxifen (NOLVADEX) 20 MG tablet Take 20 mg by mouth at bedtime.       . traZODone (DESYREL) 25 mg TABS tablet Take 0.5 tablets (25 mg total) by mouth at bedtime.  30 tablet  0   No current facility-administered medications for this visit.    Discontinued Meds:   There are no discontinued medications.  Patient Active Problem List   Diagnosis Date Noted  . Bilateral swelling of feet 10/01/2012  . Hypertension   . Diabetes   . OSA (obstructive sleep apnea)   . Hyperlipidemia   . Pain in limb 06/24/2012  . Swelling of limb 06/24/2012  . Cough 06/18/2012  . SOB (shortness of breath) 06/16/2012  . Edema 06/16/2012  . RLS (restless legs syndrome) 06/16/2012  . HTN (hypertension) 06/16/2012  . Family history of colon cancer 06/16/2012    LABS    Component Value Date/Time   NA 139 10/05/2012 0525  NA 140 10/04/2012 0520   NA 139 10/03/2012 0605   K 3.6 10/05/2012 0525   K 4.1 10/04/2012 0520   K 3.6 10/03/2012 0605   CL 102 10/05/2012 0525   CL 100 10/04/2012 0520   CL 100 10/03/2012 0605   CO2 28 10/05/2012 0525   CO2 28 10/04/2012 0520   CO2 28 10/03/2012 0605   GLUCOSE 128* 10/05/2012 0525   GLUCOSE 115* 10/04/2012 0520   GLUCOSE 112* 10/03/2012 0605   BUN 28* 10/05/2012 0525   BUN 28* 10/04/2012 0520   BUN 17 10/03/2012 0605   CREATININE 0.81 10/05/2012 0525   CREATININE 1.24* 10/04/2012 0520   CREATININE 0.76 10/03/2012 0605   CREATININE 0.85 06/16/2012 1645   CALCIUM 9.2 10/05/2012 0525   CALCIUM 9.3 10/04/2012 0520   CALCIUM 9.2 10/03/2012 0605   GFRNONAA 76* 10/05/2012 0525   GFRNONAA 45* 10/04/2012 0520   GFRNONAA 88* 10/03/2012 0605   GFRAA 88* 10/05/2012 0525   GFRAA 52* 10/04/2012 0520   GFRAA >90 10/03/2012 0605   CMP     Component Value Date/Time   NA 139 10/05/2012 0525   K 3.6 10/05/2012 0525   CL 102 10/05/2012 0525   CO2 28 10/05/2012 0525   GLUCOSE 128* 10/05/2012 0525   BUN 28* 10/05/2012 0525   CREATININE 0.81 10/05/2012 0525   CREATININE 0.85 06/16/2012 1645    CALCIUM 9.2 10/05/2012 0525   PROT 6.7 09/30/2012 1934   ALBUMIN 3.5 09/30/2012 1934   AST 51* 09/30/2012 1934   ALT 30 09/30/2012 1934   ALKPHOS 75 09/30/2012 1934   BILITOT 0.3 09/30/2012 1934   GFRNONAA 76* 10/05/2012 0525   GFRAA 88* 10/05/2012 0525       Component Value Date/Time   WBC 10.7* 10/01/2012 0405   WBC 11.4* 09/30/2012 1934   WBC 9.9 05/31/2010 0430   HGB 10.4* 10/01/2012 0405   HGB 10.6* 09/30/2012 1934   HGB 9.8* 05/31/2010 0430   HCT 32.5* 10/01/2012 0405   HCT 32.8* 09/30/2012 1934   HCT 31.2* 05/31/2010 0430   MCV 89.5 10/01/2012 0405   MCV 89.6 09/30/2012 1934   MCV 91.5 05/31/2010 0430    Lipid Panel  No results found for this basename: chol, trig, hdl, cholhdl, vldl, ldlcalc    ABG No results found for this basename: phart, pco2, pco2art, po2, po2art, hco3, tco2, acidbasedef, o2sat     Lab Results  Component Value Date   TSH 3.091 10/01/2012   BNP (last 3 results)  Recent Labs  09/30/12 2335  PROBNP 80.9   Cardiac Panel (last 3 results) No results found for this basename: CKTOTAL, CKMB, TROPONINI, RELINDX,  in the last 72 hours  Iron/TIBC/Ferritin    Component Value Date/Time   IRON 58 10/01/2012 0405   TIBC 443 10/01/2012 0405   FERRITIN 25 10/01/2012 0405     EKG Orders placed in visit on 01/02/13  . EKG 12-LEAD     Prior Assessment and Plan Problem List as of 01/02/2013     Cardiovascular and Mediastinum   HTN (hypertension)   Hypertension     Respiratory   OSA (obstructive sleep apnea)     Endocrine   Diabetes     Other   SOB (shortness of breath)   Edema   RLS (restless legs syndrome)   Family history of colon cancer   Cough   Last Assessment & Plan   06/18/2012 Office Visit Written 06/19/2012  8:48 AM by Nyoka Cowden, MD  Classic Upper airway cough syndrome, so named because it's frequently impossible to sort out how much is  CR/sinusitis with freq throat clearing (which can be related to primary GERD)   vs  causing  secondary (" extra esophageal")   GERD from wide swings in gastric pressure that occur with throat clearing, often  promoting self use of mint and menthol lozenges that reduce the lower esophageal sphincter tone and exacerbate the problem further in a cyclical fashion.   These are the same pts (now being labeled as having "irritable larynx syndrome" by some cough centers) who not infrequently have a history of having failed to tolerate ace inhibitors,  dry powder inhalers or biphosphonates or report having atypical reflux symptoms that don't respond to standard doses of PPI , and are easily confused as having aecopd or asthma flares by even experienced allergists/ pulmonologists.   rec continue off acei, shut down cyclical cough and rx gerd then regroup if not resolved  No evidence of asthma or copd    Pain in limb   Swelling of limb   Bilateral swelling of feet   Hyperlipidemia       Imaging: No results found.

## 2013-01-02 NOTE — Assessment & Plan Note (Signed)
This is multifactorial, to include dietary non-compliance and CPAP non-compliance. She is elevated here in the office. I have discussed with her the need to wear her CPAP, decrease salt in her diet. She is to take medications as directed.

## 2013-01-02 NOTE — Assessment & Plan Note (Signed)
I have provided her with magnesium 400 mg BID, and tramadol 50 mg Q 6 hours, prn.

## 2013-02-09 ENCOUNTER — Ambulatory Visit: Payer: PRIVATE HEALTH INSURANCE | Admitting: Cardiology

## 2013-02-13 ENCOUNTER — Encounter: Payer: Self-pay | Admitting: Cardiology

## 2013-02-13 ENCOUNTER — Ambulatory Visit (INDEPENDENT_AMBULATORY_CARE_PROVIDER_SITE_OTHER): Payer: PRIVATE HEALTH INSURANCE | Admitting: Cardiology

## 2013-02-13 VITALS — BP 141/63 | HR 69 | Ht 61.5 in | Wt 264.2 lb

## 2013-02-13 DIAGNOSIS — I890 Lymphedema, not elsewhere classified: Secondary | ICD-10-CM

## 2013-02-13 DIAGNOSIS — I10 Essential (primary) hypertension: Secondary | ICD-10-CM

## 2013-02-13 NOTE — Assessment & Plan Note (Signed)
Followed by Dr. Quintin Alto, currently on Cozaar.

## 2013-02-13 NOTE — Progress Notes (Signed)
Clinical Summary [name] is a 63 y.o.female presenting for an office visit, a former patient of Dr. Harrington Challenger most recently seen by Courtney Floyd in December 2014. This is our first meeting. Records were reviewed. At the time of her last visit, patient was started on Demadex 40 mg twice daily by Courtney Floyd. BMET was ordered, although I do not see that lab work was obtained.  She has a history of chronic leg edema, at least several months. On exam this looks to be lymphedema/edema, also involves the dorsum of her feet. She has not been using Demadex as recommended previously, she also has lower extremity compression stockings which he does not use. She reports a number of complaints involving her legs including chronic pain, restless legs, prior fractured tibia with chronic knee pain, also hip pain. She states that she has seen a pain specialist previously.  Weight is the same as in December, 264 pounds.  Echocardiogram from May 2014 revealed LVEF 60-65%, no discussion of diastolic function, mildly dilated left atrium.   Allergies  Allergen Reactions  . Codeine Itching    Current Outpatient Prescriptions  Medication Sig Dispense Refill  . citalopram (CELEXA) 40 MG tablet Take 40 mg by mouth every morning.       . ferrous sulfate 325 (65 FE) MG tablet Take 1 tablet (325 mg total) by mouth 2 (two) times daily with a meal.  60 tablet  3  . gabapentin (NEURONTIN) 300 MG capsule Take 300 mg by mouth at bedtime.      Marland Kitchen glucose blood test strip       . HYDROcodone-acetaminophen (NORCO/VICODIN) 5-325 MG per tablet Take 1 tablet by mouth as needed.      Elmore Gu[employer] Devices (SIMPLE DIAGNOSTICS LANCING DEV) MISC       . LANCETS ULTRA THIN MISC       . levothyroxine (SYNTHROID, LEVOTHROID) 75 MCG tablet Take 75 mcg by mouth daily before breakfast.       . losartan (COZAAR) 50 MG tablet Take 1 tablet (50 mg total) by mouth daily.  90 tablet  3  . Magnesium 400 MG CAPS Take 2 capsules by mouth  daily.  60 capsule  3  . metFORMIN (GLUCOPHAGE-XR) 500 MG 24 hr tablet Take 1,000 mg by mouth at bedtime.       Marland Kitchen oxyCODONE-acetaminophen (PERCOCET/ROXICET) 5-325 MG per tablet       . tamoxifen (NOLVADEX) 20 MG tablet Take 20 mg by mouth at bedtime.       . torsemide (DEMADEX) 20 MG tablet Take 40 mg by mouth 2 (two) times daily. PT ADVISED SHE MAINLY TAKES 20-30MG  TWICE DAILY      . traMADol (ULTRAM) 50 MG tablet Take 1 tablet (50 mg total) by mouth every 6 (six) hours as needed.  120 tablet  0  . traZODone (DESYREL) 25 mg TABS tablet Take 0.5 tablets (25 mg total) by mouth at bedtime.  30 tablet  0  . nitroGLYCERIN (NITROSTAT) 0.4 MG SL tablet Place 0.4 mg under the tongue every 5 (five) minutes as needed for chest pain.      Marland Kitchen rOPINIRole (REQUIP) 4 MG tablet Take 1 tablet (4 mg total) by mouth 3 (three) times daily.  90 tablet  12   No current facility-administered medications for this visit.    Past Medical History  Diagnosis Date  . Essential hypertension, benign   . Type 2 diabetes mellitus   . Breast cancer   .  OSA (obstructive sleep apnea)   . RLS (restless legs syndrome)   . Hyperlipidemia     Social History Courtney Floyd reports that she has never smoked. She has never used smokeless tobacco. Ms. Bromell reports that she does not drink alcohol.  Review of Systems No palpitations or chest pain. Stable appetite. No regular exercise. Uses crutches. Otherwise negative.  Physical Examination Filed Vitals:   02/13/13 1311  BP: 141/63  Pulse: 69   Filed Weights   02/13/13 1311  Weight: 264 lb 4 oz (119.863 kg)   Morbidly obese, short statured woman in no distress. HEENT: Conjunctiva and lids normal, oropharynx clear. Neck: Supple, no elevated JVP or carotid bruits, no thyromegaly. Lungs: Clear to auscultation, nonlabored breathing at rest. Cardiac: Regular rate and rhythm, no S3 or significant systolic murmur. Abdomen: Soft, nontender, protuberant, bowel sounds  present. Extremities: Chronic appearing lymphedema involving the dorsum of the feet, stasis changes, distal pulses 1-2+. Skin: Warm and dry. Musculoskeletal: No kyphosis. Neuropsychiatric: Alert and oriented x3, affect grossly appropriate.   Problem List and Plan   Lymphedema Patient to use Demadex 40 mg twice daily for now, prescription written for her to get BMET with Dr. Quintin Alto next week to followup on renal function and electrolytes. If she could be consistent with her diuretic, elevate her legs, she might be able to get compression stockings: on and improve this some more. Followup with a vein and vascular specialist would also be a good idea.  HTN (hypertension) Followed by Dr. Quintin Alto, currently on Cozaar.    Satira Sark, M.D., F.A.C.C.

## 2013-02-13 NOTE — Assessment & Plan Note (Signed)
Patient to use Demadex 40 mg twice daily for now, prescription written for her to get BMET with Dr. Quintin Alto next week to followup on renal function and electrolytes. If she could be consistent with her diuretic, elevate her legs, she might be able to get compression stockings: on and improve this some more. Followup with a vein and vascular specialist would also be a good idea.

## 2013-02-13 NOTE — Patient Instructions (Addendum)
Your physician recommends that you schedule a follow-up appointment in: [place] physician recommends that you return for lab work in: [place] PCP OFFICE ([place] BMET)  Your physician has recommended you make the following change in your medication:  1) TAKE YOUR DEMADEX 40MG  TWICE DAILY AS DIRECTED

## 2013-03-05 ENCOUNTER — Encounter: Payer: Self-pay | Admitting: Cardiology

## 2013-05-04 ENCOUNTER — Other Ambulatory Visit: Payer: Self-pay | Admitting: Adult Health

## 2013-06-25 ENCOUNTER — Telehealth: Payer: Self-pay

## 2013-06-25 NOTE — Telephone Encounter (Signed)
Called patient. Scheduled earlier appt w/ Dr. Leta Baptist per request. PA-Miranda sent OV notes also.

## 2013-07-15 ENCOUNTER — Encounter: Payer: Self-pay | Admitting: Diagnostic Neuroimaging

## 2013-07-15 ENCOUNTER — Other Ambulatory Visit: Payer: Self-pay | Admitting: Internal Medicine

## 2013-07-15 ENCOUNTER — Ambulatory Visit (INDEPENDENT_AMBULATORY_CARE_PROVIDER_SITE_OTHER): Payer: Medicare Other | Admitting: Diagnostic Neuroimaging

## 2013-07-15 VITALS — BP 136/63 | HR 69 | Ht 61.5 in | Wt 266.0 lb

## 2013-07-15 DIAGNOSIS — G2581 Restless legs syndrome: Secondary | ICD-10-CM

## 2013-07-15 DIAGNOSIS — E1349 Other specified diabetes mellitus with other diabetic neurological complication: Secondary | ICD-10-CM

## 2013-07-15 DIAGNOSIS — E1142 Type 2 diabetes mellitus with diabetic polyneuropathy: Secondary | ICD-10-CM

## 2013-07-15 DIAGNOSIS — M5416 Radiculopathy, lumbar region: Secondary | ICD-10-CM

## 2013-07-15 DIAGNOSIS — E0842 Diabetes mellitus due to underlying condition with diabetic polyneuropathy: Secondary | ICD-10-CM

## 2013-07-15 DIAGNOSIS — R252 Cramp and spasm: Secondary | ICD-10-CM

## 2013-07-15 DIAGNOSIS — IMO0002 Reserved for concepts with insufficient information to code with codable children: Secondary | ICD-10-CM

## 2013-07-15 MED ORDER — ROPINIROLE HCL 2 MG PO TABS: 2.0000 mg | ORAL_TABLET | Freq: Every day | ORAL | Status: AC

## 2013-07-15 MED ORDER — ROPINIROLE HCL 4 MG PO TABS: 4.0000 mg | ORAL_TABLET | Freq: Every day | ORAL | Status: AC

## 2013-07-15 MED ORDER — GABAPENTIN 300 MG PO CAPS: 300.0000 mg | ORAL_CAPSULE | Freq: Three times a day (TID) | ORAL | Status: AC

## 2013-07-15 MED ORDER — ROPINIROLE HCL 1 MG PO TABS: 1.0000 mg | ORAL_TABLET | Freq: Every day | ORAL | Status: AC

## 2013-07-15 NOTE — Progress Notes (Signed)
[facility]  PATIENT: Courtney Floyd DOB: 1950-09-19  REFERRING CLINICIAN:  HISTORY FROM: patient and husband REASON FOR VISIT: follow up   HISTORICAL  CHIEF COMPLAINT:  Chief Complaint  Patient presents with  . Follow-up    RLS    HISTORY OF PRESENT ILLNESS:   UPDATE 07/16/13: Since last visit continues to have severe muscle spasms, cramps, urge to stand and move, with symptoms still mainly in her legs. Still with back pain. Some numbness in the feet. Patchy numbness in the arms as well. Severe peripheral edema in the legs. Not using her CPAP. On ropinirole 4mg  BID-TID + gabapentin 300mg  qhs.   UPDATE 11/03/12: Since last visit, having more leg pain, back pain, restless legs and "tremors". Having poor sleep. [place] down few months ago and has a "fracture" of left tibia. Sig bilateral knee pain.  UPDATE: 09/17/11. Due to a change in copay, pt is no longer on Requip XL because she cannot afford. Her copay would be around $300.She is taking a total of 5 regular 2mg  tabs with good results and control of her RLS.   UPDATE: 05/07/11. Pt does not feel Restless legs controlled at night with d/c regular ropinirole. Her symptoms are worse at night. She has a lot of drowsiness with Gabapentin and does not take it as directed. She also reports CBG in the 160 to 180 range last few months. See ROS  UPDATE 01/11/11: Still with RLS sxs.  Higher requip has not helped.  Mainly sxs at night.    PRIOR HPI (11/07/10): 63 year old right-handed female with history of diabetes, hypercholesteremia, breast cancer, lumbar spine surgery, here for evaluation of restless leg syndrome. For past 5 years the patient has intermittent episodes of muscle spasm, typically when she is falling asleep. Sometimes this occurs when she is sitting and watching TV.  In the past one year she has developed similar symptoms in her arms. She describes involuntary, muscle spasm and jerking movements lasting 20  seconds a time.  Her right leg seems to be more affected than the left leg. She also reports right knee pain, recent right knee replacement surgery in April 2012, and prior depression and anxiety which is now well-controlled. Patient has been treated with ropinirol, then increased to Requip XL, now on both (Requip XL 2mg  TID + ropinirole 2mg  qhs).   REVIEW OF SYSTEMS: Full 14 system review of systems performed and notable only for fatigue cough shortness of breath leg swelling RLS apnea snoring joint pain cramps walking diff weakness headache memory loss tremor.   ALLERGIES: Allergies  Allergen Reactions  . Codeine Itching    HOME MEDICATIONS: Outpatient Prescriptions Prior to Visit  Medication Sig Dispense Refill  . citalopram (CELEXA) 40 MG tablet Take 40 mg by mouth every morning.       . ferrous sulfate 325 (65 FE) MG tablet Take 1 tablet (325 mg total) by mouth 2 (two) times daily with a meal.  60 tablet  3  . glucose blood test strip       . HYDROcodone-acetaminophen (NORCO/VICODIN) 5-325 MG per tablet Take 1 tablet by mouth as needed.      [employer] Gu[employer] Devices (SIMPLE DIAGNOSTICS LANCING DEV) MISC       . LANCETS ULTRA THIN MISC       . levothyroxine (SYNTHROID, LEVOTHROID) 75 MCG tablet Take 75 mcg by mouth daily before breakfast.       . losartan (COZAAR) 50 MG tablet Take 1 tablet (  50 mg total) by mouth daily.  90 tablet  3  . Magnesium 400 MG CAPS Take 2 capsules by mouth daily.  60 capsule  3  . metFORMIN (GLUCOPHAGE-XR) 500 MG 24 hr tablet Take 1,000 mg by mouth at bedtime.       . nitroGLYCERIN (NITROSTAT) 0.4 MG SL tablet Place 0.4 mg under the tongue every 5 (five) minutes as needed for chest pain.      . tamoxifen (NOLVADEX) 20 MG tablet Take 20 mg by mouth at bedtime.       . torsemide (DEMADEX) 20 MG tablet Take 40 mg by mouth 2 (two) times daily. PT ADVISED SHE MAINLY TAKES 20-30MG  TWICE DAILY      . traMADol (ULTRAM) 50 MG tablet Take 1 tablet (50 mg total) by mouth every  6 (six) hours as needed.  120 tablet  0  . traZODone (DESYREL) 25 mg TABS tablet Take 0.5 tablets (25 mg total) by mouth at bedtime.  30 tablet  0  . gabapentin (NEURONTIN) 300 MG capsule Take 300 mg by mouth at bedtime.      Marland Kitchen rOPINIRole (REQUIP) 4 MG tablet Take 1 tablet (4 mg total) by mouth 3 (three) times daily.  90 tablet  12  . oxyCODONE-acetaminophen (PERCOCET/ROXICET) 5-325 MG per tablet        No facility-administered medications prior to visit.    PAST MEDICAL HISTORY: Past Medical History  Diagnosis Date  . Essential hypertension, benign   . Type 2 diabetes mellitus   . Breast cancer   . OSA (obstructive sleep apnea)   . RLS (restless legs syndrome)   . Hyperlipidemia     PAST SURGICAL HISTORY: Past Surgical History  Procedure Laterality Date  . Tonsillectomy  1958  . Tubal ligation  1974  . Cesarean section  1972  . Carpal tunnel release Left 1994  . Partial hysterectomy  2006  . Back surgery  2008  . Knee surgery Right   . Total knee arthroplasty Right 2008  . Breast lumpectomy  2012  . Cholecystectomy    . Carpal tunnel release Right 1975    FAMILY HISTORY: Family History  Problem Relation Age of Onset  . Asthma Father   . Heart disease Father   . Colon cancer Father   . Skin cancer Father   . Colon cancer Mother 17  . Breast cancer Mother 45  . Pneumonia Mother   . Colon cancer Brother   . Colon cancer Brother   . Hodgkin's lymphoma Son     SOCIAL HISTORY:  History   Social History  . Marital Status: Married    Spouse Name: Fuller Song    Number of Children: 2  . Years of Education: College   Occupational History  . Disabled     n/a   Social History Main Topics  . Smoking status: Never Smoker   . Smokeless tobacco: Never Used  . Alcohol Use: No  . Drug Use: No  . Sexual Activity: Not on file   Other Topics Concern  . Not on file   Social History Narrative   Patient lives at home with her spouse.   Caffeine Use: drinks tea  daily     PHYSICAL EXAM  Filed Vitals:   07/15/13 1315  BP: 136/63  Pulse: 69  Height: 5' 1.5" (1.562 m)  Weight: 266 lb (120.657 kg)    Not recorded    Body mass index is 49.45 kg/(m^2).  GENERAL EXAM: Patient  is in no distress; SIGNIFICANT PERIPHERAL EDEMA IN FEET AND LEGS  CARDIOVASCULAR: Regular rate and rhythm, no murmurs, no carotid bruits  NEUROLOGIC: MENTAL STATUS: awake, alert, language fluent, comprehension intact, naming intact CRANIAL NERVE: pupils equal and reactive to light, visual fields full to confrontation, extraocular muscles intact, no nystagmus, facial sensation and strength symmetric, uvula midline, shoulder shrug symmetric, tongue midline. MOTOR: normal bulk and tone, full strength in the BUE, BLE; INTERMITTENT TREMORS IN LEFT LEG WHEN PATIENT HOLDS IT OUT. SENSORY: normal and symmetric to light touch COORDINATION: finger-nose-finger, fine finger movements normal REFLEXES: deep tendon reflexes ABSENT IN BLE GAIT/STATION: ANTALGIC GAIT.   DIAGNOSTIC DATA (LABS, IMAGING, TESTING) - I reviewed patient records, labs, notes, testing and imaging myself where available.  Lab Results  Component Value Date   WBC 10.7* 10/01/2012   HGB 10.4* 10/01/2012   HCT 32.5* 10/01/2012   MCV 89.5 10/01/2012   PLT 222 10/01/2012      Component Value Date/Time   NA 139 10/05/2012 0525   K 3.6 10/05/2012 0525   CL 102 10/05/2012 0525   CO2 28 10/05/2012 0525   GLUCOSE 128* 10/05/2012 0525   BUN 28* 10/05/2012 0525   CREATININE 0.81 10/05/2012 0525   CREATININE 0.85 06/16/2012 1645   CALCIUM 9.2 10/05/2012 0525   PROT 6.7 09/30/2012 1934   ALBUMIN 3.5 09/30/2012 1934   AST 51* 09/30/2012 1934   ALT 30 09/30/2012 1934   ALKPHOS 75 09/30/2012 1934   BILITOT 0.3 09/30/2012 1934   GFRNONAA 76* 10/05/2012 0525   GFRAA 88* 10/05/2012 0525   No results found for this basename: CHOL,  HDL,  LDLCALC,  LDLDIRECT,  TRIG,  CHOLHDL   No results found for this basename: HGBA1C   Lab Results  Component  Value Date   VITAMINB12 462 10/01/2012   Lab Results  Component Value Date   TSH 3.091 10/01/2012    11/14/10 MRI cervical spine - disc bulging from C3-4 down to C6-7.  No spinal stenosis or foraminal narrowing.   11/14/10 MRI lumbar spine - At L2-3: Disc bulging and facet hypertrophy with moderate right and severe left foraminal stenosis. At L3-4: Disc bulging and facet hypertrophy with moderate biforaminal stenosis.  At L4-5: Disc bulging and facet hypertrophy moderate right and severe left foraminal stenosis.     ASSESSMENT AND PLAN  63 y.o. female with RLS, now with increasing muscle spasms and jerking movements.  Also with significant pain in low back and legs. Could represent painful legs moving toes syndrome, due to lumbar radiculopathy + diabetic neuropathy. Some superimposed RLS symptoms as well. Not much favorable response from ropinirole. Also with significant peripheral edema, which may be worsened by ropinirole and gabapentin.  Dx: lumbar polyradiculopathy + diabetic neuropathy + RLS + insomnia + sleep apnea + peripheral leg edema  PLAN: 1. Taper ropinirole down and gradually increase gabapentin over next 3-4 months 2. Refer to pain clinic for consideration of narcotic therapy for pain syndrome and RLS treatment  Return in about 6 months (around 01/14/2014).    Penni Bombard, MD 2/83/6629, 4:76 PM Certified in Neurology, Neurophysiology and Neuroimaging  [facility] [address], [place], [place] [address] [phone]

## 2013-07-15 NOTE — Patient Instructions (Signed)
Month 1: Reduce ropinirole to 4 mg at bedtime. Increase gabapentin to 300 mg twice a day.  Month 2: Reduce ropinirole to 2mg  at bedtime. Increase gabapentin to 300mg  three times per day.  Month 3: Reduce ropinirole to 1mg  at bedtime. Increase gabapentin to 300mg  in AM, 300mg  at noon and 600mg  at bedtime.  Month 4: Stop ropinirole.

## 2013-07-21 ENCOUNTER — Telehealth: Payer: Self-pay | Admitting: Diagnostic Neuroimaging

## 2013-07-21 NOTE — Telephone Encounter (Signed)
Spoke with patient and she said that she took the gabapentin_300mg   once early during the day and at bedtime and the next morning was so droggy, could not get out of bed, can't function,still so sleepy

## 2013-07-21 NOTE — Telephone Encounter (Signed)
Patient has questions regarding her Ropinirole and Gabapentin medications, please call patient to clarify.

## 2013-07-21 NOTE — Telephone Encounter (Signed)
Spoke to pt and went over her medication instructions again after consulting Dr. Leta Baptist.  I instructed that she is to take gabapentin 300mg  po bid (ex 0900-2100), TID dosing am, noon, pm.  Will have adjustment time relating to drowsiness.  Referral for pain management in place (Dr. Ace Gins).   Pt has severe peripheral edema.  Recommend seeing pcp to evaluate.  Care giver at home. She verbalized understanding.

## 2013-09-23 ENCOUNTER — Telehealth: Payer: Self-pay | Admitting: Neurology

## 2013-09-23 NOTE — Telephone Encounter (Signed)
Patient should follow instructions from last visit and follow up with pain mgmt. -VRP

## 2013-09-23 NOTE — Telephone Encounter (Signed)
Spoke to patient and she was crying trying to relay what was going on with her.  She said he legs and even her arms are so restless, "not quitting" and she is not sleeping.  There is no pattern to it.  She said the Gabapentin and Ropinerole are not helping and they are knocking her out and also making her hallucinate.  She stated she tried Requip XL in the past and it worked but was too expensive, and now she found out that there is a Ropinirole time released.  She wanted to know if the doctor would prescribe that.  I talked to her about coming in for an appointment, but she wanted to see if she could get the medicine first.  Please advise.

## 2013-09-24 NOTE — Telephone Encounter (Signed)
Pt calling back, since did not hear back from [place].  She is asking about another medication to try.   I relayed the message from Dr. Leta Baptist.  (re: following instructions from last visit and f/u with pain management).  She has not done this since was in hospital when they called.  I told her that he did not have anything else to offer and PM would be beneficial in evaluating and then making plan of care for longterm medication therapy.  She did not really want to do this.   She will contact her pcp.

## 2013-11-04 ENCOUNTER — Telehealth: Payer: Self-pay | Admitting: Nurse Practitioner

## 2013-11-04 ENCOUNTER — Ambulatory Visit: Payer: Medicare Other | Admitting: Nurse Practitioner

## 2013-11-04 NOTE — Telephone Encounter (Signed)
Patient was no show for today's office appointment.  

## 2014-01-13 ENCOUNTER — Other Ambulatory Visit: Payer: Self-pay | Admitting: Cardiology

## 2014-01-14 ENCOUNTER — Ambulatory Visit: Payer: Medicare Other | Admitting: Diagnostic Neuroimaging

## 2014-03-11 ENCOUNTER — Other Ambulatory Visit: Payer: Self-pay | Admitting: Cardiology

## 2014-06-24 DIAGNOSIS — I10 Essential (primary) hypertension: Secondary | ICD-10-CM | POA: Diagnosis not present

## 2014-06-24 DIAGNOSIS — Z96651 Presence of right artificial knee joint: Secondary | ICD-10-CM | POA: Diagnosis not present

## 2014-06-24 DIAGNOSIS — I890 Lymphedema, not elsewhere classified: Secondary | ICD-10-CM | POA: Diagnosis not present

## 2014-06-24 DIAGNOSIS — L97821 Non-pressure chronic ulcer of other part of left lower leg limited to breakdown of skin: Secondary | ICD-10-CM | POA: Diagnosis not present

## 2014-06-24 DIAGNOSIS — L03116 Cellulitis of left lower limb: Secondary | ICD-10-CM | POA: Diagnosis not present

## 2014-06-24 DIAGNOSIS — E1140 Type 2 diabetes mellitus with diabetic neuropathy, unspecified: Secondary | ICD-10-CM | POA: Diagnosis not present

## 2014-06-30 DIAGNOSIS — I10 Essential (primary) hypertension: Secondary | ICD-10-CM | POA: Diagnosis not present

## 2014-06-30 DIAGNOSIS — Z96651 Presence of right artificial knee joint: Secondary | ICD-10-CM | POA: Diagnosis not present

## 2014-06-30 DIAGNOSIS — I890 Lymphedema, not elsewhere classified: Secondary | ICD-10-CM | POA: Diagnosis not present

## 2014-06-30 DIAGNOSIS — L97821 Non-pressure chronic ulcer of other part of left lower leg limited to breakdown of skin: Secondary | ICD-10-CM | POA: Diagnosis not present

## 2014-06-30 DIAGNOSIS — E1140 Type 2 diabetes mellitus with diabetic neuropathy, unspecified: Secondary | ICD-10-CM | POA: Diagnosis not present

## 2014-06-30 DIAGNOSIS — L03116 Cellulitis of left lower limb: Secondary | ICD-10-CM | POA: Diagnosis not present

## 2014-07-07 DIAGNOSIS — L03116 Cellulitis of left lower limb: Secondary | ICD-10-CM | POA: Diagnosis not present

## 2014-07-07 DIAGNOSIS — L97821 Non-pressure chronic ulcer of other part of left lower leg limited to breakdown of skin: Secondary | ICD-10-CM | POA: Diagnosis not present

## 2014-07-07 DIAGNOSIS — Z96651 Presence of right artificial knee joint: Secondary | ICD-10-CM | POA: Diagnosis not present

## 2014-07-07 DIAGNOSIS — I10 Essential (primary) hypertension: Secondary | ICD-10-CM | POA: Diagnosis not present

## 2014-07-07 DIAGNOSIS — E1140 Type 2 diabetes mellitus with diabetic neuropathy, unspecified: Secondary | ICD-10-CM | POA: Diagnosis not present

## 2014-07-14 DIAGNOSIS — I10 Essential (primary) hypertension: Secondary | ICD-10-CM | POA: Diagnosis not present

## 2014-07-14 DIAGNOSIS — E1140 Type 2 diabetes mellitus with diabetic neuropathy, unspecified: Secondary | ICD-10-CM | POA: Diagnosis not present

## 2014-07-14 DIAGNOSIS — Z96651 Presence of right artificial knee joint: Secondary | ICD-10-CM | POA: Diagnosis not present

## 2014-07-14 DIAGNOSIS — L03116 Cellulitis of left lower limb: Secondary | ICD-10-CM | POA: Diagnosis not present

## 2014-07-14 DIAGNOSIS — L97821 Non-pressure chronic ulcer of other part of left lower leg limited to breakdown of skin: Secondary | ICD-10-CM | POA: Diagnosis not present

## 2014-07-26 ENCOUNTER — Other Ambulatory Visit: Payer: Self-pay

## 2014-07-27 DIAGNOSIS — E1140 Type 2 diabetes mellitus with diabetic neuropathy, unspecified: Secondary | ICD-10-CM | POA: Diagnosis not present

## 2014-07-27 DIAGNOSIS — Z96651 Presence of right artificial knee joint: Secondary | ICD-10-CM | POA: Diagnosis not present

## 2014-07-27 DIAGNOSIS — I10 Essential (primary) hypertension: Secondary | ICD-10-CM | POA: Diagnosis not present

## 2014-07-27 DIAGNOSIS — L97821 Non-pressure chronic ulcer of other part of left lower leg limited to breakdown of skin: Secondary | ICD-10-CM | POA: Diagnosis not present

## 2014-07-27 DIAGNOSIS — L03116 Cellulitis of left lower limb: Secondary | ICD-10-CM | POA: Diagnosis not present

## 2014-08-04 DIAGNOSIS — L03116 Cellulitis of left lower limb: Secondary | ICD-10-CM | POA: Diagnosis not present

## 2014-08-04 DIAGNOSIS — Z96651 Presence of right artificial knee joint: Secondary | ICD-10-CM | POA: Diagnosis not present

## 2014-08-04 DIAGNOSIS — I10 Essential (primary) hypertension: Secondary | ICD-10-CM | POA: Diagnosis not present

## 2014-08-04 DIAGNOSIS — E1140 Type 2 diabetes mellitus with diabetic neuropathy, unspecified: Secondary | ICD-10-CM | POA: Diagnosis not present

## 2014-08-04 DIAGNOSIS — L97821 Non-pressure chronic ulcer of other part of left lower leg limited to breakdown of skin: Secondary | ICD-10-CM | POA: Diagnosis not present

## 2014-08-06 DIAGNOSIS — E1165 Type 2 diabetes mellitus with hyperglycemia: Secondary | ICD-10-CM | POA: Diagnosis not present

## 2014-08-06 DIAGNOSIS — E039 Hypothyroidism, unspecified: Secondary | ICD-10-CM | POA: Diagnosis not present

## 2014-08-06 DIAGNOSIS — I10 Essential (primary) hypertension: Secondary | ICD-10-CM | POA: Diagnosis not present

## 2014-08-10 DIAGNOSIS — I10 Essential (primary) hypertension: Secondary | ICD-10-CM | POA: Diagnosis not present

## 2014-08-11 DIAGNOSIS — L97821 Non-pressure chronic ulcer of other part of left lower leg limited to breakdown of skin: Secondary | ICD-10-CM | POA: Diagnosis not present

## 2014-08-11 DIAGNOSIS — I10 Essential (primary) hypertension: Secondary | ICD-10-CM | POA: Diagnosis not present

## 2014-08-11 DIAGNOSIS — Z96651 Presence of right artificial knee joint: Secondary | ICD-10-CM | POA: Diagnosis not present

## 2014-08-11 DIAGNOSIS — E1140 Type 2 diabetes mellitus with diabetic neuropathy, unspecified: Secondary | ICD-10-CM | POA: Diagnosis not present

## 2014-08-11 DIAGNOSIS — L03116 Cellulitis of left lower limb: Secondary | ICD-10-CM | POA: Diagnosis not present

## 2014-08-18 DIAGNOSIS — R51 Headache: Secondary | ICD-10-CM | POA: Diagnosis not present

## 2014-08-18 DIAGNOSIS — E1165 Type 2 diabetes mellitus with hyperglycemia: Secondary | ICD-10-CM | POA: Diagnosis not present

## 2014-08-18 DIAGNOSIS — G2581 Restless legs syndrome: Secondary | ICD-10-CM | POA: Diagnosis not present

## 2014-08-18 DIAGNOSIS — R296 Repeated falls: Secondary | ICD-10-CM | POA: Diagnosis not present

## 2014-08-18 DIAGNOSIS — E039 Hypothyroidism, unspecified: Secondary | ICD-10-CM | POA: Diagnosis not present

## 2014-08-18 DIAGNOSIS — I10 Essential (primary) hypertension: Secondary | ICD-10-CM | POA: Diagnosis not present

## 2014-08-19 DIAGNOSIS — I10 Essential (primary) hypertension: Secondary | ICD-10-CM | POA: Diagnosis not present

## 2014-08-19 DIAGNOSIS — B965 Pseudomonas (aeruginosa) (mallei) (pseudomallei) as the cause of diseases classified elsewhere: Secondary | ICD-10-CM | POA: Diagnosis not present

## 2014-08-19 DIAGNOSIS — Z96651 Presence of right artificial knee joint: Secondary | ICD-10-CM | POA: Diagnosis not present

## 2014-08-19 DIAGNOSIS — L97821 Non-pressure chronic ulcer of other part of left lower leg limited to breakdown of skin: Secondary | ICD-10-CM | POA: Diagnosis not present

## 2014-08-19 DIAGNOSIS — E1140 Type 2 diabetes mellitus with diabetic neuropathy, unspecified: Secondary | ICD-10-CM | POA: Diagnosis not present

## 2014-08-19 DIAGNOSIS — L03116 Cellulitis of left lower limb: Secondary | ICD-10-CM | POA: Diagnosis not present

## 2014-08-24 DIAGNOSIS — B965 Pseudomonas (aeruginosa) (mallei) (pseudomallei) as the cause of diseases classified elsewhere: Secondary | ICD-10-CM | POA: Diagnosis not present

## 2014-08-24 DIAGNOSIS — E1140 Type 2 diabetes mellitus with diabetic neuropathy, unspecified: Secondary | ICD-10-CM | POA: Diagnosis not present

## 2014-08-24 DIAGNOSIS — L03116 Cellulitis of left lower limb: Secondary | ICD-10-CM | POA: Diagnosis not present

## 2014-08-24 DIAGNOSIS — I10 Essential (primary) hypertension: Secondary | ICD-10-CM | POA: Diagnosis not present

## 2014-08-24 DIAGNOSIS — Z96651 Presence of right artificial knee joint: Secondary | ICD-10-CM | POA: Diagnosis not present

## 2014-08-24 DIAGNOSIS — L97821 Non-pressure chronic ulcer of other part of left lower leg limited to breakdown of skin: Secondary | ICD-10-CM | POA: Diagnosis not present

## 2014-08-31 DIAGNOSIS — L03115 Cellulitis of right lower limb: Secondary | ICD-10-CM | POA: Diagnosis not present

## 2014-08-31 DIAGNOSIS — L97821 Non-pressure chronic ulcer of other part of left lower leg limited to breakdown of skin: Secondary | ICD-10-CM | POA: Diagnosis not present

## 2014-08-31 DIAGNOSIS — Z96651 Presence of right artificial knee joint: Secondary | ICD-10-CM | POA: Diagnosis not present

## 2014-08-31 DIAGNOSIS — L03116 Cellulitis of left lower limb: Secondary | ICD-10-CM | POA: Diagnosis not present

## 2014-08-31 DIAGNOSIS — E1140 Type 2 diabetes mellitus with diabetic neuropathy, unspecified: Secondary | ICD-10-CM | POA: Diagnosis not present

## 2014-08-31 DIAGNOSIS — B965 Pseudomonas (aeruginosa) (mallei) (pseudomallei) as the cause of diseases classified elsewhere: Secondary | ICD-10-CM | POA: Diagnosis not present

## 2014-08-31 DIAGNOSIS — I10 Essential (primary) hypertension: Secondary | ICD-10-CM | POA: Diagnosis not present

## 2014-09-14 DIAGNOSIS — L97821 Non-pressure chronic ulcer of other part of left lower leg limited to breakdown of skin: Secondary | ICD-10-CM | POA: Diagnosis not present

## 2014-09-14 DIAGNOSIS — L03116 Cellulitis of left lower limb: Secondary | ICD-10-CM | POA: Diagnosis not present

## 2014-09-14 DIAGNOSIS — Z96651 Presence of right artificial knee joint: Secondary | ICD-10-CM | POA: Diagnosis not present

## 2014-09-14 DIAGNOSIS — N179 Acute kidney failure, unspecified: Secondary | ICD-10-CM | POA: Diagnosis not present

## 2014-09-14 DIAGNOSIS — L03115 Cellulitis of right lower limb: Secondary | ICD-10-CM | POA: Diagnosis not present

## 2014-09-14 DIAGNOSIS — B965 Pseudomonas (aeruginosa) (mallei) (pseudomallei) as the cause of diseases classified elsewhere: Secondary | ICD-10-CM | POA: Diagnosis not present

## 2014-09-14 DIAGNOSIS — G4730 Sleep apnea, unspecified: Secondary | ICD-10-CM | POA: Diagnosis not present

## 2014-09-14 DIAGNOSIS — I10 Essential (primary) hypertension: Secondary | ICD-10-CM | POA: Diagnosis not present

## 2014-09-14 DIAGNOSIS — D649 Anemia, unspecified: Secondary | ICD-10-CM | POA: Diagnosis not present

## 2014-09-14 DIAGNOSIS — E1140 Type 2 diabetes mellitus with diabetic neuropathy, unspecified: Secondary | ICD-10-CM | POA: Diagnosis not present

## 2014-09-21 DIAGNOSIS — L03116 Cellulitis of left lower limb: Secondary | ICD-10-CM | POA: Diagnosis not present

## 2014-09-21 DIAGNOSIS — L97821 Non-pressure chronic ulcer of other part of left lower leg limited to breakdown of skin: Secondary | ICD-10-CM | POA: Diagnosis not present

## 2014-09-21 DIAGNOSIS — B965 Pseudomonas (aeruginosa) (mallei) (pseudomallei) as the cause of diseases classified elsewhere: Secondary | ICD-10-CM | POA: Diagnosis not present

## 2014-09-21 DIAGNOSIS — E1140 Type 2 diabetes mellitus with diabetic neuropathy, unspecified: Secondary | ICD-10-CM | POA: Diagnosis not present

## 2014-09-21 DIAGNOSIS — I10 Essential (primary) hypertension: Secondary | ICD-10-CM | POA: Diagnosis not present

## 2014-09-21 DIAGNOSIS — L03115 Cellulitis of right lower limb: Secondary | ICD-10-CM | POA: Diagnosis not present

## 2014-09-21 DIAGNOSIS — Z96651 Presence of right artificial knee joint: Secondary | ICD-10-CM | POA: Diagnosis not present

## 2014-09-28 DIAGNOSIS — E1140 Type 2 diabetes mellitus with diabetic neuropathy, unspecified: Secondary | ICD-10-CM | POA: Diagnosis not present

## 2014-09-28 DIAGNOSIS — D1730 Benign lipomatous neoplasm of skin and subcutaneous tissue of unspecified sites: Secondary | ICD-10-CM | POA: Diagnosis not present

## 2014-09-28 DIAGNOSIS — L97821 Non-pressure chronic ulcer of other part of left lower leg limited to breakdown of skin: Secondary | ICD-10-CM | POA: Diagnosis not present

## 2014-09-28 DIAGNOSIS — I10 Essential (primary) hypertension: Secondary | ICD-10-CM | POA: Diagnosis not present

## 2014-09-28 DIAGNOSIS — Z96651 Presence of right artificial knee joint: Secondary | ICD-10-CM | POA: Diagnosis not present

## 2014-09-28 DIAGNOSIS — R609 Edema, unspecified: Secondary | ICD-10-CM | POA: Diagnosis not present

## 2014-10-05 DIAGNOSIS — L97521 Non-pressure chronic ulcer of other part of left foot limited to breakdown of skin: Secondary | ICD-10-CM | POA: Diagnosis not present

## 2014-10-05 DIAGNOSIS — Z96651 Presence of right artificial knee joint: Secondary | ICD-10-CM | POA: Diagnosis not present

## 2014-10-05 DIAGNOSIS — R609 Edema, unspecified: Secondary | ICD-10-CM | POA: Diagnosis not present

## 2014-10-05 DIAGNOSIS — E1140 Type 2 diabetes mellitus with diabetic neuropathy, unspecified: Secondary | ICD-10-CM | POA: Diagnosis not present

## 2014-10-05 DIAGNOSIS — I10 Essential (primary) hypertension: Secondary | ICD-10-CM | POA: Diagnosis not present

## 2014-10-06 DIAGNOSIS — M1712 Unilateral primary osteoarthritis, left knee: Secondary | ICD-10-CM | POA: Diagnosis not present

## 2014-10-13 DIAGNOSIS — I10 Essential (primary) hypertension: Secondary | ICD-10-CM | POA: Diagnosis not present

## 2014-10-13 DIAGNOSIS — E1140 Type 2 diabetes mellitus with diabetic neuropathy, unspecified: Secondary | ICD-10-CM | POA: Diagnosis not present

## 2014-10-13 DIAGNOSIS — R609 Edema, unspecified: Secondary | ICD-10-CM | POA: Diagnosis not present

## 2014-10-13 DIAGNOSIS — Z96651 Presence of right artificial knee joint: Secondary | ICD-10-CM | POA: Diagnosis not present

## 2014-10-13 DIAGNOSIS — L97521 Non-pressure chronic ulcer of other part of left foot limited to breakdown of skin: Secondary | ICD-10-CM | POA: Diagnosis not present

## 2014-10-14 DIAGNOSIS — M6240 Contracture of muscle, unspecified site: Secondary | ICD-10-CM | POA: Diagnosis not present

## 2014-10-21 DIAGNOSIS — L97521 Non-pressure chronic ulcer of other part of left foot limited to breakdown of skin: Secondary | ICD-10-CM | POA: Diagnosis not present

## 2014-10-21 DIAGNOSIS — Z96651 Presence of right artificial knee joint: Secondary | ICD-10-CM | POA: Diagnosis not present

## 2014-10-21 DIAGNOSIS — R609 Edema, unspecified: Secondary | ICD-10-CM | POA: Diagnosis not present

## 2014-10-21 DIAGNOSIS — E1140 Type 2 diabetes mellitus with diabetic neuropathy, unspecified: Secondary | ICD-10-CM | POA: Diagnosis not present

## 2014-10-21 DIAGNOSIS — I10 Essential (primary) hypertension: Secondary | ICD-10-CM | POA: Diagnosis not present

## 2014-10-27 DIAGNOSIS — M25562 Pain in left knee: Secondary | ICD-10-CM | POA: Diagnosis not present

## 2014-10-27 DIAGNOSIS — M1712 Unilateral primary osteoarthritis, left knee: Secondary | ICD-10-CM | POA: Diagnosis not present

## 2014-10-28 DIAGNOSIS — L97521 Non-pressure chronic ulcer of other part of left foot limited to breakdown of skin: Secondary | ICD-10-CM | POA: Diagnosis not present

## 2014-10-28 DIAGNOSIS — E1140 Type 2 diabetes mellitus with diabetic neuropathy, unspecified: Secondary | ICD-10-CM | POA: Diagnosis not present

## 2014-10-28 DIAGNOSIS — Z96651 Presence of right artificial knee joint: Secondary | ICD-10-CM | POA: Diagnosis not present

## 2014-10-28 DIAGNOSIS — I10 Essential (primary) hypertension: Secondary | ICD-10-CM | POA: Diagnosis not present

## 2014-10-28 DIAGNOSIS — R609 Edema, unspecified: Secondary | ICD-10-CM | POA: Diagnosis not present

## 2014-11-04 DIAGNOSIS — I10 Essential (primary) hypertension: Secondary | ICD-10-CM | POA: Diagnosis not present

## 2014-11-04 DIAGNOSIS — E1140 Type 2 diabetes mellitus with diabetic neuropathy, unspecified: Secondary | ICD-10-CM | POA: Diagnosis not present

## 2014-11-04 DIAGNOSIS — L03116 Cellulitis of left lower limb: Secondary | ICD-10-CM | POA: Diagnosis not present

## 2014-11-04 DIAGNOSIS — Z96651 Presence of right artificial knee joint: Secondary | ICD-10-CM | POA: Diagnosis not present

## 2014-11-04 DIAGNOSIS — L97521 Non-pressure chronic ulcer of other part of left foot limited to breakdown of skin: Secondary | ICD-10-CM | POA: Diagnosis not present

## 2014-11-04 DIAGNOSIS — R609 Edema, unspecified: Secondary | ICD-10-CM | POA: Diagnosis not present

## 2014-11-09 DIAGNOSIS — Z7984 Long term (current) use of oral hypoglycemic drugs: Secondary | ICD-10-CM | POA: Diagnosis not present

## 2014-11-09 DIAGNOSIS — E1140 Type 2 diabetes mellitus with diabetic neuropathy, unspecified: Secondary | ICD-10-CM | POA: Diagnosis not present

## 2014-11-09 DIAGNOSIS — L03116 Cellulitis of left lower limb: Secondary | ICD-10-CM | POA: Diagnosis not present

## 2014-11-09 DIAGNOSIS — Z96651 Presence of right artificial knee joint: Secondary | ICD-10-CM | POA: Diagnosis not present

## 2014-11-09 DIAGNOSIS — I10 Essential (primary) hypertension: Secondary | ICD-10-CM | POA: Diagnosis not present

## 2014-11-09 DIAGNOSIS — L97521 Non-pressure chronic ulcer of other part of left foot limited to breakdown of skin: Secondary | ICD-10-CM | POA: Diagnosis not present

## 2014-11-16 DIAGNOSIS — Z7984 Long term (current) use of oral hypoglycemic drugs: Secondary | ICD-10-CM | POA: Diagnosis not present

## 2014-11-16 DIAGNOSIS — L97521 Non-pressure chronic ulcer of other part of left foot limited to breakdown of skin: Secondary | ICD-10-CM | POA: Diagnosis not present

## 2014-11-16 DIAGNOSIS — I10 Essential (primary) hypertension: Secondary | ICD-10-CM | POA: Diagnosis not present

## 2014-11-16 DIAGNOSIS — E1140 Type 2 diabetes mellitus with diabetic neuropathy, unspecified: Secondary | ICD-10-CM | POA: Diagnosis not present

## 2014-11-16 DIAGNOSIS — Z96651 Presence of right artificial knee joint: Secondary | ICD-10-CM | POA: Diagnosis not present

## 2014-11-16 DIAGNOSIS — L03116 Cellulitis of left lower limb: Secondary | ICD-10-CM | POA: Diagnosis not present

## 2014-11-23 DIAGNOSIS — E1140 Type 2 diabetes mellitus with diabetic neuropathy, unspecified: Secondary | ICD-10-CM | POA: Diagnosis not present

## 2014-11-23 DIAGNOSIS — M62838 Other muscle spasm: Secondary | ICD-10-CM | POA: Diagnosis not present

## 2014-11-24 DIAGNOSIS — I10 Essential (primary) hypertension: Secondary | ICD-10-CM | POA: Diagnosis not present

## 2014-11-24 DIAGNOSIS — Z7984 Long term (current) use of oral hypoglycemic drugs: Secondary | ICD-10-CM | POA: Diagnosis not present

## 2014-11-24 DIAGNOSIS — E1140 Type 2 diabetes mellitus with diabetic neuropathy, unspecified: Secondary | ICD-10-CM | POA: Diagnosis not present

## 2014-11-24 DIAGNOSIS — Z96651 Presence of right artificial knee joint: Secondary | ICD-10-CM | POA: Diagnosis not present

## 2014-11-24 DIAGNOSIS — L97521 Non-pressure chronic ulcer of other part of left foot limited to breakdown of skin: Secondary | ICD-10-CM | POA: Diagnosis not present

## 2014-11-24 DIAGNOSIS — L03115 Cellulitis of right lower limb: Secondary | ICD-10-CM | POA: Diagnosis not present

## 2014-11-30 DIAGNOSIS — E1140 Type 2 diabetes mellitus with diabetic neuropathy, unspecified: Secondary | ICD-10-CM | POA: Diagnosis not present

## 2014-11-30 DIAGNOSIS — L97521 Non-pressure chronic ulcer of other part of left foot limited to breakdown of skin: Secondary | ICD-10-CM | POA: Diagnosis not present

## 2014-11-30 DIAGNOSIS — I10 Essential (primary) hypertension: Secondary | ICD-10-CM | POA: Diagnosis not present

## 2014-11-30 DIAGNOSIS — Z96651 Presence of right artificial knee joint: Secondary | ICD-10-CM | POA: Diagnosis not present

## 2014-11-30 DIAGNOSIS — B965 Pseudomonas (aeruginosa) (mallei) (pseudomallei) as the cause of diseases classified elsewhere: Secondary | ICD-10-CM | POA: Diagnosis not present

## 2014-11-30 DIAGNOSIS — L03115 Cellulitis of right lower limb: Secondary | ICD-10-CM | POA: Diagnosis not present

## 2014-11-30 DIAGNOSIS — Z7984 Long term (current) use of oral hypoglycemic drugs: Secondary | ICD-10-CM | POA: Diagnosis not present

## 2014-12-09 DIAGNOSIS — I10 Essential (primary) hypertension: Secondary | ICD-10-CM | POA: Diagnosis not present

## 2014-12-09 DIAGNOSIS — Z96651 Presence of right artificial knee joint: Secondary | ICD-10-CM | POA: Diagnosis not present

## 2014-12-09 DIAGNOSIS — Z7984 Long term (current) use of oral hypoglycemic drugs: Secondary | ICD-10-CM | POA: Diagnosis not present

## 2014-12-09 DIAGNOSIS — L03115 Cellulitis of right lower limb: Secondary | ICD-10-CM | POA: Diagnosis not present

## 2014-12-09 DIAGNOSIS — L97521 Non-pressure chronic ulcer of other part of left foot limited to breakdown of skin: Secondary | ICD-10-CM | POA: Diagnosis not present

## 2014-12-09 DIAGNOSIS — E1140 Type 2 diabetes mellitus with diabetic neuropathy, unspecified: Secondary | ICD-10-CM | POA: Diagnosis not present

## 2014-12-09 DIAGNOSIS — B965 Pseudomonas (aeruginosa) (mallei) (pseudomallei) as the cause of diseases classified elsewhere: Secondary | ICD-10-CM | POA: Diagnosis not present

## 2014-12-16 DIAGNOSIS — R600 Localized edema: Secondary | ICD-10-CM | POA: Diagnosis not present

## 2014-12-16 DIAGNOSIS — I10 Essential (primary) hypertension: Secondary | ICD-10-CM | POA: Diagnosis not present

## 2014-12-16 DIAGNOSIS — E1140 Type 2 diabetes mellitus with diabetic neuropathy, unspecified: Secondary | ICD-10-CM | POA: Diagnosis not present

## 2014-12-16 DIAGNOSIS — Z96651 Presence of right artificial knee joint: Secondary | ICD-10-CM | POA: Diagnosis not present

## 2014-12-16 DIAGNOSIS — L03116 Cellulitis of left lower limb: Secondary | ICD-10-CM | POA: Diagnosis not present

## 2014-12-16 DIAGNOSIS — Z7984 Long term (current) use of oral hypoglycemic drugs: Secondary | ICD-10-CM | POA: Diagnosis not present

## 2014-12-16 DIAGNOSIS — L97521 Non-pressure chronic ulcer of other part of left foot limited to breakdown of skin: Secondary | ICD-10-CM | POA: Diagnosis not present

## 2014-12-16 DIAGNOSIS — B965 Pseudomonas (aeruginosa) (mallei) (pseudomallei) as the cause of diseases classified elsewhere: Secondary | ICD-10-CM | POA: Diagnosis not present

## 2014-12-24 DIAGNOSIS — Z853 Personal history of malignant neoplasm of breast: Secondary | ICD-10-CM | POA: Diagnosis not present

## 2014-12-24 DIAGNOSIS — M25511 Pain in right shoulder: Secondary | ICD-10-CM | POA: Diagnosis not present

## 2014-12-24 DIAGNOSIS — E039 Hypothyroidism, unspecified: Secondary | ICD-10-CM | POA: Diagnosis not present

## 2014-12-24 DIAGNOSIS — Z96651 Presence of right artificial knee joint: Secondary | ICD-10-CM | POA: Diagnosis not present

## 2014-12-24 DIAGNOSIS — M25461 Effusion, right knee: Secondary | ICD-10-CM | POA: Diagnosis not present

## 2014-12-24 DIAGNOSIS — E119 Type 2 diabetes mellitus without complications: Secondary | ICD-10-CM | POA: Diagnosis not present

## 2014-12-24 DIAGNOSIS — Z9181 History of falling: Secondary | ICD-10-CM | POA: Diagnosis not present

## 2014-12-24 DIAGNOSIS — W19XXXA Unspecified fall, initial encounter: Secondary | ICD-10-CM | POA: Diagnosis not present

## 2014-12-24 DIAGNOSIS — M25551 Pain in right hip: Secondary | ICD-10-CM | POA: Diagnosis not present

## 2014-12-24 DIAGNOSIS — Z7982 Long term (current) use of aspirin: Secondary | ICD-10-CM | POA: Diagnosis not present

## 2014-12-24 DIAGNOSIS — M25562 Pain in left knee: Secondary | ICD-10-CM | POA: Diagnosis not present

## 2014-12-24 DIAGNOSIS — M1990 Unspecified osteoarthritis, unspecified site: Secondary | ICD-10-CM | POA: Diagnosis not present

## 2014-12-24 DIAGNOSIS — I110 Hypertensive heart disease with heart failure: Secondary | ICD-10-CM | POA: Diagnosis not present

## 2014-12-24 DIAGNOSIS — G8929 Other chronic pain: Secondary | ICD-10-CM | POA: Diagnosis not present

## 2014-12-24 DIAGNOSIS — I509 Heart failure, unspecified: Secondary | ICD-10-CM | POA: Diagnosis not present

## 2014-12-24 DIAGNOSIS — Z79899 Other long term (current) drug therapy: Secondary | ICD-10-CM | POA: Diagnosis not present

## 2014-12-24 DIAGNOSIS — G2581 Restless legs syndrome: Secondary | ICD-10-CM | POA: Diagnosis not present

## 2014-12-29 DIAGNOSIS — Z7984 Long term (current) use of oral hypoglycemic drugs: Secondary | ICD-10-CM | POA: Diagnosis not present

## 2014-12-29 DIAGNOSIS — E1140 Type 2 diabetes mellitus with diabetic neuropathy, unspecified: Secondary | ICD-10-CM | POA: Diagnosis not present

## 2014-12-29 DIAGNOSIS — M19071 Primary osteoarthritis, right ankle and foot: Secondary | ICD-10-CM | POA: Diagnosis not present

## 2014-12-29 DIAGNOSIS — M7731 Calcaneal spur, right foot: Secondary | ICD-10-CM | POA: Diagnosis not present

## 2014-12-29 DIAGNOSIS — I10 Essential (primary) hypertension: Secondary | ICD-10-CM | POA: Diagnosis not present

## 2014-12-29 DIAGNOSIS — Z9181 History of falling: Secondary | ICD-10-CM | POA: Diagnosis not present

## 2014-12-29 DIAGNOSIS — R600 Localized edema: Secondary | ICD-10-CM | POA: Diagnosis not present

## 2014-12-29 DIAGNOSIS — M25571 Pain in right ankle and joints of right foot: Secondary | ICD-10-CM | POA: Diagnosis not present

## 2014-12-29 DIAGNOSIS — Z96651 Presence of right artificial knee joint: Secondary | ICD-10-CM | POA: Diagnosis not present

## 2014-12-29 DIAGNOSIS — L97521 Non-pressure chronic ulcer of other part of left foot limited to breakdown of skin: Secondary | ICD-10-CM | POA: Diagnosis not present

## 2015-01-03 DIAGNOSIS — M5416 Radiculopathy, lumbar region: Secondary | ICD-10-CM | POA: Diagnosis not present

## 2015-01-03 DIAGNOSIS — M5136 Other intervertebral disc degeneration, lumbar region: Secondary | ICD-10-CM | POA: Diagnosis not present

## 2015-01-03 DIAGNOSIS — M4806 Spinal stenosis, lumbar region: Secondary | ICD-10-CM | POA: Diagnosis not present

## 2015-01-06 DIAGNOSIS — I10 Essential (primary) hypertension: Secondary | ICD-10-CM | POA: Diagnosis not present

## 2015-01-06 DIAGNOSIS — E1140 Type 2 diabetes mellitus with diabetic neuropathy, unspecified: Secondary | ICD-10-CM | POA: Diagnosis not present

## 2015-01-06 DIAGNOSIS — L97521 Non-pressure chronic ulcer of other part of left foot limited to breakdown of skin: Secondary | ICD-10-CM | POA: Diagnosis not present

## 2015-01-06 DIAGNOSIS — E876 Hypokalemia: Secondary | ICD-10-CM | POA: Diagnosis not present

## 2015-01-06 DIAGNOSIS — Z96651 Presence of right artificial knee joint: Secondary | ICD-10-CM | POA: Diagnosis not present

## 2015-01-06 DIAGNOSIS — Z7984 Long term (current) use of oral hypoglycemic drugs: Secondary | ICD-10-CM | POA: Diagnosis not present

## 2015-01-12 DIAGNOSIS — I10 Essential (primary) hypertension: Secondary | ICD-10-CM | POA: Diagnosis not present

## 2015-01-12 DIAGNOSIS — Z7984 Long term (current) use of oral hypoglycemic drugs: Secondary | ICD-10-CM | POA: Diagnosis not present

## 2015-01-12 DIAGNOSIS — Z96651 Presence of right artificial knee joint: Secondary | ICD-10-CM | POA: Diagnosis not present

## 2015-01-12 DIAGNOSIS — L97521 Non-pressure chronic ulcer of other part of left foot limited to breakdown of skin: Secondary | ICD-10-CM | POA: Diagnosis not present

## 2015-01-12 DIAGNOSIS — E1140 Type 2 diabetes mellitus with diabetic neuropathy, unspecified: Secondary | ICD-10-CM | POA: Diagnosis not present

## 2015-01-19 DIAGNOSIS — E1140 Type 2 diabetes mellitus with diabetic neuropathy, unspecified: Secondary | ICD-10-CM | POA: Diagnosis not present

## 2015-01-19 DIAGNOSIS — Z7984 Long term (current) use of oral hypoglycemic drugs: Secondary | ICD-10-CM | POA: Diagnosis not present

## 2015-01-19 DIAGNOSIS — Z96651 Presence of right artificial knee joint: Secondary | ICD-10-CM | POA: Diagnosis not present

## 2015-01-19 DIAGNOSIS — I10 Essential (primary) hypertension: Secondary | ICD-10-CM | POA: Diagnosis not present

## 2015-01-19 DIAGNOSIS — L97921 Non-pressure chronic ulcer of unspecified part of left lower leg limited to breakdown of skin: Secondary | ICD-10-CM | POA: Diagnosis not present

## 2015-01-21 DIAGNOSIS — L03116 Cellulitis of left lower limb: Secondary | ICD-10-CM | POA: Diagnosis not present

## 2015-01-26 DIAGNOSIS — Z96651 Presence of right artificial knee joint: Secondary | ICD-10-CM | POA: Diagnosis not present

## 2015-01-26 DIAGNOSIS — Z7984 Long term (current) use of oral hypoglycemic drugs: Secondary | ICD-10-CM | POA: Diagnosis not present

## 2015-01-26 DIAGNOSIS — I10 Essential (primary) hypertension: Secondary | ICD-10-CM | POA: Diagnosis not present

## 2015-01-26 DIAGNOSIS — L97921 Non-pressure chronic ulcer of unspecified part of left lower leg limited to breakdown of skin: Secondary | ICD-10-CM | POA: Diagnosis not present

## 2015-01-26 DIAGNOSIS — E1140 Type 2 diabetes mellitus with diabetic neuropathy, unspecified: Secondary | ICD-10-CM | POA: Diagnosis not present

## 2015-01-27 DIAGNOSIS — L03116 Cellulitis of left lower limb: Secondary | ICD-10-CM | POA: Diagnosis not present

## 2015-01-27 DIAGNOSIS — I5031 Acute diastolic (congestive) heart failure: Secondary | ICD-10-CM | POA: Diagnosis not present

## 2015-01-27 DIAGNOSIS — B964 Proteus (mirabilis) (morganii) as the cause of diseases classified elsewhere: Secondary | ICD-10-CM | POA: Diagnosis not present

## 2015-01-27 DIAGNOSIS — R509 Fever, unspecified: Secondary | ICD-10-CM | POA: Diagnosis not present

## 2015-01-27 DIAGNOSIS — F332 Major depressive disorder, recurrent severe without psychotic features: Secondary | ICD-10-CM | POA: Diagnosis not present

## 2015-01-27 DIAGNOSIS — L97821 Non-pressure chronic ulcer of other part of left lower leg limited to breakdown of skin: Secondary | ICD-10-CM | POA: Diagnosis not present

## 2015-01-27 DIAGNOSIS — L03115 Cellulitis of right lower limb: Secondary | ICD-10-CM | POA: Diagnosis not present

## 2015-01-27 DIAGNOSIS — Z6841 Body Mass Index (BMI) 40.0 and over, adult: Secondary | ICD-10-CM | POA: Diagnosis not present

## 2015-01-28 DIAGNOSIS — B965 Pseudomonas (aeruginosa) (mallei) (pseudomallei) as the cause of diseases classified elsewhere: Secondary | ICD-10-CM | POA: Diagnosis present

## 2015-01-28 DIAGNOSIS — Z79899 Other long term (current) drug therapy: Secondary | ICD-10-CM | POA: Diagnosis not present

## 2015-01-28 DIAGNOSIS — I517 Cardiomegaly: Secondary | ICD-10-CM | POA: Diagnosis not present

## 2015-01-28 DIAGNOSIS — I83013 Varicose veins of right lower extremity with ulcer of ankle: Secondary | ICD-10-CM | POA: Diagnosis not present

## 2015-01-28 DIAGNOSIS — B964 Proteus (mirabilis) (morganii) as the cause of diseases classified elsewhere: Secondary | ICD-10-CM | POA: Diagnosis present

## 2015-01-28 DIAGNOSIS — Z23 Encounter for immunization: Secondary | ICD-10-CM | POA: Diagnosis not present

## 2015-01-28 DIAGNOSIS — Z885 Allergy status to narcotic agent status: Secondary | ICD-10-CM | POA: Diagnosis not present

## 2015-01-28 DIAGNOSIS — G4733 Obstructive sleep apnea (adult) (pediatric): Secondary | ICD-10-CM | POA: Diagnosis present

## 2015-01-28 DIAGNOSIS — I872 Venous insufficiency (chronic) (peripheral): Secondary | ICD-10-CM | POA: Diagnosis present

## 2015-01-28 DIAGNOSIS — Z96652 Presence of left artificial knee joint: Secondary | ICD-10-CM | POA: Diagnosis present

## 2015-01-28 DIAGNOSIS — E1165 Type 2 diabetes mellitus with hyperglycemia: Secondary | ICD-10-CM | POA: Diagnosis present

## 2015-01-28 DIAGNOSIS — Z881 Allergy status to other antibiotic agents status: Secondary | ICD-10-CM | POA: Diagnosis not present

## 2015-01-28 DIAGNOSIS — Z79891 Long term (current) use of opiate analgesic: Secondary | ICD-10-CM | POA: Diagnosis not present

## 2015-01-28 DIAGNOSIS — F332 Major depressive disorder, recurrent severe without psychotic features: Secondary | ICD-10-CM | POA: Diagnosis present

## 2015-01-28 DIAGNOSIS — L03116 Cellulitis of left lower limb: Secondary | ICD-10-CM | POA: Diagnosis not present

## 2015-01-28 DIAGNOSIS — Z800 Family history of malignant neoplasm of digestive organs: Secondary | ICD-10-CM | POA: Diagnosis not present

## 2015-01-28 DIAGNOSIS — I110 Hypertensive heart disease with heart failure: Secondary | ICD-10-CM | POA: Diagnosis present

## 2015-01-28 DIAGNOSIS — Z7982 Long term (current) use of aspirin: Secondary | ICD-10-CM | POA: Diagnosis not present

## 2015-01-28 DIAGNOSIS — L97821 Non-pressure chronic ulcer of other part of left lower leg limited to breakdown of skin: Secondary | ICD-10-CM | POA: Diagnosis not present

## 2015-01-28 DIAGNOSIS — Z7984 Long term (current) use of oral hypoglycemic drugs: Secondary | ICD-10-CM | POA: Diagnosis not present

## 2015-01-28 DIAGNOSIS — Z90710 Acquired absence of both cervix and uterus: Secondary | ICD-10-CM | POA: Diagnosis not present

## 2015-01-28 DIAGNOSIS — E039 Hypothyroidism, unspecified: Secondary | ICD-10-CM | POA: Diagnosis present

## 2015-01-28 DIAGNOSIS — I519 Heart disease, unspecified: Secondary | ICD-10-CM | POA: Diagnosis not present

## 2015-01-28 DIAGNOSIS — I5031 Acute diastolic (congestive) heart failure: Secondary | ICD-10-CM | POA: Diagnosis not present

## 2015-01-28 DIAGNOSIS — Z6841 Body Mass Index (BMI) 40.0 and over, adult: Secondary | ICD-10-CM | POA: Diagnosis not present

## 2015-01-28 DIAGNOSIS — Z803 Family history of malignant neoplasm of breast: Secondary | ICD-10-CM | POA: Diagnosis not present

## 2015-01-28 DIAGNOSIS — B9689 Other specified bacterial agents as the cause of diseases classified elsewhere: Secondary | ICD-10-CM | POA: Diagnosis present

## 2015-02-02 DIAGNOSIS — R69 Illness, unspecified: Secondary | ICD-10-CM | POA: Diagnosis not present

## 2015-02-02 DIAGNOSIS — I10 Essential (primary) hypertension: Secondary | ICD-10-CM | POA: Diagnosis not present

## 2015-02-02 DIAGNOSIS — Z7984 Long term (current) use of oral hypoglycemic drugs: Secondary | ICD-10-CM | POA: Diagnosis not present

## 2015-02-02 DIAGNOSIS — E119 Type 2 diabetes mellitus without complications: Secondary | ICD-10-CM | POA: Diagnosis not present

## 2015-02-02 DIAGNOSIS — G2581 Restless legs syndrome: Secondary | ICD-10-CM | POA: Diagnosis not present

## 2015-02-02 DIAGNOSIS — L03116 Cellulitis of left lower limb: Secondary | ICD-10-CM | POA: Diagnosis not present

## 2015-02-02 DIAGNOSIS — Z452 Encounter for adjustment and management of vascular access device: Secondary | ICD-10-CM | POA: Diagnosis not present

## 2015-02-02 DIAGNOSIS — R296 Repeated falls: Secondary | ICD-10-CM | POA: Diagnosis not present

## 2015-02-02 DIAGNOSIS — Z792 Long term (current) use of antibiotics: Secondary | ICD-10-CM | POA: Diagnosis not present

## 2015-02-03 DIAGNOSIS — E119 Type 2 diabetes mellitus without complications: Secondary | ICD-10-CM | POA: Diagnosis not present

## 2015-02-03 DIAGNOSIS — Z792 Long term (current) use of antibiotics: Secondary | ICD-10-CM | POA: Diagnosis not present

## 2015-02-03 DIAGNOSIS — R69 Illness, unspecified: Secondary | ICD-10-CM | POA: Diagnosis not present

## 2015-02-03 DIAGNOSIS — Z452 Encounter for adjustment and management of vascular access device: Secondary | ICD-10-CM | POA: Diagnosis not present

## 2015-02-03 DIAGNOSIS — R296 Repeated falls: Secondary | ICD-10-CM | POA: Diagnosis not present

## 2015-02-03 DIAGNOSIS — I10 Essential (primary) hypertension: Secondary | ICD-10-CM | POA: Diagnosis not present

## 2015-02-03 DIAGNOSIS — G2581 Restless legs syndrome: Secondary | ICD-10-CM | POA: Diagnosis not present

## 2015-02-03 DIAGNOSIS — Z7984 Long term (current) use of oral hypoglycemic drugs: Secondary | ICD-10-CM | POA: Diagnosis not present

## 2015-02-03 DIAGNOSIS — L03116 Cellulitis of left lower limb: Secondary | ICD-10-CM | POA: Diagnosis not present

## 2015-02-07 DIAGNOSIS — L03116 Cellulitis of left lower limb: Secondary | ICD-10-CM | POA: Diagnosis not present

## 2015-02-07 DIAGNOSIS — G2589 Other specified extrapyramidal and movement disorders: Secondary | ICD-10-CM | POA: Diagnosis not present

## 2015-02-08 DIAGNOSIS — Z452 Encounter for adjustment and management of vascular access device: Secondary | ICD-10-CM | POA: Diagnosis not present

## 2015-02-08 DIAGNOSIS — Z7984 Long term (current) use of oral hypoglycemic drugs: Secondary | ICD-10-CM | POA: Diagnosis not present

## 2015-02-08 DIAGNOSIS — R69 Illness, unspecified: Secondary | ICD-10-CM | POA: Diagnosis not present

## 2015-02-08 DIAGNOSIS — L03116 Cellulitis of left lower limb: Secondary | ICD-10-CM | POA: Diagnosis not present

## 2015-02-08 DIAGNOSIS — G2581 Restless legs syndrome: Secondary | ICD-10-CM | POA: Diagnosis not present

## 2015-02-08 DIAGNOSIS — R296 Repeated falls: Secondary | ICD-10-CM | POA: Diagnosis not present

## 2015-02-08 DIAGNOSIS — Z792 Long term (current) use of antibiotics: Secondary | ICD-10-CM | POA: Diagnosis not present

## 2015-02-08 DIAGNOSIS — E119 Type 2 diabetes mellitus without complications: Secondary | ICD-10-CM | POA: Diagnosis not present

## 2015-02-08 DIAGNOSIS — I10 Essential (primary) hypertension: Secondary | ICD-10-CM | POA: Diagnosis not present

## 2015-02-09 DIAGNOSIS — Z452 Encounter for adjustment and management of vascular access device: Secondary | ICD-10-CM | POA: Diagnosis not present

## 2015-02-09 DIAGNOSIS — Z792 Long term (current) use of antibiotics: Secondary | ICD-10-CM | POA: Diagnosis not present

## 2015-02-09 DIAGNOSIS — R296 Repeated falls: Secondary | ICD-10-CM | POA: Diagnosis not present

## 2015-02-09 DIAGNOSIS — E119 Type 2 diabetes mellitus without complications: Secondary | ICD-10-CM | POA: Diagnosis not present

## 2015-02-09 DIAGNOSIS — G2581 Restless legs syndrome: Secondary | ICD-10-CM | POA: Diagnosis not present

## 2015-02-09 DIAGNOSIS — Z7984 Long term (current) use of oral hypoglycemic drugs: Secondary | ICD-10-CM | POA: Diagnosis not present

## 2015-02-09 DIAGNOSIS — I10 Essential (primary) hypertension: Secondary | ICD-10-CM | POA: Diagnosis not present

## 2015-02-09 DIAGNOSIS — R69 Illness, unspecified: Secondary | ICD-10-CM | POA: Diagnosis not present

## 2015-02-09 DIAGNOSIS — L03116 Cellulitis of left lower limb: Secondary | ICD-10-CM | POA: Diagnosis not present

## 2015-02-10 DIAGNOSIS — Z96651 Presence of right artificial knee joint: Secondary | ICD-10-CM | POA: Diagnosis not present

## 2015-02-10 DIAGNOSIS — L03116 Cellulitis of left lower limb: Secondary | ICD-10-CM | POA: Diagnosis not present

## 2015-02-10 DIAGNOSIS — Z7984 Long term (current) use of oral hypoglycemic drugs: Secondary | ICD-10-CM | POA: Diagnosis not present

## 2015-02-10 DIAGNOSIS — E1140 Type 2 diabetes mellitus with diabetic neuropathy, unspecified: Secondary | ICD-10-CM | POA: Diagnosis not present

## 2015-02-10 DIAGNOSIS — I10 Essential (primary) hypertension: Secondary | ICD-10-CM | POA: Diagnosis not present

## 2015-02-11 DIAGNOSIS — E119 Type 2 diabetes mellitus without complications: Secondary | ICD-10-CM | POA: Diagnosis not present

## 2015-02-11 DIAGNOSIS — R296 Repeated falls: Secondary | ICD-10-CM | POA: Diagnosis not present

## 2015-02-11 DIAGNOSIS — Z452 Encounter for adjustment and management of vascular access device: Secondary | ICD-10-CM | POA: Diagnosis not present

## 2015-02-11 DIAGNOSIS — Z7984 Long term (current) use of oral hypoglycemic drugs: Secondary | ICD-10-CM | POA: Diagnosis not present

## 2015-02-11 DIAGNOSIS — L03116 Cellulitis of left lower limb: Secondary | ICD-10-CM | POA: Diagnosis not present

## 2015-02-11 DIAGNOSIS — R69 Illness, unspecified: Secondary | ICD-10-CM | POA: Diagnosis not present

## 2015-02-11 DIAGNOSIS — Z792 Long term (current) use of antibiotics: Secondary | ICD-10-CM | POA: Diagnosis not present

## 2015-02-11 DIAGNOSIS — I10 Essential (primary) hypertension: Secondary | ICD-10-CM | POA: Diagnosis not present

## 2015-02-11 DIAGNOSIS — G2581 Restless legs syndrome: Secondary | ICD-10-CM | POA: Diagnosis not present

## 2015-02-15 DIAGNOSIS — I10 Essential (primary) hypertension: Secondary | ICD-10-CM | POA: Diagnosis not present

## 2015-02-15 DIAGNOSIS — Z7984 Long term (current) use of oral hypoglycemic drugs: Secondary | ICD-10-CM | POA: Diagnosis not present

## 2015-02-15 DIAGNOSIS — E1140 Type 2 diabetes mellitus with diabetic neuropathy, unspecified: Secondary | ICD-10-CM | POA: Diagnosis not present

## 2015-02-15 DIAGNOSIS — L03116 Cellulitis of left lower limb: Secondary | ICD-10-CM | POA: Diagnosis not present

## 2015-02-15 DIAGNOSIS — Z96651 Presence of right artificial knee joint: Secondary | ICD-10-CM | POA: Diagnosis not present

## 2015-02-16 DIAGNOSIS — M6281 Muscle weakness (generalized): Secondary | ICD-10-CM | POA: Diagnosis not present

## 2015-02-16 DIAGNOSIS — K5900 Constipation, unspecified: Secondary | ICD-10-CM | POA: Diagnosis not present

## 2015-02-16 DIAGNOSIS — I83019 Varicose veins of right lower extremity with ulcer of unspecified site: Secondary | ICD-10-CM | POA: Diagnosis not present

## 2015-02-16 DIAGNOSIS — G4733 Obstructive sleep apnea (adult) (pediatric): Secondary | ICD-10-CM | POA: Diagnosis not present

## 2015-02-16 DIAGNOSIS — R69 Illness, unspecified: Secondary | ICD-10-CM | POA: Diagnosis not present

## 2015-02-16 DIAGNOSIS — I10 Essential (primary) hypertension: Secondary | ICD-10-CM | POA: Diagnosis not present

## 2015-02-16 DIAGNOSIS — F419 Anxiety disorder, unspecified: Secondary | ICD-10-CM | POA: Diagnosis not present

## 2015-02-16 DIAGNOSIS — E039 Hypothyroidism, unspecified: Secondary | ICD-10-CM | POA: Diagnosis not present

## 2015-02-16 DIAGNOSIS — E11620 Type 2 diabetes mellitus with diabetic dermatitis: Secondary | ICD-10-CM | POA: Diagnosis not present

## 2015-02-16 DIAGNOSIS — I872 Venous insufficiency (chronic) (peripheral): Secondary | ICD-10-CM | POA: Diagnosis not present

## 2015-02-16 DIAGNOSIS — R5381 Other malaise: Secondary | ICD-10-CM | POA: Diagnosis not present

## 2015-02-16 DIAGNOSIS — E119 Type 2 diabetes mellitus without complications: Secondary | ICD-10-CM | POA: Diagnosis not present

## 2015-02-16 DIAGNOSIS — F329 Major depressive disorder, single episode, unspecified: Secondary | ICD-10-CM | POA: Diagnosis not present

## 2015-02-16 DIAGNOSIS — E669 Obesity, unspecified: Secondary | ICD-10-CM | POA: Diagnosis not present

## 2015-02-16 DIAGNOSIS — I890 Lymphedema, not elsewhere classified: Secondary | ICD-10-CM | POA: Diagnosis not present

## 2015-02-16 DIAGNOSIS — L97829 Non-pressure chronic ulcer of other part of left lower leg with unspecified severity: Secondary | ICD-10-CM | POA: Diagnosis not present

## 2015-02-16 DIAGNOSIS — G4700 Insomnia, unspecified: Secondary | ICD-10-CM | POA: Diagnosis not present

## 2015-02-16 DIAGNOSIS — E876 Hypokalemia: Secondary | ICD-10-CM | POA: Diagnosis not present

## 2015-02-16 DIAGNOSIS — L03116 Cellulitis of left lower limb: Secondary | ICD-10-CM | POA: Diagnosis not present

## 2015-02-16 DIAGNOSIS — G2581 Restless legs syndrome: Secondary | ICD-10-CM | POA: Diagnosis not present

## 2015-02-16 DIAGNOSIS — R609 Edema, unspecified: Secondary | ICD-10-CM | POA: Diagnosis not present

## 2015-02-16 DIAGNOSIS — Z8679 Personal history of other diseases of the circulatory system: Secondary | ICD-10-CM | POA: Diagnosis not present

## 2015-02-18 DIAGNOSIS — E669 Obesity, unspecified: Secondary | ICD-10-CM | POA: Diagnosis not present

## 2015-02-18 DIAGNOSIS — R5381 Other malaise: Secondary | ICD-10-CM | POA: Diagnosis not present

## 2015-02-18 DIAGNOSIS — R69 Illness, unspecified: Secondary | ICD-10-CM | POA: Diagnosis not present

## 2015-02-18 DIAGNOSIS — I83019 Varicose veins of right lower extremity with ulcer of unspecified site: Secondary | ICD-10-CM | POA: Diagnosis not present

## 2015-02-18 DIAGNOSIS — K5900 Constipation, unspecified: Secondary | ICD-10-CM | POA: Diagnosis not present

## 2015-02-18 DIAGNOSIS — R609 Edema, unspecified: Secondary | ICD-10-CM | POA: Diagnosis not present

## 2015-02-18 DIAGNOSIS — E11620 Type 2 diabetes mellitus with diabetic dermatitis: Secondary | ICD-10-CM | POA: Diagnosis not present

## 2015-02-18 DIAGNOSIS — E039 Hypothyroidism, unspecified: Secondary | ICD-10-CM | POA: Diagnosis not present

## 2015-02-18 DIAGNOSIS — E876 Hypokalemia: Secondary | ICD-10-CM | POA: Diagnosis not present

## 2015-02-18 DIAGNOSIS — Z8679 Personal history of other diseases of the circulatory system: Secondary | ICD-10-CM | POA: Diagnosis not present

## 2015-03-03 DIAGNOSIS — Z7984 Long term (current) use of oral hypoglycemic drugs: Secondary | ICD-10-CM | POA: Diagnosis not present

## 2015-03-03 DIAGNOSIS — G2589 Other specified extrapyramidal and movement disorders: Secondary | ICD-10-CM | POA: Diagnosis not present

## 2015-03-03 DIAGNOSIS — Z96651 Presence of right artificial knee joint: Secondary | ICD-10-CM | POA: Diagnosis not present

## 2015-03-03 DIAGNOSIS — L97921 Non-pressure chronic ulcer of unspecified part of left lower leg limited to breakdown of skin: Secondary | ICD-10-CM | POA: Diagnosis not present

## 2015-03-03 DIAGNOSIS — L03116 Cellulitis of left lower limb: Secondary | ICD-10-CM | POA: Diagnosis not present

## 2015-03-03 DIAGNOSIS — E1140 Type 2 diabetes mellitus with diabetic neuropathy, unspecified: Secondary | ICD-10-CM | POA: Diagnosis not present

## 2015-03-03 DIAGNOSIS — I10 Essential (primary) hypertension: Secondary | ICD-10-CM | POA: Diagnosis not present

## 2015-03-08 DIAGNOSIS — E039 Hypothyroidism, unspecified: Secondary | ICD-10-CM | POA: Diagnosis not present

## 2015-03-08 DIAGNOSIS — R609 Edema, unspecified: Secondary | ICD-10-CM | POA: Diagnosis not present

## 2015-03-08 DIAGNOSIS — N3000 Acute cystitis without hematuria: Secondary | ICD-10-CM | POA: Diagnosis not present

## 2015-03-08 DIAGNOSIS — E1129 Type 2 diabetes mellitus with other diabetic kidney complication: Secondary | ICD-10-CM | POA: Diagnosis not present

## 2015-03-08 DIAGNOSIS — G2581 Restless legs syndrome: Secondary | ICD-10-CM | POA: Diagnosis not present

## 2015-03-08 DIAGNOSIS — J069 Acute upper respiratory infection, unspecified: Secondary | ICD-10-CM | POA: Diagnosis not present

## 2015-03-09 DIAGNOSIS — Z7984 Long term (current) use of oral hypoglycemic drugs: Secondary | ICD-10-CM | POA: Diagnosis not present

## 2015-03-09 DIAGNOSIS — E11620 Type 2 diabetes mellitus with diabetic dermatitis: Secondary | ICD-10-CM | POA: Diagnosis not present

## 2015-03-09 DIAGNOSIS — R69 Illness, unspecified: Secondary | ICD-10-CM | POA: Diagnosis not present

## 2015-03-09 DIAGNOSIS — L97221 Non-pressure chronic ulcer of left calf limited to breakdown of skin: Secondary | ICD-10-CM | POA: Diagnosis not present

## 2015-03-09 DIAGNOSIS — E669 Obesity, unspecified: Secondary | ICD-10-CM | POA: Diagnosis not present

## 2015-03-09 DIAGNOSIS — I509 Heart failure, unspecified: Secondary | ICD-10-CM | POA: Diagnosis not present

## 2015-03-09 DIAGNOSIS — I872 Venous insufficiency (chronic) (peripheral): Secondary | ICD-10-CM | POA: Diagnosis not present

## 2015-03-09 DIAGNOSIS — L97321 Non-pressure chronic ulcer of left ankle limited to breakdown of skin: Secondary | ICD-10-CM | POA: Diagnosis not present

## 2015-03-10 DIAGNOSIS — L97221 Non-pressure chronic ulcer of left calf limited to breakdown of skin: Secondary | ICD-10-CM | POA: Diagnosis not present

## 2015-03-10 DIAGNOSIS — L97321 Non-pressure chronic ulcer of left ankle limited to breakdown of skin: Secondary | ICD-10-CM | POA: Diagnosis not present

## 2015-03-10 DIAGNOSIS — I872 Venous insufficiency (chronic) (peripheral): Secondary | ICD-10-CM | POA: Diagnosis not present

## 2015-03-10 DIAGNOSIS — E669 Obesity, unspecified: Secondary | ICD-10-CM | POA: Diagnosis not present

## 2015-03-10 DIAGNOSIS — R69 Illness, unspecified: Secondary | ICD-10-CM | POA: Diagnosis not present

## 2015-03-10 DIAGNOSIS — E11620 Type 2 diabetes mellitus with diabetic dermatitis: Secondary | ICD-10-CM | POA: Diagnosis not present

## 2015-03-10 DIAGNOSIS — I509 Heart failure, unspecified: Secondary | ICD-10-CM | POA: Diagnosis not present

## 2015-03-10 DIAGNOSIS — Z7984 Long term (current) use of oral hypoglycemic drugs: Secondary | ICD-10-CM | POA: Diagnosis not present

## 2015-03-11 DIAGNOSIS — Z7984 Long term (current) use of oral hypoglycemic drugs: Secondary | ICD-10-CM | POA: Diagnosis not present

## 2015-03-11 DIAGNOSIS — R69 Illness, unspecified: Secondary | ICD-10-CM | POA: Diagnosis not present

## 2015-03-11 DIAGNOSIS — I509 Heart failure, unspecified: Secondary | ICD-10-CM | POA: Diagnosis not present

## 2015-03-11 DIAGNOSIS — E11620 Type 2 diabetes mellitus with diabetic dermatitis: Secondary | ICD-10-CM | POA: Diagnosis not present

## 2015-03-11 DIAGNOSIS — E669 Obesity, unspecified: Secondary | ICD-10-CM | POA: Diagnosis not present

## 2015-03-11 DIAGNOSIS — L97221 Non-pressure chronic ulcer of left calf limited to breakdown of skin: Secondary | ICD-10-CM | POA: Diagnosis not present

## 2015-03-11 DIAGNOSIS — I872 Venous insufficiency (chronic) (peripheral): Secondary | ICD-10-CM | POA: Diagnosis not present

## 2015-03-11 DIAGNOSIS — L97321 Non-pressure chronic ulcer of left ankle limited to breakdown of skin: Secondary | ICD-10-CM | POA: Diagnosis not present

## 2015-03-14 DIAGNOSIS — L97221 Non-pressure chronic ulcer of left calf limited to breakdown of skin: Secondary | ICD-10-CM | POA: Diagnosis not present

## 2015-03-14 DIAGNOSIS — L97321 Non-pressure chronic ulcer of left ankle limited to breakdown of skin: Secondary | ICD-10-CM | POA: Diagnosis not present

## 2015-03-14 DIAGNOSIS — E11620 Type 2 diabetes mellitus with diabetic dermatitis: Secondary | ICD-10-CM | POA: Diagnosis not present

## 2015-03-14 DIAGNOSIS — R69 Illness, unspecified: Secondary | ICD-10-CM | POA: Diagnosis not present

## 2015-03-14 DIAGNOSIS — E669 Obesity, unspecified: Secondary | ICD-10-CM | POA: Diagnosis not present

## 2015-03-14 DIAGNOSIS — I509 Heart failure, unspecified: Secondary | ICD-10-CM | POA: Diagnosis not present

## 2015-03-14 DIAGNOSIS — Z7984 Long term (current) use of oral hypoglycemic drugs: Secondary | ICD-10-CM | POA: Diagnosis not present

## 2015-03-14 DIAGNOSIS — I872 Venous insufficiency (chronic) (peripheral): Secondary | ICD-10-CM | POA: Diagnosis not present

## 2015-03-15 DIAGNOSIS — L97321 Non-pressure chronic ulcer of left ankle limited to breakdown of skin: Secondary | ICD-10-CM | POA: Diagnosis not present

## 2015-03-15 DIAGNOSIS — R69 Illness, unspecified: Secondary | ICD-10-CM | POA: Diagnosis not present

## 2015-03-15 DIAGNOSIS — E669 Obesity, unspecified: Secondary | ICD-10-CM | POA: Diagnosis not present

## 2015-03-15 DIAGNOSIS — Z7984 Long term (current) use of oral hypoglycemic drugs: Secondary | ICD-10-CM | POA: Diagnosis not present

## 2015-03-15 DIAGNOSIS — E11620 Type 2 diabetes mellitus with diabetic dermatitis: Secondary | ICD-10-CM | POA: Diagnosis not present

## 2015-03-15 DIAGNOSIS — I509 Heart failure, unspecified: Secondary | ICD-10-CM | POA: Diagnosis not present

## 2015-03-15 DIAGNOSIS — L97221 Non-pressure chronic ulcer of left calf limited to breakdown of skin: Secondary | ICD-10-CM | POA: Diagnosis not present

## 2015-03-15 DIAGNOSIS — I872 Venous insufficiency (chronic) (peripheral): Secondary | ICD-10-CM | POA: Diagnosis not present

## 2015-03-16 DIAGNOSIS — R69 Illness, unspecified: Secondary | ICD-10-CM | POA: Diagnosis not present

## 2015-03-16 DIAGNOSIS — Z7984 Long term (current) use of oral hypoglycemic drugs: Secondary | ICD-10-CM | POA: Diagnosis not present

## 2015-03-16 DIAGNOSIS — I872 Venous insufficiency (chronic) (peripheral): Secondary | ICD-10-CM | POA: Diagnosis not present

## 2015-03-16 DIAGNOSIS — E669 Obesity, unspecified: Secondary | ICD-10-CM | POA: Diagnosis not present

## 2015-03-16 DIAGNOSIS — L97321 Non-pressure chronic ulcer of left ankle limited to breakdown of skin: Secondary | ICD-10-CM | POA: Diagnosis not present

## 2015-03-16 DIAGNOSIS — E11620 Type 2 diabetes mellitus with diabetic dermatitis: Secondary | ICD-10-CM | POA: Diagnosis not present

## 2015-03-16 DIAGNOSIS — I509 Heart failure, unspecified: Secondary | ICD-10-CM | POA: Diagnosis not present

## 2015-03-16 DIAGNOSIS — L97221 Non-pressure chronic ulcer of left calf limited to breakdown of skin: Secondary | ICD-10-CM | POA: Diagnosis not present

## 2015-03-21 DIAGNOSIS — L97321 Non-pressure chronic ulcer of left ankle limited to breakdown of skin: Secondary | ICD-10-CM | POA: Diagnosis not present

## 2015-03-21 DIAGNOSIS — I872 Venous insufficiency (chronic) (peripheral): Secondary | ICD-10-CM | POA: Diagnosis not present

## 2015-03-21 DIAGNOSIS — Z7984 Long term (current) use of oral hypoglycemic drugs: Secondary | ICD-10-CM | POA: Diagnosis not present

## 2015-03-21 DIAGNOSIS — I509 Heart failure, unspecified: Secondary | ICD-10-CM | POA: Diagnosis not present

## 2015-03-21 DIAGNOSIS — R69 Illness, unspecified: Secondary | ICD-10-CM | POA: Diagnosis not present

## 2015-03-21 DIAGNOSIS — E669 Obesity, unspecified: Secondary | ICD-10-CM | POA: Diagnosis not present

## 2015-03-21 DIAGNOSIS — L97221 Non-pressure chronic ulcer of left calf limited to breakdown of skin: Secondary | ICD-10-CM | POA: Diagnosis not present

## 2015-03-21 DIAGNOSIS — E11620 Type 2 diabetes mellitus with diabetic dermatitis: Secondary | ICD-10-CM | POA: Diagnosis not present

## 2015-03-22 DIAGNOSIS — I509 Heart failure, unspecified: Secondary | ICD-10-CM | POA: Diagnosis not present

## 2015-03-22 DIAGNOSIS — Z7984 Long term (current) use of oral hypoglycemic drugs: Secondary | ICD-10-CM | POA: Diagnosis not present

## 2015-03-22 DIAGNOSIS — L97221 Non-pressure chronic ulcer of left calf limited to breakdown of skin: Secondary | ICD-10-CM | POA: Diagnosis not present

## 2015-03-22 DIAGNOSIS — E11620 Type 2 diabetes mellitus with diabetic dermatitis: Secondary | ICD-10-CM | POA: Diagnosis not present

## 2015-03-22 DIAGNOSIS — R69 Illness, unspecified: Secondary | ICD-10-CM | POA: Diagnosis not present

## 2015-03-22 DIAGNOSIS — I872 Venous insufficiency (chronic) (peripheral): Secondary | ICD-10-CM | POA: Diagnosis not present

## 2015-03-22 DIAGNOSIS — L97321 Non-pressure chronic ulcer of left ankle limited to breakdown of skin: Secondary | ICD-10-CM | POA: Diagnosis not present

## 2015-03-22 DIAGNOSIS — E669 Obesity, unspecified: Secondary | ICD-10-CM | POA: Diagnosis not present

## 2015-03-23 DIAGNOSIS — I83009 Varicose veins of unspecified lower extremity with ulcer of unspecified site: Secondary | ICD-10-CM | POA: Diagnosis not present

## 2015-03-24 DIAGNOSIS — L97321 Non-pressure chronic ulcer of left ankle limited to breakdown of skin: Secondary | ICD-10-CM | POA: Diagnosis not present

## 2015-03-24 DIAGNOSIS — I872 Venous insufficiency (chronic) (peripheral): Secondary | ICD-10-CM | POA: Diagnosis not present

## 2015-03-24 DIAGNOSIS — Z7984 Long term (current) use of oral hypoglycemic drugs: Secondary | ICD-10-CM | POA: Diagnosis not present

## 2015-03-24 DIAGNOSIS — L97221 Non-pressure chronic ulcer of left calf limited to breakdown of skin: Secondary | ICD-10-CM | POA: Diagnosis not present

## 2015-03-24 DIAGNOSIS — E669 Obesity, unspecified: Secondary | ICD-10-CM | POA: Diagnosis not present

## 2015-03-24 DIAGNOSIS — R69 Illness, unspecified: Secondary | ICD-10-CM | POA: Diagnosis not present

## 2015-03-24 DIAGNOSIS — I509 Heart failure, unspecified: Secondary | ICD-10-CM | POA: Diagnosis not present

## 2015-03-24 DIAGNOSIS — E11620 Type 2 diabetes mellitus with diabetic dermatitis: Secondary | ICD-10-CM | POA: Diagnosis not present

## 2015-03-25 DIAGNOSIS — I509 Heart failure, unspecified: Secondary | ICD-10-CM | POA: Diagnosis not present

## 2015-03-25 DIAGNOSIS — E669 Obesity, unspecified: Secondary | ICD-10-CM | POA: Diagnosis not present

## 2015-03-25 DIAGNOSIS — R69 Illness, unspecified: Secondary | ICD-10-CM | POA: Diagnosis not present

## 2015-03-25 DIAGNOSIS — L97221 Non-pressure chronic ulcer of left calf limited to breakdown of skin: Secondary | ICD-10-CM | POA: Diagnosis not present

## 2015-03-25 DIAGNOSIS — L97321 Non-pressure chronic ulcer of left ankle limited to breakdown of skin: Secondary | ICD-10-CM | POA: Diagnosis not present

## 2015-03-25 DIAGNOSIS — E11620 Type 2 diabetes mellitus with diabetic dermatitis: Secondary | ICD-10-CM | POA: Diagnosis not present

## 2015-03-25 DIAGNOSIS — Z7984 Long term (current) use of oral hypoglycemic drugs: Secondary | ICD-10-CM | POA: Diagnosis not present

## 2015-03-25 DIAGNOSIS — I872 Venous insufficiency (chronic) (peripheral): Secondary | ICD-10-CM | POA: Diagnosis not present

## 2015-03-28 DIAGNOSIS — L97321 Non-pressure chronic ulcer of left ankle limited to breakdown of skin: Secondary | ICD-10-CM | POA: Diagnosis not present

## 2015-03-28 DIAGNOSIS — E11620 Type 2 diabetes mellitus with diabetic dermatitis: Secondary | ICD-10-CM | POA: Diagnosis not present

## 2015-03-28 DIAGNOSIS — E669 Obesity, unspecified: Secondary | ICD-10-CM | POA: Diagnosis not present

## 2015-03-28 DIAGNOSIS — I872 Venous insufficiency (chronic) (peripheral): Secondary | ICD-10-CM | POA: Diagnosis not present

## 2015-03-28 DIAGNOSIS — Z7984 Long term (current) use of oral hypoglycemic drugs: Secondary | ICD-10-CM | POA: Diagnosis not present

## 2015-03-28 DIAGNOSIS — R69 Illness, unspecified: Secondary | ICD-10-CM | POA: Diagnosis not present

## 2015-03-28 DIAGNOSIS — L97221 Non-pressure chronic ulcer of left calf limited to breakdown of skin: Secondary | ICD-10-CM | POA: Diagnosis not present

## 2015-03-28 DIAGNOSIS — I509 Heart failure, unspecified: Secondary | ICD-10-CM | POA: Diagnosis not present

## 2015-03-29 DIAGNOSIS — L97221 Non-pressure chronic ulcer of left calf limited to breakdown of skin: Secondary | ICD-10-CM | POA: Diagnosis not present

## 2015-03-29 DIAGNOSIS — I10 Essential (primary) hypertension: Secondary | ICD-10-CM | POA: Diagnosis not present

## 2015-03-29 DIAGNOSIS — Z96651 Presence of right artificial knee joint: Secondary | ICD-10-CM | POA: Diagnosis not present

## 2015-03-29 DIAGNOSIS — Z7984 Long term (current) use of oral hypoglycemic drugs: Secondary | ICD-10-CM | POA: Diagnosis not present

## 2015-03-29 DIAGNOSIS — L97321 Non-pressure chronic ulcer of left ankle limited to breakdown of skin: Secondary | ICD-10-CM | POA: Diagnosis not present

## 2015-03-29 DIAGNOSIS — R69 Illness, unspecified: Secondary | ICD-10-CM | POA: Diagnosis not present

## 2015-03-29 DIAGNOSIS — I509 Heart failure, unspecified: Secondary | ICD-10-CM | POA: Diagnosis not present

## 2015-03-29 DIAGNOSIS — I872 Venous insufficiency (chronic) (peripheral): Secondary | ICD-10-CM | POA: Diagnosis not present

## 2015-03-29 DIAGNOSIS — E11620 Type 2 diabetes mellitus with diabetic dermatitis: Secondary | ICD-10-CM | POA: Diagnosis not present

## 2015-03-29 DIAGNOSIS — L97921 Non-pressure chronic ulcer of unspecified part of left lower leg limited to breakdown of skin: Secondary | ICD-10-CM | POA: Diagnosis not present

## 2015-03-29 DIAGNOSIS — E669 Obesity, unspecified: Secondary | ICD-10-CM | POA: Diagnosis not present

## 2015-03-29 DIAGNOSIS — E1140 Type 2 diabetes mellitus with diabetic neuropathy, unspecified: Secondary | ICD-10-CM | POA: Diagnosis not present

## 2015-03-30 DIAGNOSIS — L97221 Non-pressure chronic ulcer of left calf limited to breakdown of skin: Secondary | ICD-10-CM | POA: Diagnosis not present

## 2015-03-30 DIAGNOSIS — I872 Venous insufficiency (chronic) (peripheral): Secondary | ICD-10-CM | POA: Diagnosis not present

## 2015-03-30 DIAGNOSIS — I509 Heart failure, unspecified: Secondary | ICD-10-CM | POA: Diagnosis not present

## 2015-03-30 DIAGNOSIS — R69 Illness, unspecified: Secondary | ICD-10-CM | POA: Diagnosis not present

## 2015-03-30 DIAGNOSIS — E11620 Type 2 diabetes mellitus with diabetic dermatitis: Secondary | ICD-10-CM | POA: Diagnosis not present

## 2015-03-30 DIAGNOSIS — Z7984 Long term (current) use of oral hypoglycemic drugs: Secondary | ICD-10-CM | POA: Diagnosis not present

## 2015-03-30 DIAGNOSIS — L97321 Non-pressure chronic ulcer of left ankle limited to breakdown of skin: Secondary | ICD-10-CM | POA: Diagnosis not present

## 2015-03-30 DIAGNOSIS — E669 Obesity, unspecified: Secondary | ICD-10-CM | POA: Diagnosis not present

## 2015-04-01 DIAGNOSIS — E11620 Type 2 diabetes mellitus with diabetic dermatitis: Secondary | ICD-10-CM | POA: Diagnosis not present

## 2015-04-01 DIAGNOSIS — E039 Hypothyroidism, unspecified: Secondary | ICD-10-CM | POA: Diagnosis not present

## 2015-04-01 DIAGNOSIS — G2581 Restless legs syndrome: Secondary | ICD-10-CM | POA: Diagnosis not present

## 2015-04-01 DIAGNOSIS — I509 Heart failure, unspecified: Secondary | ICD-10-CM | POA: Diagnosis not present

## 2015-04-01 DIAGNOSIS — N3000 Acute cystitis without hematuria: Secondary | ICD-10-CM | POA: Diagnosis not present

## 2015-04-01 DIAGNOSIS — E1129 Type 2 diabetes mellitus with other diabetic kidney complication: Secondary | ICD-10-CM | POA: Diagnosis not present

## 2015-04-01 DIAGNOSIS — R609 Edema, unspecified: Secondary | ICD-10-CM | POA: Diagnosis not present

## 2015-04-01 DIAGNOSIS — R69 Illness, unspecified: Secondary | ICD-10-CM | POA: Diagnosis not present

## 2015-04-01 DIAGNOSIS — L97221 Non-pressure chronic ulcer of left calf limited to breakdown of skin: Secondary | ICD-10-CM | POA: Diagnosis not present

## 2015-04-01 DIAGNOSIS — E669 Obesity, unspecified: Secondary | ICD-10-CM | POA: Diagnosis not present

## 2015-04-01 DIAGNOSIS — Z7984 Long term (current) use of oral hypoglycemic drugs: Secondary | ICD-10-CM | POA: Diagnosis not present

## 2015-04-01 DIAGNOSIS — I872 Venous insufficiency (chronic) (peripheral): Secondary | ICD-10-CM | POA: Diagnosis not present

## 2015-04-01 DIAGNOSIS — L97321 Non-pressure chronic ulcer of left ankle limited to breakdown of skin: Secondary | ICD-10-CM | POA: Diagnosis not present

## 2015-04-01 DIAGNOSIS — J069 Acute upper respiratory infection, unspecified: Secondary | ICD-10-CM | POA: Diagnosis not present

## 2015-04-03 DIAGNOSIS — I6523 Occlusion and stenosis of bilateral carotid arteries: Secondary | ICD-10-CM | POA: Diagnosis not present

## 2015-04-03 DIAGNOSIS — E039 Hypothyroidism, unspecified: Secondary | ICD-10-CM | POA: Diagnosis not present

## 2015-04-03 DIAGNOSIS — Z794 Long term (current) use of insulin: Secondary | ICD-10-CM | POA: Diagnosis not present

## 2015-04-03 DIAGNOSIS — L03116 Cellulitis of left lower limb: Secondary | ICD-10-CM | POA: Diagnosis not present

## 2015-04-03 DIAGNOSIS — R69 Illness, unspecified: Secondary | ICD-10-CM | POA: Diagnosis not present

## 2015-04-03 DIAGNOSIS — R600 Localized edema: Secondary | ICD-10-CM | POA: Diagnosis not present

## 2015-04-03 DIAGNOSIS — L97821 Non-pressure chronic ulcer of other part of left lower leg limited to breakdown of skin: Secondary | ICD-10-CM | POA: Diagnosis not present

## 2015-04-03 DIAGNOSIS — G4733 Obstructive sleep apnea (adult) (pediatric): Secondary | ICD-10-CM | POA: Diagnosis not present

## 2015-04-03 DIAGNOSIS — E11622 Type 2 diabetes mellitus with other skin ulcer: Secondary | ICD-10-CM | POA: Diagnosis not present

## 2015-04-03 DIAGNOSIS — I10 Essential (primary) hypertension: Secondary | ICD-10-CM | POA: Diagnosis not present

## 2015-04-03 DIAGNOSIS — Z6841 Body Mass Index (BMI) 40.0 and over, adult: Secondary | ICD-10-CM | POA: Diagnosis not present

## 2015-04-03 DIAGNOSIS — R509 Fever, unspecified: Secondary | ICD-10-CM | POA: Diagnosis not present

## 2015-04-11 DIAGNOSIS — H5203 Hypermetropia, bilateral: Secondary | ICD-10-CM | POA: Diagnosis not present

## 2015-04-11 DIAGNOSIS — H43811 Vitreous degeneration, right eye: Secondary | ICD-10-CM | POA: Diagnosis not present

## 2015-04-11 DIAGNOSIS — H524 Presbyopia: Secondary | ICD-10-CM | POA: Diagnosis not present

## 2015-04-11 DIAGNOSIS — Z79899 Other long term (current) drug therapy: Secondary | ICD-10-CM | POA: Diagnosis not present

## 2015-04-11 DIAGNOSIS — H2513 Age-related nuclear cataract, bilateral: Secondary | ICD-10-CM | POA: Diagnosis not present

## 2015-04-11 DIAGNOSIS — H52223 Regular astigmatism, bilateral: Secondary | ICD-10-CM | POA: Diagnosis not present

## 2015-04-11 DIAGNOSIS — E119 Type 2 diabetes mellitus without complications: Secondary | ICD-10-CM | POA: Diagnosis not present

## 2015-04-15 DIAGNOSIS — R69 Illness, unspecified: Secondary | ICD-10-CM | POA: Diagnosis not present

## 2015-04-15 DIAGNOSIS — I872 Venous insufficiency (chronic) (peripheral): Secondary | ICD-10-CM | POA: Diagnosis not present

## 2015-04-15 DIAGNOSIS — L97221 Non-pressure chronic ulcer of left calf limited to breakdown of skin: Secondary | ICD-10-CM | POA: Diagnosis not present

## 2015-04-15 DIAGNOSIS — E11620 Type 2 diabetes mellitus with diabetic dermatitis: Secondary | ICD-10-CM | POA: Diagnosis not present

## 2015-04-15 DIAGNOSIS — E669 Obesity, unspecified: Secondary | ICD-10-CM | POA: Diagnosis not present

## 2015-04-15 DIAGNOSIS — I509 Heart failure, unspecified: Secondary | ICD-10-CM | POA: Diagnosis not present

## 2015-04-15 DIAGNOSIS — L97321 Non-pressure chronic ulcer of left ankle limited to breakdown of skin: Secondary | ICD-10-CM | POA: Diagnosis not present

## 2015-04-15 DIAGNOSIS — Z7984 Long term (current) use of oral hypoglycemic drugs: Secondary | ICD-10-CM | POA: Diagnosis not present

## 2015-04-20 DIAGNOSIS — E669 Obesity, unspecified: Secondary | ICD-10-CM | POA: Diagnosis not present

## 2015-04-20 DIAGNOSIS — I509 Heart failure, unspecified: Secondary | ICD-10-CM | POA: Diagnosis not present

## 2015-04-20 DIAGNOSIS — L97221 Non-pressure chronic ulcer of left calf limited to breakdown of skin: Secondary | ICD-10-CM | POA: Diagnosis not present

## 2015-04-20 DIAGNOSIS — I872 Venous insufficiency (chronic) (peripheral): Secondary | ICD-10-CM | POA: Diagnosis not present

## 2015-04-20 DIAGNOSIS — E11620 Type 2 diabetes mellitus with diabetic dermatitis: Secondary | ICD-10-CM | POA: Diagnosis not present

## 2015-04-20 DIAGNOSIS — Z7984 Long term (current) use of oral hypoglycemic drugs: Secondary | ICD-10-CM | POA: Diagnosis not present

## 2015-04-20 DIAGNOSIS — L97321 Non-pressure chronic ulcer of left ankle limited to breakdown of skin: Secondary | ICD-10-CM | POA: Diagnosis not present

## 2015-04-20 DIAGNOSIS — R69 Illness, unspecified: Secondary | ICD-10-CM | POA: Diagnosis not present

## 2015-04-21 DIAGNOSIS — R0602 Shortness of breath: Secondary | ICD-10-CM | POA: Diagnosis not present

## 2015-04-21 DIAGNOSIS — I872 Venous insufficiency (chronic) (peripheral): Secondary | ICD-10-CM | POA: Diagnosis not present

## 2015-04-21 DIAGNOSIS — E11620 Type 2 diabetes mellitus with diabetic dermatitis: Secondary | ICD-10-CM | POA: Diagnosis not present

## 2015-04-21 DIAGNOSIS — Z7984 Long term (current) use of oral hypoglycemic drugs: Secondary | ICD-10-CM | POA: Diagnosis not present

## 2015-04-21 DIAGNOSIS — I509 Heart failure, unspecified: Secondary | ICD-10-CM | POA: Diagnosis not present

## 2015-04-21 DIAGNOSIS — R69 Illness, unspecified: Secondary | ICD-10-CM | POA: Diagnosis not present

## 2015-04-21 DIAGNOSIS — L97221 Non-pressure chronic ulcer of left calf limited to breakdown of skin: Secondary | ICD-10-CM | POA: Diagnosis not present

## 2015-04-21 DIAGNOSIS — E669 Obesity, unspecified: Secondary | ICD-10-CM | POA: Diagnosis not present

## 2015-04-21 DIAGNOSIS — L97321 Non-pressure chronic ulcer of left ankle limited to breakdown of skin: Secondary | ICD-10-CM | POA: Diagnosis not present

## 2015-04-23 DIAGNOSIS — E11620 Type 2 diabetes mellitus with diabetic dermatitis: Secondary | ICD-10-CM | POA: Diagnosis not present

## 2015-04-23 DIAGNOSIS — Z7984 Long term (current) use of oral hypoglycemic drugs: Secondary | ICD-10-CM | POA: Diagnosis not present

## 2015-04-23 DIAGNOSIS — L97221 Non-pressure chronic ulcer of left calf limited to breakdown of skin: Secondary | ICD-10-CM | POA: Diagnosis not present

## 2015-04-23 DIAGNOSIS — L97321 Non-pressure chronic ulcer of left ankle limited to breakdown of skin: Secondary | ICD-10-CM | POA: Diagnosis not present

## 2015-04-23 DIAGNOSIS — E669 Obesity, unspecified: Secondary | ICD-10-CM | POA: Diagnosis not present

## 2015-04-23 DIAGNOSIS — I509 Heart failure, unspecified: Secondary | ICD-10-CM | POA: Diagnosis not present

## 2015-04-23 DIAGNOSIS — R69 Illness, unspecified: Secondary | ICD-10-CM | POA: Diagnosis not present

## 2015-04-23 DIAGNOSIS — I872 Venous insufficiency (chronic) (peripheral): Secondary | ICD-10-CM | POA: Diagnosis not present

## 2015-04-26 DIAGNOSIS — I509 Heart failure, unspecified: Secondary | ICD-10-CM | POA: Diagnosis not present

## 2015-04-26 DIAGNOSIS — E669 Obesity, unspecified: Secondary | ICD-10-CM | POA: Diagnosis not present

## 2015-04-26 DIAGNOSIS — R69 Illness, unspecified: Secondary | ICD-10-CM | POA: Diagnosis not present

## 2015-04-26 DIAGNOSIS — E11620 Type 2 diabetes mellitus with diabetic dermatitis: Secondary | ICD-10-CM | POA: Diagnosis not present

## 2015-04-26 DIAGNOSIS — L97321 Non-pressure chronic ulcer of left ankle limited to breakdown of skin: Secondary | ICD-10-CM | POA: Diagnosis not present

## 2015-04-26 DIAGNOSIS — Z7984 Long term (current) use of oral hypoglycemic drugs: Secondary | ICD-10-CM | POA: Diagnosis not present

## 2015-04-26 DIAGNOSIS — I872 Venous insufficiency (chronic) (peripheral): Secondary | ICD-10-CM | POA: Diagnosis not present

## 2015-04-26 DIAGNOSIS — L97221 Non-pressure chronic ulcer of left calf limited to breakdown of skin: Secondary | ICD-10-CM | POA: Diagnosis not present

## 2015-04-28 DIAGNOSIS — I358 Other nonrheumatic aortic valve disorders: Secondary | ICD-10-CM | POA: Diagnosis not present

## 2015-04-28 DIAGNOSIS — R0602 Shortness of breath: Secondary | ICD-10-CM | POA: Diagnosis not present

## 2015-04-28 DIAGNOSIS — I340 Nonrheumatic mitral (valve) insufficiency: Secondary | ICD-10-CM | POA: Diagnosis not present

## 2015-04-28 DIAGNOSIS — R600 Localized edema: Secondary | ICD-10-CM | POA: Diagnosis not present

## 2015-04-28 DIAGNOSIS — I517 Cardiomegaly: Secondary | ICD-10-CM | POA: Diagnosis not present

## 2015-04-29 DIAGNOSIS — L97221 Non-pressure chronic ulcer of left calf limited to breakdown of skin: Secondary | ICD-10-CM | POA: Diagnosis not present

## 2015-04-29 DIAGNOSIS — L97321 Non-pressure chronic ulcer of left ankle limited to breakdown of skin: Secondary | ICD-10-CM | POA: Diagnosis not present

## 2015-04-29 DIAGNOSIS — E669 Obesity, unspecified: Secondary | ICD-10-CM | POA: Diagnosis not present

## 2015-04-29 DIAGNOSIS — I872 Venous insufficiency (chronic) (peripheral): Secondary | ICD-10-CM | POA: Diagnosis not present

## 2015-04-29 DIAGNOSIS — Z7984 Long term (current) use of oral hypoglycemic drugs: Secondary | ICD-10-CM | POA: Diagnosis not present

## 2015-04-29 DIAGNOSIS — R69 Illness, unspecified: Secondary | ICD-10-CM | POA: Diagnosis not present

## 2015-04-29 DIAGNOSIS — I509 Heart failure, unspecified: Secondary | ICD-10-CM | POA: Diagnosis not present

## 2015-04-29 DIAGNOSIS — E11620 Type 2 diabetes mellitus with diabetic dermatitis: Secondary | ICD-10-CM | POA: Diagnosis not present

## 2015-05-02 DIAGNOSIS — I872 Venous insufficiency (chronic) (peripheral): Secondary | ICD-10-CM | POA: Diagnosis not present

## 2015-05-02 DIAGNOSIS — Z7984 Long term (current) use of oral hypoglycemic drugs: Secondary | ICD-10-CM | POA: Diagnosis not present

## 2015-05-02 DIAGNOSIS — R69 Illness, unspecified: Secondary | ICD-10-CM | POA: Diagnosis not present

## 2015-05-02 DIAGNOSIS — E11620 Type 2 diabetes mellitus with diabetic dermatitis: Secondary | ICD-10-CM | POA: Diagnosis not present

## 2015-05-02 DIAGNOSIS — E669 Obesity, unspecified: Secondary | ICD-10-CM | POA: Diagnosis not present

## 2015-05-02 DIAGNOSIS — L97321 Non-pressure chronic ulcer of left ankle limited to breakdown of skin: Secondary | ICD-10-CM | POA: Diagnosis not present

## 2015-05-02 DIAGNOSIS — L97221 Non-pressure chronic ulcer of left calf limited to breakdown of skin: Secondary | ICD-10-CM | POA: Diagnosis not present

## 2015-05-02 DIAGNOSIS — I509 Heart failure, unspecified: Secondary | ICD-10-CM | POA: Diagnosis not present

## 2015-05-03 DIAGNOSIS — E119 Type 2 diabetes mellitus without complications: Secondary | ICD-10-CM | POA: Diagnosis not present

## 2015-05-03 DIAGNOSIS — E1142 Type 2 diabetes mellitus with diabetic polyneuropathy: Secondary | ICD-10-CM | POA: Diagnosis not present

## 2015-05-03 DIAGNOSIS — I10 Essential (primary) hypertension: Secondary | ICD-10-CM | POA: Diagnosis not present

## 2015-05-03 DIAGNOSIS — R262 Difficulty in walking, not elsewhere classified: Secondary | ICD-10-CM | POA: Diagnosis not present

## 2015-05-03 DIAGNOSIS — Z48817 Encounter for surgical aftercare following surgery on the skin and subcutaneous tissue: Secondary | ICD-10-CM | POA: Diagnosis not present

## 2015-05-03 DIAGNOSIS — L97529 Non-pressure chronic ulcer of other part of left foot with unspecified severity: Secondary | ICD-10-CM | POA: Diagnosis not present

## 2015-05-03 DIAGNOSIS — I509 Heart failure, unspecified: Secondary | ICD-10-CM | POA: Diagnosis not present

## 2015-05-03 DIAGNOSIS — E039 Hypothyroidism, unspecified: Secondary | ICD-10-CM | POA: Diagnosis not present

## 2015-05-03 DIAGNOSIS — M62838 Other muscle spasm: Secondary | ICD-10-CM | POA: Diagnosis not present

## 2015-05-03 DIAGNOSIS — L97222 Non-pressure chronic ulcer of left calf with fat layer exposed: Secondary | ICD-10-CM | POA: Diagnosis not present

## 2015-05-03 DIAGNOSIS — G2581 Restless legs syndrome: Secondary | ICD-10-CM | POA: Diagnosis not present

## 2015-05-03 DIAGNOSIS — Z0000 Encounter for general adult medical examination without abnormal findings: Secondary | ICD-10-CM | POA: Diagnosis not present

## 2015-05-03 DIAGNOSIS — S72001A Fracture of unspecified part of neck of right femur, initial encounter for closed fracture: Secondary | ICD-10-CM | POA: Diagnosis not present

## 2015-05-03 DIAGNOSIS — L03115 Cellulitis of right lower limb: Secondary | ICD-10-CM | POA: Diagnosis not present

## 2015-05-03 DIAGNOSIS — L03119 Cellulitis of unspecified part of limb: Secondary | ICD-10-CM | POA: Diagnosis not present

## 2015-05-03 DIAGNOSIS — M6281 Muscle weakness (generalized): Secondary | ICD-10-CM | POA: Diagnosis not present

## 2015-05-03 DIAGNOSIS — Z803 Family history of malignant neoplasm of breast: Secondary | ICD-10-CM | POA: Diagnosis not present

## 2015-05-03 DIAGNOSIS — I87312 Chronic venous hypertension (idiopathic) with ulcer of left lower extremity: Secondary | ICD-10-CM | POA: Diagnosis not present

## 2015-05-03 DIAGNOSIS — E1140 Type 2 diabetes mellitus with diabetic neuropathy, unspecified: Secondary | ICD-10-CM | POA: Diagnosis not present

## 2015-05-03 DIAGNOSIS — Z792 Long term (current) use of antibiotics: Secondary | ICD-10-CM | POA: Diagnosis not present

## 2015-05-03 DIAGNOSIS — L03116 Cellulitis of left lower limb: Secondary | ICD-10-CM | POA: Diagnosis not present

## 2015-05-03 DIAGNOSIS — M1990 Unspecified osteoarthritis, unspecified site: Secondary | ICD-10-CM | POA: Diagnosis not present

## 2015-05-03 DIAGNOSIS — I83028 Varicose veins of left lower extremity with ulcer other part of lower leg: Secondary | ICD-10-CM | POA: Diagnosis not present

## 2015-05-03 DIAGNOSIS — L97829 Non-pressure chronic ulcer of other part of left lower leg with unspecified severity: Secondary | ICD-10-CM | POA: Diagnosis not present

## 2015-05-03 DIAGNOSIS — I872 Venous insufficiency (chronic) (peripheral): Secondary | ICD-10-CM | POA: Diagnosis not present

## 2015-05-03 DIAGNOSIS — E669 Obesity, unspecified: Secondary | ICD-10-CM | POA: Diagnosis not present

## 2015-05-03 DIAGNOSIS — Z853 Personal history of malignant neoplasm of breast: Secondary | ICD-10-CM | POA: Diagnosis not present

## 2015-05-03 DIAGNOSIS — E11621 Type 2 diabetes mellitus with foot ulcer: Secondary | ICD-10-CM | POA: Diagnosis not present

## 2015-05-03 DIAGNOSIS — R0602 Shortness of breath: Secondary | ICD-10-CM | POA: Diagnosis not present

## 2015-05-03 DIAGNOSIS — G4733 Obstructive sleep apnea (adult) (pediatric): Secondary | ICD-10-CM | POA: Diagnosis not present

## 2015-05-03 DIAGNOSIS — Z6841 Body Mass Index (BMI) 40.0 and over, adult: Secondary | ICD-10-CM | POA: Diagnosis not present

## 2015-05-03 DIAGNOSIS — M7989 Other specified soft tissue disorders: Secondary | ICD-10-CM | POA: Diagnosis not present

## 2015-05-03 DIAGNOSIS — R600 Localized edema: Secondary | ICD-10-CM | POA: Diagnosis not present

## 2015-05-03 DIAGNOSIS — F339 Major depressive disorder, recurrent, unspecified: Secondary | ICD-10-CM | POA: Diagnosis not present

## 2015-05-03 DIAGNOSIS — L97929 Non-pressure chronic ulcer of unspecified part of left lower leg with unspecified severity: Secondary | ICD-10-CM | POA: Diagnosis not present

## 2015-05-03 DIAGNOSIS — E1165 Type 2 diabetes mellitus with hyperglycemia: Secondary | ICD-10-CM | POA: Diagnosis not present

## 2015-05-08 DIAGNOSIS — I10 Essential (primary) hypertension: Secondary | ICD-10-CM | POA: Diagnosis not present

## 2015-05-08 DIAGNOSIS — R600 Localized edema: Secondary | ICD-10-CM | POA: Diagnosis not present

## 2015-05-08 DIAGNOSIS — R0602 Shortness of breath: Secondary | ICD-10-CM | POA: Diagnosis not present

## 2015-05-08 DIAGNOSIS — G2581 Restless legs syndrome: Secondary | ICD-10-CM | POA: Diagnosis not present

## 2015-05-08 DIAGNOSIS — E1165 Type 2 diabetes mellitus with hyperglycemia: Secondary | ICD-10-CM | POA: Diagnosis not present

## 2015-05-09 DIAGNOSIS — E1140 Type 2 diabetes mellitus with diabetic neuropathy, unspecified: Secondary | ICD-10-CM | POA: Diagnosis not present

## 2015-05-09 DIAGNOSIS — L03119 Cellulitis of unspecified part of limb: Secondary | ICD-10-CM | POA: Diagnosis not present

## 2015-05-09 DIAGNOSIS — L03116 Cellulitis of left lower limb: Secondary | ICD-10-CM | POA: Diagnosis not present

## 2015-05-09 DIAGNOSIS — M62838 Other muscle spasm: Secondary | ICD-10-CM | POA: Diagnosis not present

## 2015-05-09 DIAGNOSIS — I10 Essential (primary) hypertension: Secondary | ICD-10-CM | POA: Diagnosis not present

## 2015-05-09 DIAGNOSIS — I509 Heart failure, unspecified: Secondary | ICD-10-CM | POA: Diagnosis not present

## 2015-05-09 DIAGNOSIS — M1990 Unspecified osteoarthritis, unspecified site: Secondary | ICD-10-CM | POA: Diagnosis not present

## 2015-05-09 DIAGNOSIS — G2581 Restless legs syndrome: Secondary | ICD-10-CM | POA: Diagnosis not present

## 2015-05-09 DIAGNOSIS — G4733 Obstructive sleep apnea (adult) (pediatric): Secondary | ICD-10-CM | POA: Diagnosis not present

## 2015-05-09 DIAGNOSIS — Z0000 Encounter for general adult medical examination without abnormal findings: Secondary | ICD-10-CM | POA: Diagnosis not present

## 2015-05-09 DIAGNOSIS — F339 Major depressive disorder, recurrent, unspecified: Secondary | ICD-10-CM | POA: Diagnosis not present

## 2015-05-09 DIAGNOSIS — E669 Obesity, unspecified: Secondary | ICD-10-CM | POA: Diagnosis not present

## 2015-05-09 DIAGNOSIS — M6281 Muscle weakness (generalized): Secondary | ICD-10-CM | POA: Diagnosis not present

## 2015-05-09 DIAGNOSIS — E039 Hypothyroidism, unspecified: Secondary | ICD-10-CM | POA: Diagnosis not present

## 2015-05-09 DIAGNOSIS — L03115 Cellulitis of right lower limb: Secondary | ICD-10-CM | POA: Diagnosis not present

## 2015-05-09 DIAGNOSIS — L97929 Non-pressure chronic ulcer of unspecified part of left lower leg with unspecified severity: Secondary | ICD-10-CM | POA: Diagnosis not present

## 2015-05-09 DIAGNOSIS — Z48817 Encounter for surgical aftercare following surgery on the skin and subcutaneous tissue: Secondary | ICD-10-CM | POA: Diagnosis not present

## 2015-05-09 DIAGNOSIS — R262 Difficulty in walking, not elsewhere classified: Secondary | ICD-10-CM | POA: Diagnosis not present

## 2015-05-11 DIAGNOSIS — L03119 Cellulitis of unspecified part of limb: Secondary | ICD-10-CM | POA: Diagnosis not present

## 2015-05-13 DIAGNOSIS — L03115 Cellulitis of right lower limb: Secondary | ICD-10-CM | POA: Diagnosis not present

## 2015-05-13 DIAGNOSIS — L03116 Cellulitis of left lower limb: Secondary | ICD-10-CM | POA: Diagnosis not present

## 2015-05-21 DIAGNOSIS — L97222 Non-pressure chronic ulcer of left calf with fat layer exposed: Secondary | ICD-10-CM | POA: Diagnosis not present

## 2015-05-21 DIAGNOSIS — L97212 Non-pressure chronic ulcer of right calf with fat layer exposed: Secondary | ICD-10-CM | POA: Diagnosis not present

## 2015-05-21 DIAGNOSIS — E11622 Type 2 diabetes mellitus with other skin ulcer: Secondary | ICD-10-CM | POA: Diagnosis not present

## 2015-05-21 DIAGNOSIS — Z7984 Long term (current) use of oral hypoglycemic drugs: Secondary | ICD-10-CM | POA: Diagnosis not present

## 2015-05-21 DIAGNOSIS — L03116 Cellulitis of left lower limb: Secondary | ICD-10-CM | POA: Diagnosis not present

## 2015-05-21 DIAGNOSIS — E1140 Type 2 diabetes mellitus with diabetic neuropathy, unspecified: Secondary | ICD-10-CM | POA: Diagnosis not present

## 2015-05-23 DIAGNOSIS — L97222 Non-pressure chronic ulcer of left calf with fat layer exposed: Secondary | ICD-10-CM | POA: Diagnosis not present

## 2015-05-23 DIAGNOSIS — E1140 Type 2 diabetes mellitus with diabetic neuropathy, unspecified: Secondary | ICD-10-CM | POA: Diagnosis not present

## 2015-05-23 DIAGNOSIS — Z7984 Long term (current) use of oral hypoglycemic drugs: Secondary | ICD-10-CM | POA: Diagnosis not present

## 2015-05-23 DIAGNOSIS — E11622 Type 2 diabetes mellitus with other skin ulcer: Secondary | ICD-10-CM | POA: Diagnosis not present

## 2015-05-23 DIAGNOSIS — L97122 Non-pressure chronic ulcer of left thigh with fat layer exposed: Secondary | ICD-10-CM | POA: Diagnosis not present

## 2015-05-23 DIAGNOSIS — L03116 Cellulitis of left lower limb: Secondary | ICD-10-CM | POA: Diagnosis not present

## 2015-05-23 DIAGNOSIS — L97212 Non-pressure chronic ulcer of right calf with fat layer exposed: Secondary | ICD-10-CM | POA: Diagnosis not present

## 2015-05-24 DIAGNOSIS — E1140 Type 2 diabetes mellitus with diabetic neuropathy, unspecified: Secondary | ICD-10-CM | POA: Diagnosis not present

## 2015-05-24 DIAGNOSIS — E1129 Type 2 diabetes mellitus with other diabetic kidney complication: Secondary | ICD-10-CM | POA: Diagnosis not present

## 2015-05-24 DIAGNOSIS — Z7984 Long term (current) use of oral hypoglycemic drugs: Secondary | ICD-10-CM | POA: Diagnosis not present

## 2015-05-24 DIAGNOSIS — E11622 Type 2 diabetes mellitus with other skin ulcer: Secondary | ICD-10-CM | POA: Diagnosis not present

## 2015-05-24 DIAGNOSIS — L97222 Non-pressure chronic ulcer of left calf with fat layer exposed: Secondary | ICD-10-CM | POA: Diagnosis not present

## 2015-05-24 DIAGNOSIS — E039 Hypothyroidism, unspecified: Secondary | ICD-10-CM | POA: Diagnosis not present

## 2015-05-24 DIAGNOSIS — E119 Type 2 diabetes mellitus without complications: Secondary | ICD-10-CM | POA: Diagnosis not present

## 2015-05-24 DIAGNOSIS — I10 Essential (primary) hypertension: Secondary | ICD-10-CM | POA: Diagnosis not present

## 2015-05-24 DIAGNOSIS — L97212 Non-pressure chronic ulcer of right calf with fat layer exposed: Secondary | ICD-10-CM | POA: Diagnosis not present

## 2015-05-24 DIAGNOSIS — L03116 Cellulitis of left lower limb: Secondary | ICD-10-CM | POA: Diagnosis not present

## 2015-05-24 DIAGNOSIS — R300 Dysuria: Secondary | ICD-10-CM | POA: Diagnosis not present

## 2015-05-26 DIAGNOSIS — L97222 Non-pressure chronic ulcer of left calf with fat layer exposed: Secondary | ICD-10-CM | POA: Diagnosis not present

## 2015-05-26 DIAGNOSIS — I10 Essential (primary) hypertension: Secondary | ICD-10-CM | POA: Diagnosis not present

## 2015-05-26 DIAGNOSIS — R609 Edema, unspecified: Secondary | ICD-10-CM | POA: Diagnosis not present

## 2015-05-26 DIAGNOSIS — N3000 Acute cystitis without hematuria: Secondary | ICD-10-CM | POA: Diagnosis not present

## 2015-05-26 DIAGNOSIS — L03116 Cellulitis of left lower limb: Secondary | ICD-10-CM | POA: Diagnosis not present

## 2015-05-26 DIAGNOSIS — E1129 Type 2 diabetes mellitus with other diabetic kidney complication: Secondary | ICD-10-CM | POA: Diagnosis not present

## 2015-05-26 DIAGNOSIS — E1165 Type 2 diabetes mellitus with hyperglycemia: Secondary | ICD-10-CM | POA: Diagnosis not present

## 2015-05-26 DIAGNOSIS — E1140 Type 2 diabetes mellitus with diabetic neuropathy, unspecified: Secondary | ICD-10-CM | POA: Diagnosis not present

## 2015-05-26 DIAGNOSIS — Z7984 Long term (current) use of oral hypoglycemic drugs: Secondary | ICD-10-CM | POA: Diagnosis not present

## 2015-05-26 DIAGNOSIS — L97212 Non-pressure chronic ulcer of right calf with fat layer exposed: Secondary | ICD-10-CM | POA: Diagnosis not present

## 2015-05-26 DIAGNOSIS — G2581 Restless legs syndrome: Secondary | ICD-10-CM | POA: Diagnosis not present

## 2015-05-26 DIAGNOSIS — E039 Hypothyroidism, unspecified: Secondary | ICD-10-CM | POA: Diagnosis not present

## 2015-05-26 DIAGNOSIS — E11622 Type 2 diabetes mellitus with other skin ulcer: Secondary | ICD-10-CM | POA: Diagnosis not present

## 2015-05-30 DIAGNOSIS — L97122 Non-pressure chronic ulcer of left thigh with fat layer exposed: Secondary | ICD-10-CM | POA: Diagnosis not present

## 2015-05-30 DIAGNOSIS — Z4889 Encounter for other specified surgical aftercare: Secondary | ICD-10-CM | POA: Diagnosis not present

## 2015-05-30 DIAGNOSIS — Z853 Personal history of malignant neoplasm of breast: Secondary | ICD-10-CM | POA: Diagnosis not present

## 2015-05-30 DIAGNOSIS — E119 Type 2 diabetes mellitus without complications: Secondary | ICD-10-CM | POA: Diagnosis not present

## 2015-05-30 DIAGNOSIS — L97222 Non-pressure chronic ulcer of left calf with fat layer exposed: Secondary | ICD-10-CM | POA: Diagnosis not present

## 2015-05-30 DIAGNOSIS — L03116 Cellulitis of left lower limb: Secondary | ICD-10-CM | POA: Diagnosis not present

## 2015-05-30 DIAGNOSIS — I872 Venous insufficiency (chronic) (peripheral): Secondary | ICD-10-CM | POA: Diagnosis not present

## 2015-05-30 DIAGNOSIS — Z792 Long term (current) use of antibiotics: Secondary | ICD-10-CM | POA: Diagnosis not present

## 2015-05-30 DIAGNOSIS — I10 Essential (primary) hypertension: Secondary | ICD-10-CM | POA: Diagnosis not present

## 2015-05-30 DIAGNOSIS — L97822 Non-pressure chronic ulcer of other part of left lower leg with fat layer exposed: Secondary | ICD-10-CM | POA: Diagnosis not present

## 2015-06-01 DIAGNOSIS — E11622 Type 2 diabetes mellitus with other skin ulcer: Secondary | ICD-10-CM | POA: Diagnosis not present

## 2015-06-01 DIAGNOSIS — Z01810 Encounter for preprocedural cardiovascular examination: Secondary | ICD-10-CM | POA: Diagnosis not present

## 2015-06-01 DIAGNOSIS — E039 Hypothyroidism, unspecified: Secondary | ICD-10-CM | POA: Diagnosis not present

## 2015-06-01 DIAGNOSIS — I509 Heart failure, unspecified: Secondary | ICD-10-CM | POA: Diagnosis not present

## 2015-06-01 DIAGNOSIS — G4733 Obstructive sleep apnea (adult) (pediatric): Secondary | ICD-10-CM | POA: Diagnosis not present

## 2015-06-01 DIAGNOSIS — L97222 Non-pressure chronic ulcer of left calf with fat layer exposed: Secondary | ICD-10-CM | POA: Diagnosis not present

## 2015-06-01 DIAGNOSIS — L03116 Cellulitis of left lower limb: Secondary | ICD-10-CM | POA: Diagnosis not present

## 2015-06-01 DIAGNOSIS — E1140 Type 2 diabetes mellitus with diabetic neuropathy, unspecified: Secondary | ICD-10-CM | POA: Diagnosis not present

## 2015-06-01 DIAGNOSIS — L97212 Non-pressure chronic ulcer of right calf with fat layer exposed: Secondary | ICD-10-CM | POA: Diagnosis not present

## 2015-06-01 DIAGNOSIS — E669 Obesity, unspecified: Secondary | ICD-10-CM | POA: Diagnosis not present

## 2015-06-01 DIAGNOSIS — I10 Essential (primary) hypertension: Secondary | ICD-10-CM | POA: Diagnosis not present

## 2015-06-01 DIAGNOSIS — L97122 Non-pressure chronic ulcer of left thigh with fat layer exposed: Secondary | ICD-10-CM | POA: Diagnosis not present

## 2015-06-01 DIAGNOSIS — G2581 Restless legs syndrome: Secondary | ICD-10-CM | POA: Diagnosis not present

## 2015-06-01 DIAGNOSIS — Z853 Personal history of malignant neoplasm of breast: Secondary | ICD-10-CM | POA: Diagnosis not present

## 2015-06-01 DIAGNOSIS — Z7984 Long term (current) use of oral hypoglycemic drugs: Secondary | ICD-10-CM | POA: Diagnosis not present

## 2015-06-02 DIAGNOSIS — E1140 Type 2 diabetes mellitus with diabetic neuropathy, unspecified: Secondary | ICD-10-CM | POA: Diagnosis not present

## 2015-06-02 DIAGNOSIS — L97222 Non-pressure chronic ulcer of left calf with fat layer exposed: Secondary | ICD-10-CM | POA: Diagnosis not present

## 2015-06-02 DIAGNOSIS — L03116 Cellulitis of left lower limb: Secondary | ICD-10-CM | POA: Diagnosis not present

## 2015-06-02 DIAGNOSIS — L97212 Non-pressure chronic ulcer of right calf with fat layer exposed: Secondary | ICD-10-CM | POA: Diagnosis not present

## 2015-06-02 DIAGNOSIS — Z7984 Long term (current) use of oral hypoglycemic drugs: Secondary | ICD-10-CM | POA: Diagnosis not present

## 2015-06-02 DIAGNOSIS — E11622 Type 2 diabetes mellitus with other skin ulcer: Secondary | ICD-10-CM | POA: Diagnosis not present

## 2015-06-03 DIAGNOSIS — L03116 Cellulitis of left lower limb: Secondary | ICD-10-CM | POA: Diagnosis not present

## 2015-06-03 DIAGNOSIS — L97222 Non-pressure chronic ulcer of left calf with fat layer exposed: Secondary | ICD-10-CM | POA: Diagnosis not present

## 2015-06-03 DIAGNOSIS — I872 Venous insufficiency (chronic) (peripheral): Secondary | ICD-10-CM | POA: Diagnosis not present

## 2015-06-03 DIAGNOSIS — L97122 Non-pressure chronic ulcer of left thigh with fat layer exposed: Secondary | ICD-10-CM | POA: Diagnosis not present

## 2015-06-03 DIAGNOSIS — S81802A Unspecified open wound, left lower leg, initial encounter: Secondary | ICD-10-CM | POA: Diagnosis not present

## 2015-06-03 DIAGNOSIS — E1140 Type 2 diabetes mellitus with diabetic neuropathy, unspecified: Secondary | ICD-10-CM | POA: Diagnosis not present

## 2015-06-03 DIAGNOSIS — I10 Essential (primary) hypertension: Secondary | ICD-10-CM | POA: Diagnosis not present

## 2015-06-04 DIAGNOSIS — I10 Essential (primary) hypertension: Secondary | ICD-10-CM | POA: Diagnosis not present

## 2015-06-04 DIAGNOSIS — E1140 Type 2 diabetes mellitus with diabetic neuropathy, unspecified: Secondary | ICD-10-CM | POA: Diagnosis not present

## 2015-06-04 DIAGNOSIS — L03116 Cellulitis of left lower limb: Secondary | ICD-10-CM | POA: Diagnosis not present

## 2015-06-04 DIAGNOSIS — S81802A Unspecified open wound, left lower leg, initial encounter: Secondary | ICD-10-CM | POA: Diagnosis not present

## 2015-06-05 DIAGNOSIS — L03116 Cellulitis of left lower limb: Secondary | ICD-10-CM | POA: Diagnosis not present

## 2015-06-05 DIAGNOSIS — I10 Essential (primary) hypertension: Secondary | ICD-10-CM | POA: Diagnosis not present

## 2015-06-05 DIAGNOSIS — S81802A Unspecified open wound, left lower leg, initial encounter: Secondary | ICD-10-CM | POA: Diagnosis not present

## 2015-06-05 DIAGNOSIS — E1140 Type 2 diabetes mellitus with diabetic neuropathy, unspecified: Secondary | ICD-10-CM | POA: Diagnosis not present

## 2015-06-06 DIAGNOSIS — L03116 Cellulitis of left lower limb: Secondary | ICD-10-CM | POA: Diagnosis not present

## 2015-06-06 DIAGNOSIS — E11622 Type 2 diabetes mellitus with other skin ulcer: Secondary | ICD-10-CM | POA: Diagnosis not present

## 2015-06-06 DIAGNOSIS — Z7984 Long term (current) use of oral hypoglycemic drugs: Secondary | ICD-10-CM | POA: Diagnosis not present

## 2015-06-06 DIAGNOSIS — L97212 Non-pressure chronic ulcer of right calf with fat layer exposed: Secondary | ICD-10-CM | POA: Diagnosis not present

## 2015-06-06 DIAGNOSIS — E1140 Type 2 diabetes mellitus with diabetic neuropathy, unspecified: Secondary | ICD-10-CM | POA: Diagnosis not present

## 2015-06-06 DIAGNOSIS — L97222 Non-pressure chronic ulcer of left calf with fat layer exposed: Secondary | ICD-10-CM | POA: Diagnosis not present

## 2015-06-07 DIAGNOSIS — Z7984 Long term (current) use of oral hypoglycemic drugs: Secondary | ICD-10-CM | POA: Diagnosis not present

## 2015-06-07 DIAGNOSIS — L03116 Cellulitis of left lower limb: Secondary | ICD-10-CM | POA: Diagnosis not present

## 2015-06-07 DIAGNOSIS — L97212 Non-pressure chronic ulcer of right calf with fat layer exposed: Secondary | ICD-10-CM | POA: Diagnosis not present

## 2015-06-07 DIAGNOSIS — E1140 Type 2 diabetes mellitus with diabetic neuropathy, unspecified: Secondary | ICD-10-CM | POA: Diagnosis not present

## 2015-06-07 DIAGNOSIS — L97222 Non-pressure chronic ulcer of left calf with fat layer exposed: Secondary | ICD-10-CM | POA: Diagnosis not present

## 2015-06-07 DIAGNOSIS — E11622 Type 2 diabetes mellitus with other skin ulcer: Secondary | ICD-10-CM | POA: Diagnosis not present

## 2015-06-09 DIAGNOSIS — Z7984 Long term (current) use of oral hypoglycemic drugs: Secondary | ICD-10-CM | POA: Diagnosis not present

## 2015-06-09 DIAGNOSIS — L03116 Cellulitis of left lower limb: Secondary | ICD-10-CM | POA: Diagnosis not present

## 2015-06-09 DIAGNOSIS — E1140 Type 2 diabetes mellitus with diabetic neuropathy, unspecified: Secondary | ICD-10-CM | POA: Diagnosis not present

## 2015-06-09 DIAGNOSIS — L97212 Non-pressure chronic ulcer of right calf with fat layer exposed: Secondary | ICD-10-CM | POA: Diagnosis not present

## 2015-06-09 DIAGNOSIS — E11622 Type 2 diabetes mellitus with other skin ulcer: Secondary | ICD-10-CM | POA: Diagnosis not present

## 2015-06-09 DIAGNOSIS — L97222 Non-pressure chronic ulcer of left calf with fat layer exposed: Secondary | ICD-10-CM | POA: Diagnosis not present

## 2015-06-14 DIAGNOSIS — L97212 Non-pressure chronic ulcer of right calf with fat layer exposed: Secondary | ICD-10-CM | POA: Diagnosis not present

## 2015-06-14 DIAGNOSIS — L03116 Cellulitis of left lower limb: Secondary | ICD-10-CM | POA: Diagnosis not present

## 2015-06-14 DIAGNOSIS — E1140 Type 2 diabetes mellitus with diabetic neuropathy, unspecified: Secondary | ICD-10-CM | POA: Diagnosis not present

## 2015-06-14 DIAGNOSIS — L97222 Non-pressure chronic ulcer of left calf with fat layer exposed: Secondary | ICD-10-CM | POA: Diagnosis not present

## 2015-06-14 DIAGNOSIS — E11622 Type 2 diabetes mellitus with other skin ulcer: Secondary | ICD-10-CM | POA: Diagnosis not present

## 2015-06-14 DIAGNOSIS — Z7984 Long term (current) use of oral hypoglycemic drugs: Secondary | ICD-10-CM | POA: Diagnosis not present

## 2015-06-16 DIAGNOSIS — E1140 Type 2 diabetes mellitus with diabetic neuropathy, unspecified: Secondary | ICD-10-CM | POA: Diagnosis not present

## 2015-06-16 DIAGNOSIS — L97212 Non-pressure chronic ulcer of right calf with fat layer exposed: Secondary | ICD-10-CM | POA: Diagnosis not present

## 2015-06-16 DIAGNOSIS — Z7984 Long term (current) use of oral hypoglycemic drugs: Secondary | ICD-10-CM | POA: Diagnosis not present

## 2015-06-16 DIAGNOSIS — L97222 Non-pressure chronic ulcer of left calf with fat layer exposed: Secondary | ICD-10-CM | POA: Diagnosis not present

## 2015-06-16 DIAGNOSIS — E11622 Type 2 diabetes mellitus with other skin ulcer: Secondary | ICD-10-CM | POA: Diagnosis not present

## 2015-06-16 DIAGNOSIS — L03116 Cellulitis of left lower limb: Secondary | ICD-10-CM | POA: Diagnosis not present

## 2015-06-17 DIAGNOSIS — E1140 Type 2 diabetes mellitus with diabetic neuropathy, unspecified: Secondary | ICD-10-CM | POA: Diagnosis not present

## 2015-06-17 DIAGNOSIS — L97222 Non-pressure chronic ulcer of left calf with fat layer exposed: Secondary | ICD-10-CM | POA: Diagnosis not present

## 2015-06-17 DIAGNOSIS — Z7984 Long term (current) use of oral hypoglycemic drugs: Secondary | ICD-10-CM | POA: Diagnosis not present

## 2015-06-17 DIAGNOSIS — L97212 Non-pressure chronic ulcer of right calf with fat layer exposed: Secondary | ICD-10-CM | POA: Diagnosis not present

## 2015-06-17 DIAGNOSIS — E11622 Type 2 diabetes mellitus with other skin ulcer: Secondary | ICD-10-CM | POA: Diagnosis not present

## 2015-06-17 DIAGNOSIS — L03116 Cellulitis of left lower limb: Secondary | ICD-10-CM | POA: Diagnosis not present

## 2015-06-21 DIAGNOSIS — Z7984 Long term (current) use of oral hypoglycemic drugs: Secondary | ICD-10-CM | POA: Diagnosis not present

## 2015-06-21 DIAGNOSIS — L97222 Non-pressure chronic ulcer of left calf with fat layer exposed: Secondary | ICD-10-CM | POA: Diagnosis not present

## 2015-06-21 DIAGNOSIS — E11622 Type 2 diabetes mellitus with other skin ulcer: Secondary | ICD-10-CM | POA: Diagnosis not present

## 2015-06-21 DIAGNOSIS — E1140 Type 2 diabetes mellitus with diabetic neuropathy, unspecified: Secondary | ICD-10-CM | POA: Diagnosis not present

## 2015-06-21 DIAGNOSIS — L97212 Non-pressure chronic ulcer of right calf with fat layer exposed: Secondary | ICD-10-CM | POA: Diagnosis not present

## 2015-06-21 DIAGNOSIS — L03116 Cellulitis of left lower limb: Secondary | ICD-10-CM | POA: Diagnosis not present

## 2015-06-23 DIAGNOSIS — E1140 Type 2 diabetes mellitus with diabetic neuropathy, unspecified: Secondary | ICD-10-CM | POA: Diagnosis not present

## 2015-06-23 DIAGNOSIS — L97212 Non-pressure chronic ulcer of right calf with fat layer exposed: Secondary | ICD-10-CM | POA: Diagnosis not present

## 2015-06-23 DIAGNOSIS — L03116 Cellulitis of left lower limb: Secondary | ICD-10-CM | POA: Diagnosis not present

## 2015-06-23 DIAGNOSIS — E11622 Type 2 diabetes mellitus with other skin ulcer: Secondary | ICD-10-CM | POA: Diagnosis not present

## 2015-06-23 DIAGNOSIS — L97222 Non-pressure chronic ulcer of left calf with fat layer exposed: Secondary | ICD-10-CM | POA: Diagnosis not present

## 2015-06-23 DIAGNOSIS — Z7984 Long term (current) use of oral hypoglycemic drugs: Secondary | ICD-10-CM | POA: Diagnosis not present

## 2015-06-24 DIAGNOSIS — E1140 Type 2 diabetes mellitus with diabetic neuropathy, unspecified: Secondary | ICD-10-CM | POA: Diagnosis not present

## 2015-06-24 DIAGNOSIS — L03116 Cellulitis of left lower limb: Secondary | ICD-10-CM | POA: Diagnosis not present

## 2015-06-24 DIAGNOSIS — L97222 Non-pressure chronic ulcer of left calf with fat layer exposed: Secondary | ICD-10-CM | POA: Diagnosis not present

## 2015-06-24 DIAGNOSIS — E11622 Type 2 diabetes mellitus with other skin ulcer: Secondary | ICD-10-CM | POA: Diagnosis not present

## 2015-06-24 DIAGNOSIS — Z7984 Long term (current) use of oral hypoglycemic drugs: Secondary | ICD-10-CM | POA: Diagnosis not present

## 2015-06-24 DIAGNOSIS — L97212 Non-pressure chronic ulcer of right calf with fat layer exposed: Secondary | ICD-10-CM | POA: Diagnosis not present

## 2015-06-28 DIAGNOSIS — L97222 Non-pressure chronic ulcer of left calf with fat layer exposed: Secondary | ICD-10-CM | POA: Diagnosis not present

## 2015-06-28 DIAGNOSIS — Z7984 Long term (current) use of oral hypoglycemic drugs: Secondary | ICD-10-CM | POA: Diagnosis not present

## 2015-06-28 DIAGNOSIS — E1140 Type 2 diabetes mellitus with diabetic neuropathy, unspecified: Secondary | ICD-10-CM | POA: Diagnosis not present

## 2015-06-28 DIAGNOSIS — L03116 Cellulitis of left lower limb: Secondary | ICD-10-CM | POA: Diagnosis not present

## 2015-06-28 DIAGNOSIS — L97212 Non-pressure chronic ulcer of right calf with fat layer exposed: Secondary | ICD-10-CM | POA: Diagnosis not present

## 2015-06-28 DIAGNOSIS — E11622 Type 2 diabetes mellitus with other skin ulcer: Secondary | ICD-10-CM | POA: Diagnosis not present

## 2015-07-01 DIAGNOSIS — L97222 Non-pressure chronic ulcer of left calf with fat layer exposed: Secondary | ICD-10-CM | POA: Diagnosis not present

## 2015-07-01 DIAGNOSIS — Z7984 Long term (current) use of oral hypoglycemic drugs: Secondary | ICD-10-CM | POA: Diagnosis not present

## 2015-07-01 DIAGNOSIS — E1140 Type 2 diabetes mellitus with diabetic neuropathy, unspecified: Secondary | ICD-10-CM | POA: Diagnosis not present

## 2015-07-01 DIAGNOSIS — E11622 Type 2 diabetes mellitus with other skin ulcer: Secondary | ICD-10-CM | POA: Diagnosis not present

## 2015-07-01 DIAGNOSIS — L97212 Non-pressure chronic ulcer of right calf with fat layer exposed: Secondary | ICD-10-CM | POA: Diagnosis not present

## 2015-07-01 DIAGNOSIS — L03116 Cellulitis of left lower limb: Secondary | ICD-10-CM | POA: Diagnosis not present

## 2015-07-07 DIAGNOSIS — L03116 Cellulitis of left lower limb: Secondary | ICD-10-CM | POA: Diagnosis not present

## 2015-07-07 DIAGNOSIS — L97222 Non-pressure chronic ulcer of left calf with fat layer exposed: Secondary | ICD-10-CM | POA: Diagnosis not present

## 2015-07-07 DIAGNOSIS — Z853 Personal history of malignant neoplasm of breast: Secondary | ICD-10-CM | POA: Diagnosis not present

## 2015-07-07 DIAGNOSIS — I10 Essential (primary) hypertension: Secondary | ICD-10-CM | POA: Diagnosis not present

## 2015-07-07 DIAGNOSIS — E119 Type 2 diabetes mellitus without complications: Secondary | ICD-10-CM | POA: Diagnosis not present

## 2015-07-07 DIAGNOSIS — Z792 Long term (current) use of antibiotics: Secondary | ICD-10-CM | POA: Diagnosis not present

## 2015-07-07 DIAGNOSIS — S71112A Laceration without foreign body, left thigh, initial encounter: Secondary | ICD-10-CM | POA: Diagnosis not present

## 2015-07-18 DIAGNOSIS — R69 Illness, unspecified: Secondary | ICD-10-CM | POA: Diagnosis not present

## 2015-07-19 DIAGNOSIS — R69 Illness, unspecified: Secondary | ICD-10-CM | POA: Diagnosis not present

## 2015-07-20 DIAGNOSIS — L97229 Non-pressure chronic ulcer of left calf with unspecified severity: Secondary | ICD-10-CM | POA: Diagnosis not present

## 2015-07-22 DIAGNOSIS — L97229 Non-pressure chronic ulcer of left calf with unspecified severity: Secondary | ICD-10-CM | POA: Diagnosis not present

## 2015-08-04 DIAGNOSIS — Z853 Personal history of malignant neoplasm of breast: Secondary | ICD-10-CM | POA: Diagnosis not present

## 2015-08-04 DIAGNOSIS — E669 Obesity, unspecified: Secondary | ICD-10-CM | POA: Diagnosis not present

## 2015-08-04 DIAGNOSIS — I10 Essential (primary) hypertension: Secondary | ICD-10-CM | POA: Diagnosis not present

## 2015-08-04 DIAGNOSIS — L97822 Non-pressure chronic ulcer of other part of left lower leg with fat layer exposed: Secondary | ICD-10-CM | POA: Diagnosis not present

## 2015-08-04 DIAGNOSIS — I872 Venous insufficiency (chronic) (peripheral): Secondary | ICD-10-CM | POA: Diagnosis not present

## 2015-08-04 DIAGNOSIS — E119 Type 2 diabetes mellitus without complications: Secondary | ICD-10-CM | POA: Diagnosis not present

## 2015-08-16 DIAGNOSIS — L97229 Non-pressure chronic ulcer of left calf with unspecified severity: Secondary | ICD-10-CM | POA: Diagnosis not present

## 2015-08-29 DIAGNOSIS — L97229 Non-pressure chronic ulcer of left calf with unspecified severity: Secondary | ICD-10-CM | POA: Diagnosis not present

## 2015-09-01 DIAGNOSIS — I83028 Varicose veins of left lower extremity with ulcer other part of lower leg: Secondary | ICD-10-CM | POA: Diagnosis not present

## 2015-09-01 DIAGNOSIS — Z01818 Encounter for other preprocedural examination: Secondary | ICD-10-CM | POA: Diagnosis not present

## 2015-09-01 DIAGNOSIS — N3941 Urge incontinence: Secondary | ICD-10-CM | POA: Diagnosis not present

## 2015-09-01 DIAGNOSIS — Z4889 Encounter for other specified surgical aftercare: Secondary | ICD-10-CM | POA: Diagnosis not present

## 2015-09-01 DIAGNOSIS — L03115 Cellulitis of right lower limb: Secondary | ICD-10-CM | POA: Diagnosis not present

## 2015-09-01 DIAGNOSIS — S81802A Unspecified open wound, left lower leg, initial encounter: Secondary | ICD-10-CM | POA: Diagnosis not present

## 2015-09-01 DIAGNOSIS — G2581 Restless legs syndrome: Secondary | ICD-10-CM | POA: Diagnosis not present

## 2015-09-01 DIAGNOSIS — Z01810 Encounter for preprocedural cardiovascular examination: Secondary | ICD-10-CM | POA: Diagnosis not present

## 2015-09-01 DIAGNOSIS — I872 Venous insufficiency (chronic) (peripheral): Secondary | ICD-10-CM | POA: Diagnosis not present

## 2015-09-01 DIAGNOSIS — E1140 Type 2 diabetes mellitus with diabetic neuropathy, unspecified: Secondary | ICD-10-CM | POA: Diagnosis not present

## 2015-09-01 DIAGNOSIS — I87391 Chronic venous hypertension (idiopathic) with other complications of right lower extremity: Secondary | ICD-10-CM | POA: Diagnosis not present

## 2015-09-01 DIAGNOSIS — F339 Major depressive disorder, recurrent, unspecified: Secondary | ICD-10-CM | POA: Diagnosis not present

## 2015-09-01 DIAGNOSIS — L97929 Non-pressure chronic ulcer of unspecified part of left lower leg with unspecified severity: Secondary | ICD-10-CM | POA: Diagnosis not present

## 2015-09-01 DIAGNOSIS — L03116 Cellulitis of left lower limb: Secondary | ICD-10-CM | POA: Diagnosis not present

## 2015-09-01 DIAGNOSIS — L97222 Non-pressure chronic ulcer of left calf with fat layer exposed: Secondary | ICD-10-CM | POA: Diagnosis not present

## 2015-09-01 DIAGNOSIS — Z48817 Encounter for surgical aftercare following surgery on the skin and subcutaneous tissue: Secondary | ICD-10-CM | POA: Diagnosis not present

## 2015-09-01 DIAGNOSIS — L97829 Non-pressure chronic ulcer of other part of left lower leg with unspecified severity: Secondary | ICD-10-CM | POA: Diagnosis not present

## 2015-09-01 DIAGNOSIS — I87332 Chronic venous hypertension (idiopathic) with ulcer and inflammation of left lower extremity: Secondary | ICD-10-CM | POA: Diagnosis not present

## 2015-09-01 DIAGNOSIS — M62838 Other muscle spasm: Secondary | ICD-10-CM | POA: Diagnosis not present

## 2015-09-01 DIAGNOSIS — M1990 Unspecified osteoarthritis, unspecified site: Secondary | ICD-10-CM | POA: Diagnosis not present

## 2015-09-01 DIAGNOSIS — Z6841 Body Mass Index (BMI) 40.0 and over, adult: Secondary | ICD-10-CM | POA: Diagnosis not present

## 2015-09-01 DIAGNOSIS — R262 Difficulty in walking, not elsewhere classified: Secondary | ICD-10-CM | POA: Diagnosis not present

## 2015-09-01 DIAGNOSIS — I87331 Chronic venous hypertension (idiopathic) with ulcer and inflammation of right lower extremity: Secondary | ICD-10-CM | POA: Diagnosis not present

## 2015-09-01 DIAGNOSIS — G4733 Obstructive sleep apnea (adult) (pediatric): Secondary | ICD-10-CM | POA: Diagnosis not present

## 2015-09-01 DIAGNOSIS — E669 Obesity, unspecified: Secondary | ICD-10-CM | POA: Diagnosis not present

## 2015-09-01 DIAGNOSIS — I509 Heart failure, unspecified: Secondary | ICD-10-CM | POA: Diagnosis not present

## 2015-09-01 DIAGNOSIS — E119 Type 2 diabetes mellitus without complications: Secondary | ICD-10-CM | POA: Diagnosis not present

## 2015-09-01 DIAGNOSIS — M6281 Muscle weakness (generalized): Secondary | ICD-10-CM | POA: Diagnosis not present

## 2015-09-01 DIAGNOSIS — E8770 Fluid overload, unspecified: Secondary | ICD-10-CM | POA: Diagnosis not present

## 2015-09-01 DIAGNOSIS — I10 Essential (primary) hypertension: Secondary | ICD-10-CM | POA: Diagnosis not present

## 2015-09-01 DIAGNOSIS — L97922 Non-pressure chronic ulcer of unspecified part of left lower leg with fat layer exposed: Secondary | ICD-10-CM | POA: Diagnosis not present

## 2015-09-01 DIAGNOSIS — E039 Hypothyroidism, unspecified: Secondary | ICD-10-CM | POA: Diagnosis not present

## 2015-09-05 DIAGNOSIS — L97222 Non-pressure chronic ulcer of left calf with fat layer exposed: Secondary | ICD-10-CM | POA: Diagnosis not present

## 2015-09-05 DIAGNOSIS — L97922 Non-pressure chronic ulcer of unspecified part of left lower leg with fat layer exposed: Secondary | ICD-10-CM | POA: Diagnosis not present

## 2015-09-09 DIAGNOSIS — E1140 Type 2 diabetes mellitus with diabetic neuropathy, unspecified: Secondary | ICD-10-CM | POA: Diagnosis not present

## 2015-09-09 DIAGNOSIS — E119 Type 2 diabetes mellitus without complications: Secondary | ICD-10-CM | POA: Diagnosis not present

## 2015-09-09 DIAGNOSIS — M1990 Unspecified osteoarthritis, unspecified site: Secondary | ICD-10-CM | POA: Diagnosis not present

## 2015-09-09 DIAGNOSIS — I509 Heart failure, unspecified: Secondary | ICD-10-CM | POA: Diagnosis not present

## 2015-09-09 DIAGNOSIS — M6281 Muscle weakness (generalized): Secondary | ICD-10-CM | POA: Diagnosis not present

## 2015-09-09 DIAGNOSIS — I878 Other specified disorders of veins: Secondary | ICD-10-CM | POA: Diagnosis not present

## 2015-09-09 DIAGNOSIS — L97929 Non-pressure chronic ulcer of unspecified part of left lower leg with unspecified severity: Secondary | ICD-10-CM | POA: Diagnosis not present

## 2015-09-09 DIAGNOSIS — I87332 Chronic venous hypertension (idiopathic) with ulcer and inflammation of left lower extremity: Secondary | ICD-10-CM | POA: Diagnosis not present

## 2015-09-09 DIAGNOSIS — E039 Hypothyroidism, unspecified: Secondary | ICD-10-CM | POA: Diagnosis not present

## 2015-09-09 DIAGNOSIS — G4733 Obstructive sleep apnea (adult) (pediatric): Secondary | ICD-10-CM | POA: Diagnosis not present

## 2015-09-09 DIAGNOSIS — L03115 Cellulitis of right lower limb: Secondary | ICD-10-CM | POA: Diagnosis not present

## 2015-09-09 DIAGNOSIS — L03116 Cellulitis of left lower limb: Secondary | ICD-10-CM | POA: Diagnosis not present

## 2015-09-09 DIAGNOSIS — F339 Major depressive disorder, recurrent, unspecified: Secondary | ICD-10-CM | POA: Diagnosis not present

## 2015-09-09 DIAGNOSIS — Z48817 Encounter for surgical aftercare following surgery on the skin and subcutaneous tissue: Secondary | ICD-10-CM | POA: Diagnosis not present

## 2015-09-09 DIAGNOSIS — M62838 Other muscle spasm: Secondary | ICD-10-CM | POA: Diagnosis not present

## 2015-09-09 DIAGNOSIS — L97821 Non-pressure chronic ulcer of other part of left lower leg limited to breakdown of skin: Secondary | ICD-10-CM | POA: Diagnosis not present

## 2015-09-09 DIAGNOSIS — I10 Essential (primary) hypertension: Secondary | ICD-10-CM | POA: Diagnosis not present

## 2015-09-09 DIAGNOSIS — I87331 Chronic venous hypertension (idiopathic) with ulcer and inflammation of right lower extremity: Secondary | ICD-10-CM | POA: Diagnosis not present

## 2015-09-09 DIAGNOSIS — R262 Difficulty in walking, not elsewhere classified: Secondary | ICD-10-CM | POA: Diagnosis not present

## 2015-09-09 DIAGNOSIS — Z9889 Other specified postprocedural states: Secondary | ICD-10-CM | POA: Diagnosis not present

## 2015-09-09 DIAGNOSIS — L97829 Non-pressure chronic ulcer of other part of left lower leg with unspecified severity: Secondary | ICD-10-CM | POA: Diagnosis not present

## 2015-09-09 DIAGNOSIS — G2581 Restless legs syndrome: Secondary | ICD-10-CM | POA: Diagnosis not present

## 2015-09-09 DIAGNOSIS — E669 Obesity, unspecified: Secondary | ICD-10-CM | POA: Diagnosis not present

## 2015-09-09 DIAGNOSIS — I87391 Chronic venous hypertension (idiopathic) with other complications of right lower extremity: Secondary | ICD-10-CM | POA: Diagnosis not present

## 2015-09-12 DIAGNOSIS — E119 Type 2 diabetes mellitus without complications: Secondary | ICD-10-CM | POA: Diagnosis not present

## 2015-09-12 DIAGNOSIS — I10 Essential (primary) hypertension: Secondary | ICD-10-CM | POA: Diagnosis not present

## 2015-09-12 DIAGNOSIS — L97829 Non-pressure chronic ulcer of other part of left lower leg with unspecified severity: Secondary | ICD-10-CM | POA: Diagnosis not present

## 2015-09-12 DIAGNOSIS — I878 Other specified disorders of veins: Secondary | ICD-10-CM | POA: Diagnosis not present

## 2015-09-12 DIAGNOSIS — I87332 Chronic venous hypertension (idiopathic) with ulcer and inflammation of left lower extremity: Secondary | ICD-10-CM | POA: Diagnosis not present

## 2015-09-12 DIAGNOSIS — Z9889 Other specified postprocedural states: Secondary | ICD-10-CM | POA: Diagnosis not present

## 2015-09-12 DIAGNOSIS — I87331 Chronic venous hypertension (idiopathic) with ulcer and inflammation of right lower extremity: Secondary | ICD-10-CM | POA: Diagnosis not present

## 2015-09-13 DIAGNOSIS — E119 Type 2 diabetes mellitus without complications: Secondary | ICD-10-CM | POA: Diagnosis not present

## 2015-09-13 DIAGNOSIS — I87391 Chronic venous hypertension (idiopathic) with other complications of right lower extremity: Secondary | ICD-10-CM | POA: Diagnosis not present

## 2015-09-13 DIAGNOSIS — I87332 Chronic venous hypertension (idiopathic) with ulcer and inflammation of left lower extremity: Secondary | ICD-10-CM | POA: Diagnosis not present

## 2015-09-13 DIAGNOSIS — I10 Essential (primary) hypertension: Secondary | ICD-10-CM | POA: Diagnosis not present

## 2015-09-15 DIAGNOSIS — Z9889 Other specified postprocedural states: Secondary | ICD-10-CM | POA: Diagnosis not present

## 2015-09-15 DIAGNOSIS — I878 Other specified disorders of veins: Secondary | ICD-10-CM | POA: Diagnosis not present

## 2015-09-15 DIAGNOSIS — L97829 Non-pressure chronic ulcer of other part of left lower leg with unspecified severity: Secondary | ICD-10-CM | POA: Diagnosis not present

## 2015-09-20 DIAGNOSIS — L97829 Non-pressure chronic ulcer of other part of left lower leg with unspecified severity: Secondary | ICD-10-CM | POA: Diagnosis not present

## 2015-09-20 DIAGNOSIS — I878 Other specified disorders of veins: Secondary | ICD-10-CM | POA: Diagnosis not present

## 2015-09-20 DIAGNOSIS — Z9889 Other specified postprocedural states: Secondary | ICD-10-CM | POA: Diagnosis not present

## 2015-09-27 DIAGNOSIS — L97829 Non-pressure chronic ulcer of other part of left lower leg with unspecified severity: Secondary | ICD-10-CM | POA: Diagnosis not present

## 2015-09-27 DIAGNOSIS — I878 Other specified disorders of veins: Secondary | ICD-10-CM | POA: Diagnosis not present

## 2015-09-27 DIAGNOSIS — Z9889 Other specified postprocedural states: Secondary | ICD-10-CM | POA: Diagnosis not present

## 2015-10-05 DIAGNOSIS — L97821 Non-pressure chronic ulcer of other part of left lower leg limited to breakdown of skin: Secondary | ICD-10-CM | POA: Diagnosis not present

## 2015-10-05 DIAGNOSIS — I878 Other specified disorders of veins: Secondary | ICD-10-CM | POA: Diagnosis not present

## 2015-10-05 DIAGNOSIS — Z9889 Other specified postprocedural states: Secondary | ICD-10-CM | POA: Diagnosis not present

## 2015-10-11 DIAGNOSIS — R69 Illness, unspecified: Secondary | ICD-10-CM | POA: Diagnosis not present

## 2015-10-11 DIAGNOSIS — Z7984 Long term (current) use of oral hypoglycemic drugs: Secondary | ICD-10-CM | POA: Diagnosis not present

## 2015-10-11 DIAGNOSIS — E119 Type 2 diabetes mellitus without complications: Secondary | ICD-10-CM | POA: Diagnosis not present

## 2015-10-11 DIAGNOSIS — Z48817 Encounter for surgical aftercare following surgery on the skin and subcutaneous tissue: Secondary | ICD-10-CM | POA: Diagnosis not present

## 2015-10-11 DIAGNOSIS — I890 Lymphedema, not elsewhere classified: Secondary | ICD-10-CM | POA: Diagnosis not present

## 2015-10-11 DIAGNOSIS — I10 Essential (primary) hypertension: Secondary | ICD-10-CM | POA: Diagnosis not present

## 2015-10-11 DIAGNOSIS — L03115 Cellulitis of right lower limb: Secondary | ICD-10-CM | POA: Diagnosis not present

## 2015-10-11 DIAGNOSIS — L03116 Cellulitis of left lower limb: Secondary | ICD-10-CM | POA: Diagnosis not present

## 2015-10-12 DIAGNOSIS — L97821 Non-pressure chronic ulcer of other part of left lower leg limited to breakdown of skin: Secondary | ICD-10-CM | POA: Diagnosis not present

## 2015-10-12 DIAGNOSIS — Z48817 Encounter for surgical aftercare following surgery on the skin and subcutaneous tissue: Secondary | ICD-10-CM | POA: Diagnosis not present

## 2015-10-12 DIAGNOSIS — Z9889 Other specified postprocedural states: Secondary | ICD-10-CM | POA: Diagnosis not present

## 2015-10-12 DIAGNOSIS — I878 Other specified disorders of veins: Secondary | ICD-10-CM | POA: Diagnosis not present

## 2015-10-13 DIAGNOSIS — R69 Illness, unspecified: Secondary | ICD-10-CM | POA: Diagnosis not present

## 2015-10-14 DIAGNOSIS — Z48817 Encounter for surgical aftercare following surgery on the skin and subcutaneous tissue: Secondary | ICD-10-CM | POA: Diagnosis not present

## 2015-10-17 DIAGNOSIS — Z48817 Encounter for surgical aftercare following surgery on the skin and subcutaneous tissue: Secondary | ICD-10-CM | POA: Diagnosis not present

## 2015-10-18 DIAGNOSIS — Z48817 Encounter for surgical aftercare following surgery on the skin and subcutaneous tissue: Secondary | ICD-10-CM | POA: Diagnosis not present

## 2015-10-19 DIAGNOSIS — Z48817 Encounter for surgical aftercare following surgery on the skin and subcutaneous tissue: Secondary | ICD-10-CM | POA: Diagnosis not present

## 2015-10-19 DIAGNOSIS — Z853 Personal history of malignant neoplasm of breast: Secondary | ICD-10-CM | POA: Diagnosis not present

## 2015-10-19 DIAGNOSIS — R928 Other abnormal and inconclusive findings on diagnostic imaging of breast: Secondary | ICD-10-CM | POA: Diagnosis not present

## 2015-10-19 DIAGNOSIS — Z9889 Other specified postprocedural states: Secondary | ICD-10-CM | POA: Diagnosis not present

## 2015-10-20 DIAGNOSIS — Z48817 Encounter for surgical aftercare following surgery on the skin and subcutaneous tissue: Secondary | ICD-10-CM | POA: Diagnosis not present

## 2015-10-24 DIAGNOSIS — Z48817 Encounter for surgical aftercare following surgery on the skin and subcutaneous tissue: Secondary | ICD-10-CM | POA: Diagnosis not present

## 2015-10-25 DIAGNOSIS — Z48817 Encounter for surgical aftercare following surgery on the skin and subcutaneous tissue: Secondary | ICD-10-CM | POA: Diagnosis not present

## 2015-10-26 DIAGNOSIS — Z48817 Encounter for surgical aftercare following surgery on the skin and subcutaneous tissue: Secondary | ICD-10-CM | POA: Diagnosis not present

## 2015-10-31 DIAGNOSIS — Z48817 Encounter for surgical aftercare following surgery on the skin and subcutaneous tissue: Secondary | ICD-10-CM | POA: Diagnosis not present

## 2015-11-01 DIAGNOSIS — Z48817 Encounter for surgical aftercare following surgery on the skin and subcutaneous tissue: Secondary | ICD-10-CM | POA: Diagnosis not present

## 2015-11-02 DIAGNOSIS — Z48817 Encounter for surgical aftercare following surgery on the skin and subcutaneous tissue: Secondary | ICD-10-CM | POA: Diagnosis not present

## 2015-11-08 DIAGNOSIS — Z48817 Encounter for surgical aftercare following surgery on the skin and subcutaneous tissue: Secondary | ICD-10-CM | POA: Diagnosis not present

## 2015-11-08 DIAGNOSIS — I878 Other specified disorders of veins: Secondary | ICD-10-CM | POA: Diagnosis not present

## 2015-11-08 DIAGNOSIS — L97222 Non-pressure chronic ulcer of left calf with fat layer exposed: Secondary | ICD-10-CM | POA: Diagnosis not present

## 2015-11-09 DIAGNOSIS — Z48817 Encounter for surgical aftercare following surgery on the skin and subcutaneous tissue: Secondary | ICD-10-CM | POA: Diagnosis not present

## 2015-11-14 DIAGNOSIS — G2581 Restless legs syndrome: Secondary | ICD-10-CM | POA: Diagnosis not present

## 2015-11-14 DIAGNOSIS — N3000 Acute cystitis without hematuria: Secondary | ICD-10-CM | POA: Diagnosis not present

## 2015-11-14 DIAGNOSIS — E039 Hypothyroidism, unspecified: Secondary | ICD-10-CM | POA: Diagnosis not present

## 2015-11-14 DIAGNOSIS — R609 Edema, unspecified: Secondary | ICD-10-CM | POA: Diagnosis not present

## 2015-11-14 DIAGNOSIS — I10 Essential (primary) hypertension: Secondary | ICD-10-CM | POA: Diagnosis not present

## 2015-11-14 DIAGNOSIS — E1165 Type 2 diabetes mellitus with hyperglycemia: Secondary | ICD-10-CM | POA: Diagnosis not present

## 2015-11-14 DIAGNOSIS — R51 Headache: Secondary | ICD-10-CM | POA: Diagnosis not present

## 2015-11-14 DIAGNOSIS — E1129 Type 2 diabetes mellitus with other diabetic kidney complication: Secondary | ICD-10-CM | POA: Diagnosis not present

## 2015-11-16 DIAGNOSIS — I878 Other specified disorders of veins: Secondary | ICD-10-CM | POA: Diagnosis not present

## 2015-11-16 DIAGNOSIS — L97222 Non-pressure chronic ulcer of left calf with fat layer exposed: Secondary | ICD-10-CM | POA: Diagnosis not present

## 2015-11-18 DIAGNOSIS — Z48817 Encounter for surgical aftercare following surgery on the skin and subcutaneous tissue: Secondary | ICD-10-CM | POA: Diagnosis not present

## 2015-11-25 DIAGNOSIS — Z48817 Encounter for surgical aftercare following surgery on the skin and subcutaneous tissue: Secondary | ICD-10-CM | POA: Diagnosis not present

## 2015-11-28 DIAGNOSIS — Z48817 Encounter for surgical aftercare following surgery on the skin and subcutaneous tissue: Secondary | ICD-10-CM | POA: Diagnosis not present

## 2015-11-30 DIAGNOSIS — I878 Other specified disorders of veins: Secondary | ICD-10-CM | POA: Diagnosis not present

## 2015-11-30 DIAGNOSIS — L97222 Non-pressure chronic ulcer of left calf with fat layer exposed: Secondary | ICD-10-CM | POA: Diagnosis not present

## 2015-12-02 DIAGNOSIS — Z48817 Encounter for surgical aftercare following surgery on the skin and subcutaneous tissue: Secondary | ICD-10-CM | POA: Diagnosis not present

## 2015-12-05 DIAGNOSIS — R69 Illness, unspecified: Secondary | ICD-10-CM | POA: Diagnosis not present

## 2015-12-06 DIAGNOSIS — Z48817 Encounter for surgical aftercare following surgery on the skin and subcutaneous tissue: Secondary | ICD-10-CM | POA: Diagnosis not present

## 2015-12-08 DIAGNOSIS — I872 Venous insufficiency (chronic) (peripheral): Secondary | ICD-10-CM | POA: Diagnosis not present

## 2015-12-08 DIAGNOSIS — L97221 Non-pressure chronic ulcer of left calf limited to breakdown of skin: Secondary | ICD-10-CM | POA: Diagnosis not present

## 2015-12-08 DIAGNOSIS — I878 Other specified disorders of veins: Secondary | ICD-10-CM | POA: Diagnosis not present

## 2015-12-08 DIAGNOSIS — L97222 Non-pressure chronic ulcer of left calf with fat layer exposed: Secondary | ICD-10-CM | POA: Diagnosis not present

## 2015-12-10 DIAGNOSIS — Z48817 Encounter for surgical aftercare following surgery on the skin and subcutaneous tissue: Secondary | ICD-10-CM | POA: Diagnosis not present

## 2015-12-15 DIAGNOSIS — R69 Illness, unspecified: Secondary | ICD-10-CM | POA: Diagnosis not present

## 2015-12-15 DIAGNOSIS — E039 Hypothyroidism, unspecified: Secondary | ICD-10-CM | POA: Diagnosis not present

## 2015-12-15 DIAGNOSIS — I10 Essential (primary) hypertension: Secondary | ICD-10-CM | POA: Diagnosis not present

## 2015-12-15 DIAGNOSIS — Z0000 Encounter for general adult medical examination without abnormal findings: Secondary | ICD-10-CM | POA: Diagnosis not present

## 2015-12-15 DIAGNOSIS — E1140 Type 2 diabetes mellitus with diabetic neuropathy, unspecified: Secondary | ICD-10-CM | POA: Diagnosis not present

## 2015-12-16 DIAGNOSIS — N390 Urinary tract infection, site not specified: Secondary | ICD-10-CM | POA: Diagnosis not present

## 2016-01-05 DIAGNOSIS — I872 Venous insufficiency (chronic) (peripheral): Secondary | ICD-10-CM | POA: Diagnosis not present

## 2016-01-05 DIAGNOSIS — L97222 Non-pressure chronic ulcer of left calf with fat layer exposed: Secondary | ICD-10-CM | POA: Diagnosis not present

## 2016-01-05 DIAGNOSIS — I878 Other specified disorders of veins: Secondary | ICD-10-CM | POA: Diagnosis not present

## 2016-01-09 DIAGNOSIS — L97222 Non-pressure chronic ulcer of left calf with fat layer exposed: Secondary | ICD-10-CM | POA: Diagnosis not present

## 2016-01-09 DIAGNOSIS — I872 Venous insufficiency (chronic) (peripheral): Secondary | ICD-10-CM | POA: Diagnosis not present

## 2016-01-09 DIAGNOSIS — L97211 Non-pressure chronic ulcer of right calf limited to breakdown of skin: Secondary | ICD-10-CM | POA: Diagnosis not present

## 2016-01-10 DIAGNOSIS — I10 Essential (primary) hypertension: Secondary | ICD-10-CM | POA: Diagnosis not present

## 2016-01-10 DIAGNOSIS — E1165 Type 2 diabetes mellitus with hyperglycemia: Secondary | ICD-10-CM | POA: Diagnosis not present

## 2016-01-10 DIAGNOSIS — D649 Anemia, unspecified: Secondary | ICD-10-CM | POA: Diagnosis not present

## 2016-01-10 DIAGNOSIS — E039 Hypothyroidism, unspecified: Secondary | ICD-10-CM | POA: Diagnosis not present

## 2016-01-10 DIAGNOSIS — E781 Pure hyperglyceridemia: Secondary | ICD-10-CM | POA: Diagnosis not present

## 2016-01-12 DIAGNOSIS — R609 Edema, unspecified: Secondary | ICD-10-CM | POA: Diagnosis not present

## 2016-01-12 DIAGNOSIS — R51 Headache: Secondary | ICD-10-CM | POA: Diagnosis not present

## 2016-01-12 DIAGNOSIS — Z1389 Encounter for screening for other disorder: Secondary | ICD-10-CM | POA: Diagnosis not present

## 2016-01-12 DIAGNOSIS — N3000 Acute cystitis without hematuria: Secondary | ICD-10-CM | POA: Diagnosis not present

## 2016-01-12 DIAGNOSIS — I10 Essential (primary) hypertension: Secondary | ICD-10-CM | POA: Diagnosis not present

## 2016-01-12 DIAGNOSIS — E1129 Type 2 diabetes mellitus with other diabetic kidney complication: Secondary | ICD-10-CM | POA: Diagnosis not present

## 2016-01-12 DIAGNOSIS — E039 Hypothyroidism, unspecified: Secondary | ICD-10-CM | POA: Diagnosis not present

## 2016-01-12 DIAGNOSIS — Z6841 Body Mass Index (BMI) 40.0 and over, adult: Secondary | ICD-10-CM | POA: Diagnosis not present

## 2016-01-12 DIAGNOSIS — E1165 Type 2 diabetes mellitus with hyperglycemia: Secondary | ICD-10-CM | POA: Diagnosis not present

## 2016-01-12 DIAGNOSIS — G2581 Restless legs syndrome: Secondary | ICD-10-CM | POA: Diagnosis not present

## 2016-01-27 DIAGNOSIS — G2581 Restless legs syndrome: Secondary | ICD-10-CM | POA: Diagnosis not present

## 2016-01-27 DIAGNOSIS — E785 Hyperlipidemia, unspecified: Secondary | ICD-10-CM | POA: Diagnosis not present

## 2016-01-27 DIAGNOSIS — L538 Other specified erythematous conditions: Secondary | ICD-10-CM | POA: Diagnosis not present

## 2016-01-27 DIAGNOSIS — G4733 Obstructive sleep apnea (adult) (pediatric): Secondary | ICD-10-CM | POA: Diagnosis not present

## 2016-01-27 DIAGNOSIS — L97329 Non-pressure chronic ulcer of left ankle with unspecified severity: Secondary | ICD-10-CM | POA: Diagnosis not present

## 2016-01-27 DIAGNOSIS — F329 Major depressive disorder, single episode, unspecified: Secondary | ICD-10-CM | POA: Diagnosis not present

## 2016-01-27 DIAGNOSIS — E876 Hypokalemia: Secondary | ICD-10-CM | POA: Diagnosis not present

## 2016-01-27 DIAGNOSIS — M6281 Muscle weakness (generalized): Secondary | ICD-10-CM | POA: Diagnosis not present

## 2016-01-27 DIAGNOSIS — L03116 Cellulitis of left lower limb: Secondary | ICD-10-CM | POA: Diagnosis not present

## 2016-01-27 DIAGNOSIS — E669 Obesity, unspecified: Secondary | ICD-10-CM | POA: Diagnosis not present

## 2016-01-27 DIAGNOSIS — R609 Edema, unspecified: Secondary | ICD-10-CM | POA: Diagnosis not present

## 2016-01-27 DIAGNOSIS — R262 Difficulty in walking, not elsewhere classified: Secondary | ICD-10-CM | POA: Diagnosis not present

## 2016-01-27 DIAGNOSIS — L97222 Non-pressure chronic ulcer of left calf with fat layer exposed: Secondary | ICD-10-CM | POA: Diagnosis not present

## 2016-01-27 DIAGNOSIS — B961 Klebsiella pneumoniae [K. pneumoniae] as the cause of diseases classified elsewhere: Secondary | ICD-10-CM | POA: Diagnosis not present

## 2016-01-27 DIAGNOSIS — I872 Venous insufficiency (chronic) (peripheral): Secondary | ICD-10-CM | POA: Diagnosis not present

## 2016-01-27 DIAGNOSIS — I890 Lymphedema, not elsewhere classified: Secondary | ICD-10-CM | POA: Diagnosis not present

## 2016-01-27 DIAGNOSIS — F419 Anxiety disorder, unspecified: Secondary | ICD-10-CM | POA: Diagnosis not present

## 2016-01-27 DIAGNOSIS — I10 Essential (primary) hypertension: Secondary | ICD-10-CM | POA: Diagnosis not present

## 2016-01-27 DIAGNOSIS — R54 Age-related physical debility: Secondary | ICD-10-CM | POA: Diagnosis not present

## 2016-01-27 DIAGNOSIS — I83023 Varicose veins of left lower extremity with ulcer of ankle: Secondary | ICD-10-CM | POA: Diagnosis not present

## 2016-01-27 DIAGNOSIS — B9689 Other specified bacterial agents as the cause of diseases classified elsewhere: Secondary | ICD-10-CM | POA: Diagnosis not present

## 2016-01-27 DIAGNOSIS — E119 Type 2 diabetes mellitus without complications: Secondary | ICD-10-CM | POA: Diagnosis not present

## 2016-01-27 DIAGNOSIS — I2510 Atherosclerotic heart disease of native coronary artery without angina pectoris: Secondary | ICD-10-CM | POA: Diagnosis not present

## 2016-01-27 DIAGNOSIS — L97829 Non-pressure chronic ulcer of other part of left lower leg with unspecified severity: Secondary | ICD-10-CM | POA: Diagnosis not present

## 2016-01-27 DIAGNOSIS — E039 Hypothyroidism, unspecified: Secondary | ICD-10-CM | POA: Diagnosis not present

## 2016-01-27 DIAGNOSIS — R5381 Other malaise: Secondary | ICD-10-CM | POA: Diagnosis not present

## 2016-01-27 DIAGNOSIS — G4700 Insomnia, unspecified: Secondary | ICD-10-CM | POA: Diagnosis not present

## 2016-01-27 DIAGNOSIS — K5900 Constipation, unspecified: Secondary | ICD-10-CM | POA: Diagnosis not present

## 2016-01-27 DIAGNOSIS — Z6841 Body Mass Index (BMI) 40.0 and over, adult: Secondary | ICD-10-CM | POA: Diagnosis not present

## 2016-01-31 DIAGNOSIS — R69 Illness, unspecified: Secondary | ICD-10-CM | POA: Diagnosis not present

## 2016-01-31 DIAGNOSIS — I83019 Varicose veins of right lower extremity with ulcer of unspecified site: Secondary | ICD-10-CM | POA: Diagnosis not present

## 2016-01-31 DIAGNOSIS — K5900 Constipation, unspecified: Secondary | ICD-10-CM | POA: Diagnosis not present

## 2016-01-31 DIAGNOSIS — I83023 Varicose veins of left lower extremity with ulcer of ankle: Secondary | ICD-10-CM | POA: Diagnosis not present

## 2016-01-31 DIAGNOSIS — I872 Venous insufficiency (chronic) (peripheral): Secondary | ICD-10-CM | POA: Diagnosis not present

## 2016-01-31 DIAGNOSIS — E119 Type 2 diabetes mellitus without complications: Secondary | ICD-10-CM | POA: Diagnosis not present

## 2016-01-31 DIAGNOSIS — G4700 Insomnia, unspecified: Secondary | ICD-10-CM | POA: Diagnosis not present

## 2016-01-31 DIAGNOSIS — R609 Edema, unspecified: Secondary | ICD-10-CM | POA: Diagnosis not present

## 2016-01-31 DIAGNOSIS — I2510 Atherosclerotic heart disease of native coronary artery without angina pectoris: Secondary | ICD-10-CM | POA: Diagnosis not present

## 2016-01-31 DIAGNOSIS — Z452 Encounter for adjustment and management of vascular access device: Secondary | ICD-10-CM | POA: Diagnosis not present

## 2016-01-31 DIAGNOSIS — I890 Lymphedema, not elsewhere classified: Secondary | ICD-10-CM | POA: Diagnosis not present

## 2016-01-31 DIAGNOSIS — E669 Obesity, unspecified: Secondary | ICD-10-CM | POA: Diagnosis not present

## 2016-01-31 DIAGNOSIS — R54 Age-related physical debility: Secondary | ICD-10-CM | POA: Diagnosis not present

## 2016-01-31 DIAGNOSIS — E039 Hypothyroidism, unspecified: Secondary | ICD-10-CM | POA: Diagnosis not present

## 2016-01-31 DIAGNOSIS — R2689 Other abnormalities of gait and mobility: Secondary | ICD-10-CM | POA: Diagnosis not present

## 2016-01-31 DIAGNOSIS — R0989 Other specified symptoms and signs involving the circulatory and respiratory systems: Secondary | ICD-10-CM | POA: Diagnosis not present

## 2016-01-31 DIAGNOSIS — F419 Anxiety disorder, unspecified: Secondary | ICD-10-CM | POA: Diagnosis not present

## 2016-01-31 DIAGNOSIS — E876 Hypokalemia: Secondary | ICD-10-CM | POA: Diagnosis not present

## 2016-01-31 DIAGNOSIS — F329 Major depressive disorder, single episode, unspecified: Secondary | ICD-10-CM | POA: Diagnosis not present

## 2016-01-31 DIAGNOSIS — E11620 Type 2 diabetes mellitus with diabetic dermatitis: Secondary | ICD-10-CM | POA: Diagnosis not present

## 2016-01-31 DIAGNOSIS — L03116 Cellulitis of left lower limb: Secondary | ICD-10-CM | POA: Diagnosis not present

## 2016-01-31 DIAGNOSIS — R262 Difficulty in walking, not elsewhere classified: Secondary | ICD-10-CM | POA: Diagnosis not present

## 2016-01-31 DIAGNOSIS — E785 Hyperlipidemia, unspecified: Secondary | ICD-10-CM | POA: Diagnosis not present

## 2016-01-31 DIAGNOSIS — G4733 Obstructive sleep apnea (adult) (pediatric): Secondary | ICD-10-CM | POA: Diagnosis not present

## 2016-01-31 DIAGNOSIS — I10 Essential (primary) hypertension: Secondary | ICD-10-CM | POA: Diagnosis not present

## 2016-01-31 DIAGNOSIS — Z743 Need for continuous supervision: Secondary | ICD-10-CM | POA: Diagnosis not present

## 2016-01-31 DIAGNOSIS — I499 Cardiac arrhythmia, unspecified: Secondary | ICD-10-CM | POA: Diagnosis not present

## 2016-01-31 DIAGNOSIS — G2581 Restless legs syndrome: Secondary | ICD-10-CM | POA: Diagnosis not present

## 2016-01-31 DIAGNOSIS — R5381 Other malaise: Secondary | ICD-10-CM | POA: Diagnosis not present

## 2016-01-31 DIAGNOSIS — L97829 Non-pressure chronic ulcer of other part of left lower leg with unspecified severity: Secondary | ICD-10-CM | POA: Diagnosis not present

## 2016-01-31 DIAGNOSIS — I83029 Varicose veins of left lower extremity with ulcer of unspecified site: Secondary | ICD-10-CM | POA: Diagnosis not present

## 2016-01-31 DIAGNOSIS — M6281 Muscle weakness (generalized): Secondary | ICD-10-CM | POA: Diagnosis not present

## 2016-02-01 DIAGNOSIS — E039 Hypothyroidism, unspecified: Secondary | ICD-10-CM | POA: Diagnosis not present

## 2016-02-01 DIAGNOSIS — E669 Obesity, unspecified: Secondary | ICD-10-CM | POA: Diagnosis not present

## 2016-02-01 DIAGNOSIS — R69 Illness, unspecified: Secondary | ICD-10-CM | POA: Diagnosis not present

## 2016-02-01 DIAGNOSIS — G4733 Obstructive sleep apnea (adult) (pediatric): Secondary | ICD-10-CM | POA: Diagnosis not present

## 2016-02-01 DIAGNOSIS — E11620 Type 2 diabetes mellitus with diabetic dermatitis: Secondary | ICD-10-CM | POA: Diagnosis not present

## 2016-02-01 DIAGNOSIS — I83029 Varicose veins of left lower extremity with ulcer of unspecified site: Secondary | ICD-10-CM | POA: Diagnosis not present

## 2016-02-01 DIAGNOSIS — R5381 Other malaise: Secondary | ICD-10-CM | POA: Diagnosis not present

## 2016-02-01 DIAGNOSIS — I83019 Varicose veins of right lower extremity with ulcer of unspecified site: Secondary | ICD-10-CM | POA: Diagnosis not present

## 2016-02-01 DIAGNOSIS — G2581 Restless legs syndrome: Secondary | ICD-10-CM | POA: Diagnosis not present

## 2016-02-06 DIAGNOSIS — I499 Cardiac arrhythmia, unspecified: Secondary | ICD-10-CM | POA: Diagnosis not present

## 2016-02-16 DIAGNOSIS — E119 Type 2 diabetes mellitus without complications: Secondary | ICD-10-CM | POA: Diagnosis not present

## 2016-02-16 DIAGNOSIS — L97222 Non-pressure chronic ulcer of left calf with fat layer exposed: Secondary | ICD-10-CM | POA: Diagnosis not present

## 2016-02-16 DIAGNOSIS — I10 Essential (primary) hypertension: Secondary | ICD-10-CM | POA: Diagnosis not present

## 2016-02-16 DIAGNOSIS — Z7982 Long term (current) use of aspirin: Secondary | ICD-10-CM | POA: Diagnosis not present

## 2016-02-16 DIAGNOSIS — R69 Illness, unspecified: Secondary | ICD-10-CM | POA: Diagnosis not present

## 2016-02-16 DIAGNOSIS — I890 Lymphedema, not elsewhere classified: Secondary | ICD-10-CM | POA: Diagnosis not present

## 2016-02-16 DIAGNOSIS — Z7984 Long term (current) use of oral hypoglycemic drugs: Secondary | ICD-10-CM | POA: Diagnosis not present

## 2016-02-16 DIAGNOSIS — Z79891 Long term (current) use of opiate analgesic: Secondary | ICD-10-CM | POA: Diagnosis not present

## 2016-02-16 DIAGNOSIS — I872 Venous insufficiency (chronic) (peripheral): Secondary | ICD-10-CM | POA: Diagnosis not present

## 2016-02-17 DIAGNOSIS — Z7984 Long term (current) use of oral hypoglycemic drugs: Secondary | ICD-10-CM | POA: Diagnosis not present

## 2016-02-17 DIAGNOSIS — R69 Illness, unspecified: Secondary | ICD-10-CM | POA: Diagnosis not present

## 2016-02-17 DIAGNOSIS — I890 Lymphedema, not elsewhere classified: Secondary | ICD-10-CM | POA: Diagnosis not present

## 2016-02-17 DIAGNOSIS — L97222 Non-pressure chronic ulcer of left calf with fat layer exposed: Secondary | ICD-10-CM | POA: Diagnosis not present

## 2016-02-17 DIAGNOSIS — Z7982 Long term (current) use of aspirin: Secondary | ICD-10-CM | POA: Diagnosis not present

## 2016-02-17 DIAGNOSIS — I872 Venous insufficiency (chronic) (peripheral): Secondary | ICD-10-CM | POA: Diagnosis not present

## 2016-02-17 DIAGNOSIS — Z79891 Long term (current) use of opiate analgesic: Secondary | ICD-10-CM | POA: Diagnosis not present

## 2016-02-17 DIAGNOSIS — E119 Type 2 diabetes mellitus without complications: Secondary | ICD-10-CM | POA: Diagnosis not present

## 2016-02-17 DIAGNOSIS — I10 Essential (primary) hypertension: Secondary | ICD-10-CM | POA: Diagnosis not present

## 2016-02-20 DIAGNOSIS — I890 Lymphedema, not elsewhere classified: Secondary | ICD-10-CM | POA: Diagnosis not present

## 2016-02-20 DIAGNOSIS — L97222 Non-pressure chronic ulcer of left calf with fat layer exposed: Secondary | ICD-10-CM | POA: Diagnosis not present

## 2016-02-20 DIAGNOSIS — I10 Essential (primary) hypertension: Secondary | ICD-10-CM | POA: Diagnosis not present

## 2016-02-20 DIAGNOSIS — E119 Type 2 diabetes mellitus without complications: Secondary | ICD-10-CM | POA: Diagnosis not present

## 2016-02-20 DIAGNOSIS — Z7984 Long term (current) use of oral hypoglycemic drugs: Secondary | ICD-10-CM | POA: Diagnosis not present

## 2016-02-20 DIAGNOSIS — Z79891 Long term (current) use of opiate analgesic: Secondary | ICD-10-CM | POA: Diagnosis not present

## 2016-02-20 DIAGNOSIS — Z7982 Long term (current) use of aspirin: Secondary | ICD-10-CM | POA: Diagnosis not present

## 2016-02-20 DIAGNOSIS — I872 Venous insufficiency (chronic) (peripheral): Secondary | ICD-10-CM | POA: Diagnosis not present

## 2016-02-20 DIAGNOSIS — R69 Illness, unspecified: Secondary | ICD-10-CM | POA: Diagnosis not present

## 2016-02-21 DIAGNOSIS — L97222 Non-pressure chronic ulcer of left calf with fat layer exposed: Secondary | ICD-10-CM | POA: Diagnosis not present

## 2016-02-21 DIAGNOSIS — L98492 Non-pressure chronic ulcer of skin of other sites with fat layer exposed: Secondary | ICD-10-CM | POA: Diagnosis not present

## 2016-02-21 DIAGNOSIS — I87312 Chronic venous hypertension (idiopathic) with ulcer of left lower extremity: Secondary | ICD-10-CM | POA: Diagnosis not present

## 2016-02-21 DIAGNOSIS — I878 Other specified disorders of veins: Secondary | ICD-10-CM | POA: Diagnosis not present

## 2016-02-22 DIAGNOSIS — I10 Essential (primary) hypertension: Secondary | ICD-10-CM | POA: Diagnosis not present

## 2016-02-22 DIAGNOSIS — Z7982 Long term (current) use of aspirin: Secondary | ICD-10-CM | POA: Diagnosis not present

## 2016-02-22 DIAGNOSIS — I872 Venous insufficiency (chronic) (peripheral): Secondary | ICD-10-CM | POA: Diagnosis not present

## 2016-02-22 DIAGNOSIS — Z7984 Long term (current) use of oral hypoglycemic drugs: Secondary | ICD-10-CM | POA: Diagnosis not present

## 2016-02-22 DIAGNOSIS — I890 Lymphedema, not elsewhere classified: Secondary | ICD-10-CM | POA: Diagnosis not present

## 2016-02-22 DIAGNOSIS — R69 Illness, unspecified: Secondary | ICD-10-CM | POA: Diagnosis not present

## 2016-02-22 DIAGNOSIS — Z79891 Long term (current) use of opiate analgesic: Secondary | ICD-10-CM | POA: Diagnosis not present

## 2016-02-22 DIAGNOSIS — L97222 Non-pressure chronic ulcer of left calf with fat layer exposed: Secondary | ICD-10-CM | POA: Diagnosis not present

## 2016-02-22 DIAGNOSIS — E119 Type 2 diabetes mellitus without complications: Secondary | ICD-10-CM | POA: Diagnosis not present

## 2016-02-23 DIAGNOSIS — R69 Illness, unspecified: Secondary | ICD-10-CM | POA: Diagnosis not present

## 2016-02-23 DIAGNOSIS — Z79891 Long term (current) use of opiate analgesic: Secondary | ICD-10-CM | POA: Diagnosis not present

## 2016-02-23 DIAGNOSIS — I872 Venous insufficiency (chronic) (peripheral): Secondary | ICD-10-CM | POA: Diagnosis not present

## 2016-02-23 DIAGNOSIS — E119 Type 2 diabetes mellitus without complications: Secondary | ICD-10-CM | POA: Diagnosis not present

## 2016-02-23 DIAGNOSIS — Z7982 Long term (current) use of aspirin: Secondary | ICD-10-CM | POA: Diagnosis not present

## 2016-02-23 DIAGNOSIS — L97222 Non-pressure chronic ulcer of left calf with fat layer exposed: Secondary | ICD-10-CM | POA: Diagnosis not present

## 2016-02-23 DIAGNOSIS — Z7984 Long term (current) use of oral hypoglycemic drugs: Secondary | ICD-10-CM | POA: Diagnosis not present

## 2016-02-23 DIAGNOSIS — I10 Essential (primary) hypertension: Secondary | ICD-10-CM | POA: Diagnosis not present

## 2016-02-23 DIAGNOSIS — I890 Lymphedema, not elsewhere classified: Secondary | ICD-10-CM | POA: Diagnosis not present

## 2016-02-24 DIAGNOSIS — E119 Type 2 diabetes mellitus without complications: Secondary | ICD-10-CM | POA: Diagnosis not present

## 2016-02-24 DIAGNOSIS — I10 Essential (primary) hypertension: Secondary | ICD-10-CM | POA: Diagnosis not present

## 2016-02-24 DIAGNOSIS — Z79891 Long term (current) use of opiate analgesic: Secondary | ICD-10-CM | POA: Diagnosis not present

## 2016-02-24 DIAGNOSIS — I872 Venous insufficiency (chronic) (peripheral): Secondary | ICD-10-CM | POA: Diagnosis not present

## 2016-02-24 DIAGNOSIS — L97222 Non-pressure chronic ulcer of left calf with fat layer exposed: Secondary | ICD-10-CM | POA: Diagnosis not present

## 2016-02-24 DIAGNOSIS — Z7984 Long term (current) use of oral hypoglycemic drugs: Secondary | ICD-10-CM | POA: Diagnosis not present

## 2016-02-24 DIAGNOSIS — Z7982 Long term (current) use of aspirin: Secondary | ICD-10-CM | POA: Diagnosis not present

## 2016-02-24 DIAGNOSIS — I890 Lymphedema, not elsewhere classified: Secondary | ICD-10-CM | POA: Diagnosis not present

## 2016-02-24 DIAGNOSIS — R69 Illness, unspecified: Secondary | ICD-10-CM | POA: Diagnosis not present

## 2016-02-27 DIAGNOSIS — I10 Essential (primary) hypertension: Secondary | ICD-10-CM | POA: Diagnosis not present

## 2016-02-27 DIAGNOSIS — Z79891 Long term (current) use of opiate analgesic: Secondary | ICD-10-CM | POA: Diagnosis not present

## 2016-02-27 DIAGNOSIS — E119 Type 2 diabetes mellitus without complications: Secondary | ICD-10-CM | POA: Diagnosis not present

## 2016-02-27 DIAGNOSIS — I872 Venous insufficiency (chronic) (peripheral): Secondary | ICD-10-CM | POA: Diagnosis not present

## 2016-02-27 DIAGNOSIS — L97222 Non-pressure chronic ulcer of left calf with fat layer exposed: Secondary | ICD-10-CM | POA: Diagnosis not present

## 2016-02-27 DIAGNOSIS — I890 Lymphedema, not elsewhere classified: Secondary | ICD-10-CM | POA: Diagnosis not present

## 2016-02-27 DIAGNOSIS — Z7982 Long term (current) use of aspirin: Secondary | ICD-10-CM | POA: Diagnosis not present

## 2016-02-27 DIAGNOSIS — R69 Illness, unspecified: Secondary | ICD-10-CM | POA: Diagnosis not present

## 2016-02-27 DIAGNOSIS — Z7984 Long term (current) use of oral hypoglycemic drugs: Secondary | ICD-10-CM | POA: Diagnosis not present

## 2016-02-28 DIAGNOSIS — Z79891 Long term (current) use of opiate analgesic: Secondary | ICD-10-CM | POA: Diagnosis not present

## 2016-02-28 DIAGNOSIS — Z7982 Long term (current) use of aspirin: Secondary | ICD-10-CM | POA: Diagnosis not present

## 2016-02-28 DIAGNOSIS — L97222 Non-pressure chronic ulcer of left calf with fat layer exposed: Secondary | ICD-10-CM | POA: Diagnosis not present

## 2016-02-28 DIAGNOSIS — Z7984 Long term (current) use of oral hypoglycemic drugs: Secondary | ICD-10-CM | POA: Diagnosis not present

## 2016-02-28 DIAGNOSIS — E119 Type 2 diabetes mellitus without complications: Secondary | ICD-10-CM | POA: Diagnosis not present

## 2016-02-28 DIAGNOSIS — R69 Illness, unspecified: Secondary | ICD-10-CM | POA: Diagnosis not present

## 2016-02-28 DIAGNOSIS — I10 Essential (primary) hypertension: Secondary | ICD-10-CM | POA: Diagnosis not present

## 2016-02-28 DIAGNOSIS — I890 Lymphedema, not elsewhere classified: Secondary | ICD-10-CM | POA: Diagnosis not present

## 2016-02-28 DIAGNOSIS — I872 Venous insufficiency (chronic) (peripheral): Secondary | ICD-10-CM | POA: Diagnosis not present

## 2016-03-01 DIAGNOSIS — I890 Lymphedema, not elsewhere classified: Secondary | ICD-10-CM | POA: Diagnosis not present

## 2016-03-01 DIAGNOSIS — E119 Type 2 diabetes mellitus without complications: Secondary | ICD-10-CM | POA: Diagnosis not present

## 2016-03-01 DIAGNOSIS — Z7984 Long term (current) use of oral hypoglycemic drugs: Secondary | ICD-10-CM | POA: Diagnosis not present

## 2016-03-01 DIAGNOSIS — Z79891 Long term (current) use of opiate analgesic: Secondary | ICD-10-CM | POA: Diagnosis not present

## 2016-03-01 DIAGNOSIS — R69 Illness, unspecified: Secondary | ICD-10-CM | POA: Diagnosis not present

## 2016-03-01 DIAGNOSIS — I872 Venous insufficiency (chronic) (peripheral): Secondary | ICD-10-CM | POA: Diagnosis not present

## 2016-03-01 DIAGNOSIS — I10 Essential (primary) hypertension: Secondary | ICD-10-CM | POA: Diagnosis not present

## 2016-03-01 DIAGNOSIS — Z7982 Long term (current) use of aspirin: Secondary | ICD-10-CM | POA: Diagnosis not present

## 2016-03-01 DIAGNOSIS — L97222 Non-pressure chronic ulcer of left calf with fat layer exposed: Secondary | ICD-10-CM | POA: Diagnosis not present

## 2016-03-02 DIAGNOSIS — Z7984 Long term (current) use of oral hypoglycemic drugs: Secondary | ICD-10-CM | POA: Diagnosis not present

## 2016-03-02 DIAGNOSIS — Z79891 Long term (current) use of opiate analgesic: Secondary | ICD-10-CM | POA: Diagnosis not present

## 2016-03-02 DIAGNOSIS — Z7982 Long term (current) use of aspirin: Secondary | ICD-10-CM | POA: Diagnosis not present

## 2016-03-02 DIAGNOSIS — I872 Venous insufficiency (chronic) (peripheral): Secondary | ICD-10-CM | POA: Diagnosis not present

## 2016-03-02 DIAGNOSIS — I10 Essential (primary) hypertension: Secondary | ICD-10-CM | POA: Diagnosis not present

## 2016-03-02 DIAGNOSIS — L97222 Non-pressure chronic ulcer of left calf with fat layer exposed: Secondary | ICD-10-CM | POA: Diagnosis not present

## 2016-03-02 DIAGNOSIS — I890 Lymphedema, not elsewhere classified: Secondary | ICD-10-CM | POA: Diagnosis not present

## 2016-03-02 DIAGNOSIS — R69 Illness, unspecified: Secondary | ICD-10-CM | POA: Diagnosis not present

## 2016-03-02 DIAGNOSIS — E119 Type 2 diabetes mellitus without complications: Secondary | ICD-10-CM | POA: Diagnosis not present

## 2016-03-05 DIAGNOSIS — I890 Lymphedema, not elsewhere classified: Secondary | ICD-10-CM | POA: Diagnosis not present

## 2016-03-05 DIAGNOSIS — E119 Type 2 diabetes mellitus without complications: Secondary | ICD-10-CM | POA: Diagnosis not present

## 2016-03-05 DIAGNOSIS — R69 Illness, unspecified: Secondary | ICD-10-CM | POA: Diagnosis not present

## 2016-03-05 DIAGNOSIS — L97222 Non-pressure chronic ulcer of left calf with fat layer exposed: Secondary | ICD-10-CM | POA: Diagnosis not present

## 2016-03-05 DIAGNOSIS — Z79891 Long term (current) use of opiate analgesic: Secondary | ICD-10-CM | POA: Diagnosis not present

## 2016-03-05 DIAGNOSIS — Z7982 Long term (current) use of aspirin: Secondary | ICD-10-CM | POA: Diagnosis not present

## 2016-03-05 DIAGNOSIS — I10 Essential (primary) hypertension: Secondary | ICD-10-CM | POA: Diagnosis not present

## 2016-03-05 DIAGNOSIS — I872 Venous insufficiency (chronic) (peripheral): Secondary | ICD-10-CM | POA: Diagnosis not present

## 2016-03-05 DIAGNOSIS — Z7984 Long term (current) use of oral hypoglycemic drugs: Secondary | ICD-10-CM | POA: Diagnosis not present

## 2016-03-06 DIAGNOSIS — I890 Lymphedema, not elsewhere classified: Secondary | ICD-10-CM | POA: Diagnosis not present

## 2016-03-06 DIAGNOSIS — I872 Venous insufficiency (chronic) (peripheral): Secondary | ICD-10-CM | POA: Diagnosis not present

## 2016-03-06 DIAGNOSIS — Z7982 Long term (current) use of aspirin: Secondary | ICD-10-CM | POA: Diagnosis not present

## 2016-03-06 DIAGNOSIS — R69 Illness, unspecified: Secondary | ICD-10-CM | POA: Diagnosis not present

## 2016-03-06 DIAGNOSIS — Z7984 Long term (current) use of oral hypoglycemic drugs: Secondary | ICD-10-CM | POA: Diagnosis not present

## 2016-03-06 DIAGNOSIS — E119 Type 2 diabetes mellitus without complications: Secondary | ICD-10-CM | POA: Diagnosis not present

## 2016-03-06 DIAGNOSIS — Z79891 Long term (current) use of opiate analgesic: Secondary | ICD-10-CM | POA: Diagnosis not present

## 2016-03-06 DIAGNOSIS — I10 Essential (primary) hypertension: Secondary | ICD-10-CM | POA: Diagnosis not present

## 2016-03-06 DIAGNOSIS — L97222 Non-pressure chronic ulcer of left calf with fat layer exposed: Secondary | ICD-10-CM | POA: Diagnosis not present

## 2016-03-07 DIAGNOSIS — Z7984 Long term (current) use of oral hypoglycemic drugs: Secondary | ICD-10-CM | POA: Diagnosis not present

## 2016-03-07 DIAGNOSIS — E119 Type 2 diabetes mellitus without complications: Secondary | ICD-10-CM | POA: Diagnosis not present

## 2016-03-07 DIAGNOSIS — Z7982 Long term (current) use of aspirin: Secondary | ICD-10-CM | POA: Diagnosis not present

## 2016-03-07 DIAGNOSIS — I10 Essential (primary) hypertension: Secondary | ICD-10-CM | POA: Diagnosis not present

## 2016-03-07 DIAGNOSIS — I890 Lymphedema, not elsewhere classified: Secondary | ICD-10-CM | POA: Diagnosis not present

## 2016-03-07 DIAGNOSIS — Z79891 Long term (current) use of opiate analgesic: Secondary | ICD-10-CM | POA: Diagnosis not present

## 2016-03-07 DIAGNOSIS — R69 Illness, unspecified: Secondary | ICD-10-CM | POA: Diagnosis not present

## 2016-03-07 DIAGNOSIS — I872 Venous insufficiency (chronic) (peripheral): Secondary | ICD-10-CM | POA: Diagnosis not present

## 2016-03-07 DIAGNOSIS — L97222 Non-pressure chronic ulcer of left calf with fat layer exposed: Secondary | ICD-10-CM | POA: Diagnosis not present

## 2016-03-08 DIAGNOSIS — Z79891 Long term (current) use of opiate analgesic: Secondary | ICD-10-CM | POA: Diagnosis not present

## 2016-03-08 DIAGNOSIS — Z7982 Long term (current) use of aspirin: Secondary | ICD-10-CM | POA: Diagnosis not present

## 2016-03-08 DIAGNOSIS — Z7984 Long term (current) use of oral hypoglycemic drugs: Secondary | ICD-10-CM | POA: Diagnosis not present

## 2016-03-08 DIAGNOSIS — L97222 Non-pressure chronic ulcer of left calf with fat layer exposed: Secondary | ICD-10-CM | POA: Diagnosis not present

## 2016-03-08 DIAGNOSIS — R69 Illness, unspecified: Secondary | ICD-10-CM | POA: Diagnosis not present

## 2016-03-08 DIAGNOSIS — I890 Lymphedema, not elsewhere classified: Secondary | ICD-10-CM | POA: Diagnosis not present

## 2016-03-08 DIAGNOSIS — I10 Essential (primary) hypertension: Secondary | ICD-10-CM | POA: Diagnosis not present

## 2016-03-08 DIAGNOSIS — I872 Venous insufficiency (chronic) (peripheral): Secondary | ICD-10-CM | POA: Diagnosis not present

## 2016-03-08 DIAGNOSIS — E119 Type 2 diabetes mellitus without complications: Secondary | ICD-10-CM | POA: Diagnosis not present

## 2016-03-09 DIAGNOSIS — Z7982 Long term (current) use of aspirin: Secondary | ICD-10-CM | POA: Diagnosis not present

## 2016-03-09 DIAGNOSIS — R69 Illness, unspecified: Secondary | ICD-10-CM | POA: Diagnosis not present

## 2016-03-09 DIAGNOSIS — Z7984 Long term (current) use of oral hypoglycemic drugs: Secondary | ICD-10-CM | POA: Diagnosis not present

## 2016-03-09 DIAGNOSIS — L97222 Non-pressure chronic ulcer of left calf with fat layer exposed: Secondary | ICD-10-CM | POA: Diagnosis not present

## 2016-03-09 DIAGNOSIS — E119 Type 2 diabetes mellitus without complications: Secondary | ICD-10-CM | POA: Diagnosis not present

## 2016-03-09 DIAGNOSIS — I872 Venous insufficiency (chronic) (peripheral): Secondary | ICD-10-CM | POA: Diagnosis not present

## 2016-03-09 DIAGNOSIS — I10 Essential (primary) hypertension: Secondary | ICD-10-CM | POA: Diagnosis not present

## 2016-03-09 DIAGNOSIS — Z79891 Long term (current) use of opiate analgesic: Secondary | ICD-10-CM | POA: Diagnosis not present

## 2016-03-09 DIAGNOSIS — I890 Lymphedema, not elsewhere classified: Secondary | ICD-10-CM | POA: Diagnosis not present

## 2016-03-12 DIAGNOSIS — I10 Essential (primary) hypertension: Secondary | ICD-10-CM | POA: Diagnosis not present

## 2016-03-12 DIAGNOSIS — Z7982 Long term (current) use of aspirin: Secondary | ICD-10-CM | POA: Diagnosis not present

## 2016-03-12 DIAGNOSIS — Z7984 Long term (current) use of oral hypoglycemic drugs: Secondary | ICD-10-CM | POA: Diagnosis not present

## 2016-03-12 DIAGNOSIS — Z79891 Long term (current) use of opiate analgesic: Secondary | ICD-10-CM | POA: Diagnosis not present

## 2016-03-12 DIAGNOSIS — R69 Illness, unspecified: Secondary | ICD-10-CM | POA: Diagnosis not present

## 2016-03-12 DIAGNOSIS — I872 Venous insufficiency (chronic) (peripheral): Secondary | ICD-10-CM | POA: Diagnosis not present

## 2016-03-12 DIAGNOSIS — L97222 Non-pressure chronic ulcer of left calf with fat layer exposed: Secondary | ICD-10-CM | POA: Diagnosis not present

## 2016-03-12 DIAGNOSIS — I890 Lymphedema, not elsewhere classified: Secondary | ICD-10-CM | POA: Diagnosis not present

## 2016-03-12 DIAGNOSIS — E119 Type 2 diabetes mellitus without complications: Secondary | ICD-10-CM | POA: Diagnosis not present

## 2016-03-14 DIAGNOSIS — Z7982 Long term (current) use of aspirin: Secondary | ICD-10-CM | POA: Diagnosis not present

## 2016-03-14 DIAGNOSIS — Z7984 Long term (current) use of oral hypoglycemic drugs: Secondary | ICD-10-CM | POA: Diagnosis not present

## 2016-03-14 DIAGNOSIS — Z79891 Long term (current) use of opiate analgesic: Secondary | ICD-10-CM | POA: Diagnosis not present

## 2016-03-14 DIAGNOSIS — L97222 Non-pressure chronic ulcer of left calf with fat layer exposed: Secondary | ICD-10-CM | POA: Diagnosis not present

## 2016-03-14 DIAGNOSIS — I890 Lymphedema, not elsewhere classified: Secondary | ICD-10-CM | POA: Diagnosis not present

## 2016-03-14 DIAGNOSIS — E119 Type 2 diabetes mellitus without complications: Secondary | ICD-10-CM | POA: Diagnosis not present

## 2016-03-14 DIAGNOSIS — I10 Essential (primary) hypertension: Secondary | ICD-10-CM | POA: Diagnosis not present

## 2016-03-14 DIAGNOSIS — R69 Illness, unspecified: Secondary | ICD-10-CM | POA: Diagnosis not present

## 2016-03-14 DIAGNOSIS — I872 Venous insufficiency (chronic) (peripheral): Secondary | ICD-10-CM | POA: Diagnosis not present

## 2016-03-15 DIAGNOSIS — L97222 Non-pressure chronic ulcer of left calf with fat layer exposed: Secondary | ICD-10-CM | POA: Diagnosis not present

## 2016-03-15 DIAGNOSIS — R0602 Shortness of breath: Secondary | ICD-10-CM | POA: Diagnosis not present

## 2016-03-15 DIAGNOSIS — Z6841 Body Mass Index (BMI) 40.0 and over, adult: Secondary | ICD-10-CM | POA: Diagnosis not present

## 2016-03-15 DIAGNOSIS — I87312 Chronic venous hypertension (idiopathic) with ulcer of left lower extremity: Secondary | ICD-10-CM | POA: Diagnosis not present

## 2016-03-15 DIAGNOSIS — L03116 Cellulitis of left lower limb: Secondary | ICD-10-CM | POA: Diagnosis not present

## 2016-03-15 DIAGNOSIS — I872 Venous insufficiency (chronic) (peripheral): Secondary | ICD-10-CM | POA: Diagnosis not present

## 2016-03-16 DIAGNOSIS — I872 Venous insufficiency (chronic) (peripheral): Secondary | ICD-10-CM | POA: Diagnosis not present

## 2016-03-16 DIAGNOSIS — I10 Essential (primary) hypertension: Secondary | ICD-10-CM | POA: Diagnosis not present

## 2016-03-16 DIAGNOSIS — R69 Illness, unspecified: Secondary | ICD-10-CM | POA: Diagnosis not present

## 2016-03-16 DIAGNOSIS — I890 Lymphedema, not elsewhere classified: Secondary | ICD-10-CM | POA: Diagnosis not present

## 2016-03-16 DIAGNOSIS — Z7984 Long term (current) use of oral hypoglycemic drugs: Secondary | ICD-10-CM | POA: Diagnosis not present

## 2016-03-16 DIAGNOSIS — E119 Type 2 diabetes mellitus without complications: Secondary | ICD-10-CM | POA: Diagnosis not present

## 2016-03-16 DIAGNOSIS — Z7982 Long term (current) use of aspirin: Secondary | ICD-10-CM | POA: Diagnosis not present

## 2016-03-16 DIAGNOSIS — Z79891 Long term (current) use of opiate analgesic: Secondary | ICD-10-CM | POA: Diagnosis not present

## 2016-03-16 DIAGNOSIS — L97222 Non-pressure chronic ulcer of left calf with fat layer exposed: Secondary | ICD-10-CM | POA: Diagnosis not present

## 2016-03-19 DIAGNOSIS — I10 Essential (primary) hypertension: Secondary | ICD-10-CM | POA: Diagnosis not present

## 2016-03-19 DIAGNOSIS — E119 Type 2 diabetes mellitus without complications: Secondary | ICD-10-CM | POA: Diagnosis not present

## 2016-03-19 DIAGNOSIS — L97222 Non-pressure chronic ulcer of left calf with fat layer exposed: Secondary | ICD-10-CM | POA: Diagnosis not present

## 2016-03-19 DIAGNOSIS — I872 Venous insufficiency (chronic) (peripheral): Secondary | ICD-10-CM | POA: Diagnosis not present

## 2016-03-19 DIAGNOSIS — R69 Illness, unspecified: Secondary | ICD-10-CM | POA: Diagnosis not present

## 2016-03-19 DIAGNOSIS — Z7982 Long term (current) use of aspirin: Secondary | ICD-10-CM | POA: Diagnosis not present

## 2016-03-19 DIAGNOSIS — Z79891 Long term (current) use of opiate analgesic: Secondary | ICD-10-CM | POA: Diagnosis not present

## 2016-03-19 DIAGNOSIS — I890 Lymphedema, not elsewhere classified: Secondary | ICD-10-CM | POA: Diagnosis not present

## 2016-03-19 DIAGNOSIS — Z7984 Long term (current) use of oral hypoglycemic drugs: Secondary | ICD-10-CM | POA: Diagnosis not present

## 2016-03-21 DIAGNOSIS — I872 Venous insufficiency (chronic) (peripheral): Secondary | ICD-10-CM | POA: Diagnosis not present

## 2016-03-21 DIAGNOSIS — Z7982 Long term (current) use of aspirin: Secondary | ICD-10-CM | POA: Diagnosis not present

## 2016-03-21 DIAGNOSIS — R69 Illness, unspecified: Secondary | ICD-10-CM | POA: Diagnosis not present

## 2016-03-21 DIAGNOSIS — L97222 Non-pressure chronic ulcer of left calf with fat layer exposed: Secondary | ICD-10-CM | POA: Diagnosis not present

## 2016-03-21 DIAGNOSIS — I890 Lymphedema, not elsewhere classified: Secondary | ICD-10-CM | POA: Diagnosis not present

## 2016-03-21 DIAGNOSIS — Z7984 Long term (current) use of oral hypoglycemic drugs: Secondary | ICD-10-CM | POA: Diagnosis not present

## 2016-03-21 DIAGNOSIS — I10 Essential (primary) hypertension: Secondary | ICD-10-CM | POA: Diagnosis not present

## 2016-03-21 DIAGNOSIS — E119 Type 2 diabetes mellitus without complications: Secondary | ICD-10-CM | POA: Diagnosis not present

## 2016-03-21 DIAGNOSIS — Z79891 Long term (current) use of opiate analgesic: Secondary | ICD-10-CM | POA: Diagnosis not present

## 2016-03-23 DIAGNOSIS — Z7982 Long term (current) use of aspirin: Secondary | ICD-10-CM | POA: Diagnosis not present

## 2016-03-23 DIAGNOSIS — Z7984 Long term (current) use of oral hypoglycemic drugs: Secondary | ICD-10-CM | POA: Diagnosis not present

## 2016-03-23 DIAGNOSIS — I872 Venous insufficiency (chronic) (peripheral): Secondary | ICD-10-CM | POA: Diagnosis not present

## 2016-03-23 DIAGNOSIS — I890 Lymphedema, not elsewhere classified: Secondary | ICD-10-CM | POA: Diagnosis not present

## 2016-03-23 DIAGNOSIS — L97222 Non-pressure chronic ulcer of left calf with fat layer exposed: Secondary | ICD-10-CM | POA: Diagnosis not present

## 2016-03-23 DIAGNOSIS — R69 Illness, unspecified: Secondary | ICD-10-CM | POA: Diagnosis not present

## 2016-03-23 DIAGNOSIS — Z79891 Long term (current) use of opiate analgesic: Secondary | ICD-10-CM | POA: Diagnosis not present

## 2016-03-23 DIAGNOSIS — E119 Type 2 diabetes mellitus without complications: Secondary | ICD-10-CM | POA: Diagnosis not present

## 2016-03-23 DIAGNOSIS — I10 Essential (primary) hypertension: Secondary | ICD-10-CM | POA: Diagnosis not present

## 2016-03-26 DIAGNOSIS — Z79891 Long term (current) use of opiate analgesic: Secondary | ICD-10-CM | POA: Diagnosis not present

## 2016-03-26 DIAGNOSIS — Z7984 Long term (current) use of oral hypoglycemic drugs: Secondary | ICD-10-CM | POA: Diagnosis not present

## 2016-03-26 DIAGNOSIS — I890 Lymphedema, not elsewhere classified: Secondary | ICD-10-CM | POA: Diagnosis not present

## 2016-03-26 DIAGNOSIS — Z7982 Long term (current) use of aspirin: Secondary | ICD-10-CM | POA: Diagnosis not present

## 2016-03-26 DIAGNOSIS — L97222 Non-pressure chronic ulcer of left calf with fat layer exposed: Secondary | ICD-10-CM | POA: Diagnosis not present

## 2016-03-26 DIAGNOSIS — I872 Venous insufficiency (chronic) (peripheral): Secondary | ICD-10-CM | POA: Diagnosis not present

## 2016-03-26 DIAGNOSIS — R69 Illness, unspecified: Secondary | ICD-10-CM | POA: Diagnosis not present

## 2016-03-26 DIAGNOSIS — I10 Essential (primary) hypertension: Secondary | ICD-10-CM | POA: Diagnosis not present

## 2016-03-26 DIAGNOSIS — E119 Type 2 diabetes mellitus without complications: Secondary | ICD-10-CM | POA: Diagnosis not present

## 2016-03-28 DIAGNOSIS — R69 Illness, unspecified: Secondary | ICD-10-CM | POA: Diagnosis not present

## 2016-03-28 DIAGNOSIS — Z7984 Long term (current) use of oral hypoglycemic drugs: Secondary | ICD-10-CM | POA: Diagnosis not present

## 2016-03-28 DIAGNOSIS — E119 Type 2 diabetes mellitus without complications: Secondary | ICD-10-CM | POA: Diagnosis not present

## 2016-03-28 DIAGNOSIS — I10 Essential (primary) hypertension: Secondary | ICD-10-CM | POA: Diagnosis not present

## 2016-03-28 DIAGNOSIS — I872 Venous insufficiency (chronic) (peripheral): Secondary | ICD-10-CM | POA: Diagnosis not present

## 2016-03-28 DIAGNOSIS — Z7982 Long term (current) use of aspirin: Secondary | ICD-10-CM | POA: Diagnosis not present

## 2016-03-28 DIAGNOSIS — L97222 Non-pressure chronic ulcer of left calf with fat layer exposed: Secondary | ICD-10-CM | POA: Diagnosis not present

## 2016-03-28 DIAGNOSIS — Z79891 Long term (current) use of opiate analgesic: Secondary | ICD-10-CM | POA: Diagnosis not present

## 2016-03-28 DIAGNOSIS — I890 Lymphedema, not elsewhere classified: Secondary | ICD-10-CM | POA: Diagnosis not present

## 2016-03-30 DIAGNOSIS — Z7982 Long term (current) use of aspirin: Secondary | ICD-10-CM | POA: Diagnosis not present

## 2016-03-30 DIAGNOSIS — E119 Type 2 diabetes mellitus without complications: Secondary | ICD-10-CM | POA: Diagnosis not present

## 2016-03-30 DIAGNOSIS — Z7984 Long term (current) use of oral hypoglycemic drugs: Secondary | ICD-10-CM | POA: Diagnosis not present

## 2016-03-30 DIAGNOSIS — I890 Lymphedema, not elsewhere classified: Secondary | ICD-10-CM | POA: Diagnosis not present

## 2016-03-30 DIAGNOSIS — I10 Essential (primary) hypertension: Secondary | ICD-10-CM | POA: Diagnosis not present

## 2016-03-30 DIAGNOSIS — R69 Illness, unspecified: Secondary | ICD-10-CM | POA: Diagnosis not present

## 2016-03-30 DIAGNOSIS — Z79891 Long term (current) use of opiate analgesic: Secondary | ICD-10-CM | POA: Diagnosis not present

## 2016-03-30 DIAGNOSIS — I872 Venous insufficiency (chronic) (peripheral): Secondary | ICD-10-CM | POA: Diagnosis not present

## 2016-03-30 DIAGNOSIS — L97222 Non-pressure chronic ulcer of left calf with fat layer exposed: Secondary | ICD-10-CM | POA: Diagnosis not present

## 2016-04-02 DIAGNOSIS — I890 Lymphedema, not elsewhere classified: Secondary | ICD-10-CM | POA: Diagnosis not present

## 2016-04-02 DIAGNOSIS — I872 Venous insufficiency (chronic) (peripheral): Secondary | ICD-10-CM | POA: Diagnosis not present

## 2016-04-02 DIAGNOSIS — Z79891 Long term (current) use of opiate analgesic: Secondary | ICD-10-CM | POA: Diagnosis not present

## 2016-04-02 DIAGNOSIS — I10 Essential (primary) hypertension: Secondary | ICD-10-CM | POA: Diagnosis not present

## 2016-04-02 DIAGNOSIS — Z7982 Long term (current) use of aspirin: Secondary | ICD-10-CM | POA: Diagnosis not present

## 2016-04-02 DIAGNOSIS — E119 Type 2 diabetes mellitus without complications: Secondary | ICD-10-CM | POA: Diagnosis not present

## 2016-04-02 DIAGNOSIS — Z7984 Long term (current) use of oral hypoglycemic drugs: Secondary | ICD-10-CM | POA: Diagnosis not present

## 2016-04-02 DIAGNOSIS — L97222 Non-pressure chronic ulcer of left calf with fat layer exposed: Secondary | ICD-10-CM | POA: Diagnosis not present

## 2016-04-02 DIAGNOSIS — R69 Illness, unspecified: Secondary | ICD-10-CM | POA: Diagnosis not present

## 2016-04-04 DIAGNOSIS — L97222 Non-pressure chronic ulcer of left calf with fat layer exposed: Secondary | ICD-10-CM | POA: Diagnosis not present

## 2016-04-04 DIAGNOSIS — I872 Venous insufficiency (chronic) (peripheral): Secondary | ICD-10-CM | POA: Diagnosis not present

## 2016-04-04 DIAGNOSIS — I87312 Chronic venous hypertension (idiopathic) with ulcer of left lower extremity: Secondary | ICD-10-CM | POA: Diagnosis not present

## 2016-04-05 DIAGNOSIS — Z01818 Encounter for other preprocedural examination: Secondary | ICD-10-CM | POA: Diagnosis not present

## 2016-04-05 DIAGNOSIS — I87319 Chronic venous hypertension (idiopathic) with ulcer of unspecified lower extremity: Secondary | ICD-10-CM | POA: Diagnosis not present

## 2016-04-05 DIAGNOSIS — Z5329 Procedure and treatment not carried out because of patient's decision for other reasons: Secondary | ICD-10-CM | POA: Diagnosis not present

## 2016-04-05 DIAGNOSIS — Z01810 Encounter for preprocedural cardiovascular examination: Secondary | ICD-10-CM | POA: Diagnosis not present

## 2016-04-06 DIAGNOSIS — L97222 Non-pressure chronic ulcer of left calf with fat layer exposed: Secondary | ICD-10-CM | POA: Diagnosis not present

## 2016-04-06 DIAGNOSIS — Z7984 Long term (current) use of oral hypoglycemic drugs: Secondary | ICD-10-CM | POA: Diagnosis not present

## 2016-04-06 DIAGNOSIS — R69 Illness, unspecified: Secondary | ICD-10-CM | POA: Diagnosis not present

## 2016-04-06 DIAGNOSIS — I890 Lymphedema, not elsewhere classified: Secondary | ICD-10-CM | POA: Diagnosis not present

## 2016-04-06 DIAGNOSIS — Z7982 Long term (current) use of aspirin: Secondary | ICD-10-CM | POA: Diagnosis not present

## 2016-04-06 DIAGNOSIS — I10 Essential (primary) hypertension: Secondary | ICD-10-CM | POA: Diagnosis not present

## 2016-04-06 DIAGNOSIS — Z79891 Long term (current) use of opiate analgesic: Secondary | ICD-10-CM | POA: Diagnosis not present

## 2016-04-06 DIAGNOSIS — E119 Type 2 diabetes mellitus without complications: Secondary | ICD-10-CM | POA: Diagnosis not present

## 2016-04-06 DIAGNOSIS — I872 Venous insufficiency (chronic) (peripheral): Secondary | ICD-10-CM | POA: Diagnosis not present

## 2016-04-10 DIAGNOSIS — E1129 Type 2 diabetes mellitus with other diabetic kidney complication: Secondary | ICD-10-CM | POA: Diagnosis not present

## 2016-04-10 DIAGNOSIS — E1165 Type 2 diabetes mellitus with hyperglycemia: Secondary | ICD-10-CM | POA: Diagnosis not present

## 2016-04-10 DIAGNOSIS — I10 Essential (primary) hypertension: Secondary | ICD-10-CM | POA: Diagnosis not present

## 2016-04-10 DIAGNOSIS — E781 Pure hyperglyceridemia: Secondary | ICD-10-CM | POA: Diagnosis not present

## 2016-04-11 DIAGNOSIS — Z79891 Long term (current) use of opiate analgesic: Secondary | ICD-10-CM | POA: Diagnosis not present

## 2016-04-11 DIAGNOSIS — L97222 Non-pressure chronic ulcer of left calf with fat layer exposed: Secondary | ICD-10-CM | POA: Diagnosis not present

## 2016-04-11 DIAGNOSIS — Z7982 Long term (current) use of aspirin: Secondary | ICD-10-CM | POA: Diagnosis not present

## 2016-04-11 DIAGNOSIS — E119 Type 2 diabetes mellitus without complications: Secondary | ICD-10-CM | POA: Diagnosis not present

## 2016-04-11 DIAGNOSIS — I10 Essential (primary) hypertension: Secondary | ICD-10-CM | POA: Diagnosis not present

## 2016-04-11 DIAGNOSIS — I890 Lymphedema, not elsewhere classified: Secondary | ICD-10-CM | POA: Diagnosis not present

## 2016-04-11 DIAGNOSIS — R69 Illness, unspecified: Secondary | ICD-10-CM | POA: Diagnosis not present

## 2016-04-11 DIAGNOSIS — Z7984 Long term (current) use of oral hypoglycemic drugs: Secondary | ICD-10-CM | POA: Diagnosis not present

## 2016-04-11 DIAGNOSIS — I872 Venous insufficiency (chronic) (peripheral): Secondary | ICD-10-CM | POA: Diagnosis not present

## 2016-04-13 DIAGNOSIS — Z7984 Long term (current) use of oral hypoglycemic drugs: Secondary | ICD-10-CM | POA: Diagnosis not present

## 2016-04-13 DIAGNOSIS — L97222 Non-pressure chronic ulcer of left calf with fat layer exposed: Secondary | ICD-10-CM | POA: Diagnosis not present

## 2016-04-13 DIAGNOSIS — I872 Venous insufficiency (chronic) (peripheral): Secondary | ICD-10-CM | POA: Diagnosis not present

## 2016-04-13 DIAGNOSIS — Z7982 Long term (current) use of aspirin: Secondary | ICD-10-CM | POA: Diagnosis not present

## 2016-04-13 DIAGNOSIS — R69 Illness, unspecified: Secondary | ICD-10-CM | POA: Diagnosis not present

## 2016-04-13 DIAGNOSIS — I10 Essential (primary) hypertension: Secondary | ICD-10-CM | POA: Diagnosis not present

## 2016-04-13 DIAGNOSIS — I890 Lymphedema, not elsewhere classified: Secondary | ICD-10-CM | POA: Diagnosis not present

## 2016-04-13 DIAGNOSIS — E119 Type 2 diabetes mellitus without complications: Secondary | ICD-10-CM | POA: Diagnosis not present

## 2016-04-13 DIAGNOSIS — Z79891 Long term (current) use of opiate analgesic: Secondary | ICD-10-CM | POA: Diagnosis not present

## 2016-04-14 DIAGNOSIS — G2581 Restless legs syndrome: Secondary | ICD-10-CM | POA: Diagnosis not present

## 2016-04-14 DIAGNOSIS — R609 Edema, unspecified: Secondary | ICD-10-CM | POA: Diagnosis not present

## 2016-04-14 DIAGNOSIS — R51 Headache: Secondary | ICD-10-CM | POA: Diagnosis not present

## 2016-04-14 DIAGNOSIS — E1165 Type 2 diabetes mellitus with hyperglycemia: Secondary | ICD-10-CM | POA: Diagnosis not present

## 2016-04-14 DIAGNOSIS — E1129 Type 2 diabetes mellitus with other diabetic kidney complication: Secondary | ICD-10-CM | POA: Diagnosis not present

## 2016-04-14 DIAGNOSIS — E039 Hypothyroidism, unspecified: Secondary | ICD-10-CM | POA: Diagnosis not present

## 2016-04-14 DIAGNOSIS — I10 Essential (primary) hypertension: Secondary | ICD-10-CM | POA: Diagnosis not present

## 2016-04-16 DIAGNOSIS — I872 Venous insufficiency (chronic) (peripheral): Secondary | ICD-10-CM | POA: Diagnosis not present

## 2016-04-16 DIAGNOSIS — E119 Type 2 diabetes mellitus without complications: Secondary | ICD-10-CM | POA: Diagnosis not present

## 2016-04-16 DIAGNOSIS — I890 Lymphedema, not elsewhere classified: Secondary | ICD-10-CM | POA: Diagnosis not present

## 2016-04-16 DIAGNOSIS — L97222 Non-pressure chronic ulcer of left calf with fat layer exposed: Secondary | ICD-10-CM | POA: Diagnosis not present

## 2016-04-16 DIAGNOSIS — I10 Essential (primary) hypertension: Secondary | ICD-10-CM | POA: Diagnosis not present

## 2016-04-16 DIAGNOSIS — R69 Illness, unspecified: Secondary | ICD-10-CM | POA: Diagnosis not present

## 2016-04-16 DIAGNOSIS — Z7984 Long term (current) use of oral hypoglycemic drugs: Secondary | ICD-10-CM | POA: Diagnosis not present

## 2016-04-18 DIAGNOSIS — E119 Type 2 diabetes mellitus without complications: Secondary | ICD-10-CM | POA: Diagnosis not present

## 2016-04-18 DIAGNOSIS — I890 Lymphedema, not elsewhere classified: Secondary | ICD-10-CM | POA: Diagnosis not present

## 2016-04-18 DIAGNOSIS — Z7984 Long term (current) use of oral hypoglycemic drugs: Secondary | ICD-10-CM | POA: Diagnosis not present

## 2016-04-18 DIAGNOSIS — R69 Illness, unspecified: Secondary | ICD-10-CM | POA: Diagnosis not present

## 2016-04-18 DIAGNOSIS — I872 Venous insufficiency (chronic) (peripheral): Secondary | ICD-10-CM | POA: Diagnosis not present

## 2016-04-18 DIAGNOSIS — L97222 Non-pressure chronic ulcer of left calf with fat layer exposed: Secondary | ICD-10-CM | POA: Diagnosis not present

## 2016-04-18 DIAGNOSIS — I10 Essential (primary) hypertension: Secondary | ICD-10-CM | POA: Diagnosis not present

## 2016-04-20 DIAGNOSIS — E119 Type 2 diabetes mellitus without complications: Secondary | ICD-10-CM | POA: Diagnosis not present

## 2016-04-20 DIAGNOSIS — I890 Lymphedema, not elsewhere classified: Secondary | ICD-10-CM | POA: Diagnosis not present

## 2016-04-20 DIAGNOSIS — L97222 Non-pressure chronic ulcer of left calf with fat layer exposed: Secondary | ICD-10-CM | POA: Diagnosis not present

## 2016-04-20 DIAGNOSIS — R69 Illness, unspecified: Secondary | ICD-10-CM | POA: Diagnosis not present

## 2016-04-20 DIAGNOSIS — I10 Essential (primary) hypertension: Secondary | ICD-10-CM | POA: Diagnosis not present

## 2016-04-20 DIAGNOSIS — I872 Venous insufficiency (chronic) (peripheral): Secondary | ICD-10-CM | POA: Diagnosis not present

## 2016-04-20 DIAGNOSIS — Z7984 Long term (current) use of oral hypoglycemic drugs: Secondary | ICD-10-CM | POA: Diagnosis not present

## 2016-04-23 DIAGNOSIS — R69 Illness, unspecified: Secondary | ICD-10-CM | POA: Diagnosis not present

## 2016-04-23 DIAGNOSIS — I890 Lymphedema, not elsewhere classified: Secondary | ICD-10-CM | POA: Diagnosis not present

## 2016-04-23 DIAGNOSIS — E119 Type 2 diabetes mellitus without complications: Secondary | ICD-10-CM | POA: Diagnosis not present

## 2016-04-23 DIAGNOSIS — I872 Venous insufficiency (chronic) (peripheral): Secondary | ICD-10-CM | POA: Diagnosis not present

## 2016-04-23 DIAGNOSIS — I10 Essential (primary) hypertension: Secondary | ICD-10-CM | POA: Diagnosis not present

## 2016-04-23 DIAGNOSIS — L97222 Non-pressure chronic ulcer of left calf with fat layer exposed: Secondary | ICD-10-CM | POA: Diagnosis not present

## 2016-04-23 DIAGNOSIS — Z7984 Long term (current) use of oral hypoglycemic drugs: Secondary | ICD-10-CM | POA: Diagnosis not present

## 2016-04-25 DIAGNOSIS — I872 Venous insufficiency (chronic) (peripheral): Secondary | ICD-10-CM | POA: Diagnosis not present

## 2016-04-25 DIAGNOSIS — L97222 Non-pressure chronic ulcer of left calf with fat layer exposed: Secondary | ICD-10-CM | POA: Diagnosis not present

## 2016-04-27 DIAGNOSIS — I872 Venous insufficiency (chronic) (peripheral): Secondary | ICD-10-CM | POA: Diagnosis not present

## 2016-04-27 DIAGNOSIS — Z7984 Long term (current) use of oral hypoglycemic drugs: Secondary | ICD-10-CM | POA: Diagnosis not present

## 2016-04-27 DIAGNOSIS — R69 Illness, unspecified: Secondary | ICD-10-CM | POA: Diagnosis not present

## 2016-04-27 DIAGNOSIS — I890 Lymphedema, not elsewhere classified: Secondary | ICD-10-CM | POA: Diagnosis not present

## 2016-04-27 DIAGNOSIS — L97222 Non-pressure chronic ulcer of left calf with fat layer exposed: Secondary | ICD-10-CM | POA: Diagnosis not present

## 2016-04-27 DIAGNOSIS — I10 Essential (primary) hypertension: Secondary | ICD-10-CM | POA: Diagnosis not present

## 2016-04-27 DIAGNOSIS — E119 Type 2 diabetes mellitus without complications: Secondary | ICD-10-CM | POA: Diagnosis not present

## 2016-04-30 DIAGNOSIS — L97222 Non-pressure chronic ulcer of left calf with fat layer exposed: Secondary | ICD-10-CM | POA: Diagnosis not present

## 2016-04-30 DIAGNOSIS — I890 Lymphedema, not elsewhere classified: Secondary | ICD-10-CM | POA: Diagnosis not present

## 2016-04-30 DIAGNOSIS — Z7984 Long term (current) use of oral hypoglycemic drugs: Secondary | ICD-10-CM | POA: Diagnosis not present

## 2016-04-30 DIAGNOSIS — I10 Essential (primary) hypertension: Secondary | ICD-10-CM | POA: Diagnosis not present

## 2016-04-30 DIAGNOSIS — I872 Venous insufficiency (chronic) (peripheral): Secondary | ICD-10-CM | POA: Diagnosis not present

## 2016-04-30 DIAGNOSIS — R69 Illness, unspecified: Secondary | ICD-10-CM | POA: Diagnosis not present

## 2016-04-30 DIAGNOSIS — E119 Type 2 diabetes mellitus without complications: Secondary | ICD-10-CM | POA: Diagnosis not present

## 2016-05-02 DIAGNOSIS — Z01810 Encounter for preprocedural cardiovascular examination: Secondary | ICD-10-CM | POA: Diagnosis not present

## 2016-05-03 DIAGNOSIS — Z7984 Long term (current) use of oral hypoglycemic drugs: Secondary | ICD-10-CM | POA: Diagnosis not present

## 2016-05-03 DIAGNOSIS — I872 Venous insufficiency (chronic) (peripheral): Secondary | ICD-10-CM | POA: Diagnosis not present

## 2016-05-03 DIAGNOSIS — L97222 Non-pressure chronic ulcer of left calf with fat layer exposed: Secondary | ICD-10-CM | POA: Diagnosis not present

## 2016-05-03 DIAGNOSIS — I10 Essential (primary) hypertension: Secondary | ICD-10-CM | POA: Diagnosis not present

## 2016-05-03 DIAGNOSIS — E119 Type 2 diabetes mellitus without complications: Secondary | ICD-10-CM | POA: Diagnosis not present

## 2016-05-03 DIAGNOSIS — I890 Lymphedema, not elsewhere classified: Secondary | ICD-10-CM | POA: Diagnosis not present

## 2016-05-03 DIAGNOSIS — R69 Illness, unspecified: Secondary | ICD-10-CM | POA: Diagnosis not present

## 2016-05-04 DIAGNOSIS — L97929 Non-pressure chronic ulcer of unspecified part of left lower leg with unspecified severity: Secondary | ICD-10-CM | POA: Diagnosis not present

## 2016-05-04 DIAGNOSIS — E119 Type 2 diabetes mellitus without complications: Secondary | ICD-10-CM | POA: Diagnosis not present

## 2016-05-04 DIAGNOSIS — L0391 Acute lymphangitis, unspecified: Secondary | ICD-10-CM | POA: Diagnosis not present

## 2016-05-04 DIAGNOSIS — G8929 Other chronic pain: Secondary | ICD-10-CM | POA: Diagnosis not present

## 2016-05-04 DIAGNOSIS — E1140 Type 2 diabetes mellitus with diabetic neuropathy, unspecified: Secondary | ICD-10-CM | POA: Diagnosis not present

## 2016-05-04 DIAGNOSIS — L97828 Non-pressure chronic ulcer of other part of left lower leg with other specified severity: Secondary | ICD-10-CM | POA: Diagnosis not present

## 2016-05-04 DIAGNOSIS — L03116 Cellulitis of left lower limb: Secondary | ICD-10-CM | POA: Diagnosis not present

## 2016-05-04 DIAGNOSIS — M6281 Muscle weakness (generalized): Secondary | ICD-10-CM | POA: Diagnosis not present

## 2016-05-04 DIAGNOSIS — E039 Hypothyroidism, unspecified: Secondary | ICD-10-CM | POA: Diagnosis not present

## 2016-05-04 DIAGNOSIS — G2581 Restless legs syndrome: Secondary | ICD-10-CM | POA: Diagnosis not present

## 2016-05-04 DIAGNOSIS — L97229 Non-pressure chronic ulcer of left calf with unspecified severity: Secondary | ICD-10-CM | POA: Diagnosis not present

## 2016-05-04 DIAGNOSIS — L97818 Non-pressure chronic ulcer of other part of right lower leg with other specified severity: Secondary | ICD-10-CM | POA: Diagnosis not present

## 2016-05-04 DIAGNOSIS — Z48817 Encounter for surgical aftercare following surgery on the skin and subcutaneous tissue: Secondary | ICD-10-CM | POA: Diagnosis not present

## 2016-05-04 DIAGNOSIS — L03115 Cellulitis of right lower limb: Secondary | ICD-10-CM | POA: Diagnosis not present

## 2016-05-04 DIAGNOSIS — R609 Edema, unspecified: Secondary | ICD-10-CM | POA: Diagnosis not present

## 2016-05-04 DIAGNOSIS — R262 Difficulty in walking, not elsewhere classified: Secondary | ICD-10-CM | POA: Diagnosis not present

## 2016-05-04 DIAGNOSIS — G4733 Obstructive sleep apnea (adult) (pediatric): Secondary | ICD-10-CM | POA: Diagnosis not present

## 2016-05-04 DIAGNOSIS — E669 Obesity, unspecified: Secondary | ICD-10-CM | POA: Diagnosis not present

## 2016-05-04 DIAGNOSIS — T8189XA Other complications of procedures, not elsewhere classified, initial encounter: Secondary | ICD-10-CM | POA: Diagnosis not present

## 2016-05-04 DIAGNOSIS — M62838 Other muscle spasm: Secondary | ICD-10-CM | POA: Diagnosis not present

## 2016-05-04 DIAGNOSIS — F339 Major depressive disorder, recurrent, unspecified: Secondary | ICD-10-CM | POA: Diagnosis not present

## 2016-05-04 DIAGNOSIS — I509 Heart failure, unspecified: Secondary | ICD-10-CM | POA: Diagnosis not present

## 2016-05-04 DIAGNOSIS — I872 Venous insufficiency (chronic) (peripheral): Secondary | ICD-10-CM | POA: Diagnosis not present

## 2016-05-04 DIAGNOSIS — E1142 Type 2 diabetes mellitus with diabetic polyneuropathy: Secondary | ICD-10-CM | POA: Diagnosis not present

## 2016-05-04 DIAGNOSIS — D72829 Elevated white blood cell count, unspecified: Secondary | ICD-10-CM | POA: Diagnosis not present

## 2016-05-04 DIAGNOSIS — G894 Chronic pain syndrome: Secondary | ICD-10-CM | POA: Diagnosis not present

## 2016-05-04 DIAGNOSIS — I10 Essential (primary) hypertension: Secondary | ICD-10-CM | POA: Diagnosis not present

## 2016-05-04 DIAGNOSIS — M1990 Unspecified osteoarthritis, unspecified site: Secondary | ICD-10-CM | POA: Diagnosis not present

## 2016-05-04 DIAGNOSIS — Z0289 Encounter for other administrative examinations: Secondary | ICD-10-CM | POA: Diagnosis not present

## 2016-05-05 DIAGNOSIS — L0391 Acute lymphangitis, unspecified: Secondary | ICD-10-CM | POA: Diagnosis not present

## 2016-05-06 DIAGNOSIS — L0391 Acute lymphangitis, unspecified: Secondary | ICD-10-CM | POA: Diagnosis not present

## 2016-05-07 DIAGNOSIS — L0391 Acute lymphangitis, unspecified: Secondary | ICD-10-CM | POA: Diagnosis not present

## 2016-05-08 DIAGNOSIS — E11622 Type 2 diabetes mellitus with other skin ulcer: Secondary | ICD-10-CM | POA: Diagnosis not present

## 2016-05-08 DIAGNOSIS — L03116 Cellulitis of left lower limb: Secondary | ICD-10-CM | POA: Diagnosis not present

## 2016-05-08 DIAGNOSIS — D72829 Elevated white blood cell count, unspecified: Secondary | ICD-10-CM | POA: Diagnosis not present

## 2016-05-08 DIAGNOSIS — Z48817 Encounter for surgical aftercare following surgery on the skin and subcutaneous tissue: Secondary | ICD-10-CM | POA: Diagnosis not present

## 2016-05-08 DIAGNOSIS — L0391 Acute lymphangitis, unspecified: Secondary | ICD-10-CM | POA: Diagnosis not present

## 2016-05-08 DIAGNOSIS — I83009 Varicose veins of unspecified lower extremity with ulcer of unspecified site: Secondary | ICD-10-CM | POA: Diagnosis not present

## 2016-05-08 DIAGNOSIS — L97828 Non-pressure chronic ulcer of other part of left lower leg with other specified severity: Secondary | ICD-10-CM | POA: Diagnosis not present

## 2016-05-08 DIAGNOSIS — G4733 Obstructive sleep apnea (adult) (pediatric): Secondary | ICD-10-CM | POA: Diagnosis not present

## 2016-05-08 DIAGNOSIS — R0602 Shortness of breath: Secondary | ICD-10-CM | POA: Diagnosis not present

## 2016-05-08 DIAGNOSIS — Z0289 Encounter for other administrative examinations: Secondary | ICD-10-CM | POA: Diagnosis not present

## 2016-05-08 DIAGNOSIS — E669 Obesity, unspecified: Secondary | ICD-10-CM | POA: Diagnosis not present

## 2016-05-08 DIAGNOSIS — M6281 Muscle weakness (generalized): Secondary | ICD-10-CM | POA: Diagnosis not present

## 2016-05-08 DIAGNOSIS — L97929 Non-pressure chronic ulcer of unspecified part of left lower leg with unspecified severity: Secondary | ICD-10-CM | POA: Diagnosis not present

## 2016-05-08 DIAGNOSIS — R609 Edema, unspecified: Secondary | ICD-10-CM | POA: Diagnosis not present

## 2016-05-08 DIAGNOSIS — Z6841 Body Mass Index (BMI) 40.0 and over, adult: Secondary | ICD-10-CM | POA: Diagnosis not present

## 2016-05-08 DIAGNOSIS — M62838 Other muscle spasm: Secondary | ICD-10-CM | POA: Diagnosis not present

## 2016-05-08 DIAGNOSIS — L03115 Cellulitis of right lower limb: Secondary | ICD-10-CM | POA: Diagnosis not present

## 2016-05-08 DIAGNOSIS — L97909 Non-pressure chronic ulcer of unspecified part of unspecified lower leg with unspecified severity: Secondary | ICD-10-CM | POA: Diagnosis not present

## 2016-05-08 DIAGNOSIS — L97222 Non-pressure chronic ulcer of left calf with fat layer exposed: Secondary | ICD-10-CM | POA: Diagnosis not present

## 2016-05-08 DIAGNOSIS — R262 Difficulty in walking, not elsewhere classified: Secondary | ICD-10-CM | POA: Diagnosis not present

## 2016-05-08 DIAGNOSIS — T814XXA Infection following a procedure, initial encounter: Secondary | ICD-10-CM | POA: Diagnosis not present

## 2016-05-08 DIAGNOSIS — E119 Type 2 diabetes mellitus without complications: Secondary | ICD-10-CM | POA: Diagnosis not present

## 2016-05-08 DIAGNOSIS — E039 Hypothyroidism, unspecified: Secondary | ICD-10-CM | POA: Diagnosis not present

## 2016-05-08 DIAGNOSIS — M1990 Unspecified osteoarthritis, unspecified site: Secondary | ICD-10-CM | POA: Diagnosis not present

## 2016-05-08 DIAGNOSIS — I509 Heart failure, unspecified: Secondary | ICD-10-CM | POA: Diagnosis not present

## 2016-05-08 DIAGNOSIS — I10 Essential (primary) hypertension: Secondary | ICD-10-CM | POA: Diagnosis not present

## 2016-05-08 DIAGNOSIS — F339 Major depressive disorder, recurrent, unspecified: Secondary | ICD-10-CM | POA: Diagnosis not present

## 2016-05-08 DIAGNOSIS — L231 Allergic contact dermatitis due to adhesives: Secondary | ICD-10-CM | POA: Diagnosis not present

## 2016-05-08 DIAGNOSIS — I872 Venous insufficiency (chronic) (peripheral): Secondary | ICD-10-CM | POA: Diagnosis not present

## 2016-05-08 DIAGNOSIS — E1140 Type 2 diabetes mellitus with diabetic neuropathy, unspecified: Secondary | ICD-10-CM | POA: Diagnosis not present

## 2016-05-08 DIAGNOSIS — J189 Pneumonia, unspecified organism: Secondary | ICD-10-CM | POA: Diagnosis not present

## 2016-05-08 DIAGNOSIS — G2581 Restless legs syndrome: Secondary | ICD-10-CM | POA: Diagnosis not present

## 2016-05-08 DIAGNOSIS — G894 Chronic pain syndrome: Secondary | ICD-10-CM | POA: Diagnosis not present

## 2016-05-09 DIAGNOSIS — I10 Essential (primary) hypertension: Secondary | ICD-10-CM | POA: Diagnosis not present

## 2016-05-09 DIAGNOSIS — L97909 Non-pressure chronic ulcer of unspecified part of unspecified lower leg with unspecified severity: Secondary | ICD-10-CM | POA: Diagnosis not present

## 2016-05-09 DIAGNOSIS — I83009 Varicose veins of unspecified lower extremity with ulcer of unspecified site: Secondary | ICD-10-CM | POA: Diagnosis not present

## 2016-05-09 DIAGNOSIS — E11622 Type 2 diabetes mellitus with other skin ulcer: Secondary | ICD-10-CM | POA: Diagnosis not present

## 2016-05-11 DIAGNOSIS — L231 Allergic contact dermatitis due to adhesives: Secondary | ICD-10-CM | POA: Diagnosis not present

## 2016-05-14 DIAGNOSIS — Z48817 Encounter for surgical aftercare following surgery on the skin and subcutaneous tissue: Secondary | ICD-10-CM | POA: Diagnosis not present

## 2016-05-14 DIAGNOSIS — K219 Gastro-esophageal reflux disease without esophagitis: Secondary | ICD-10-CM | POA: Diagnosis not present

## 2016-05-14 DIAGNOSIS — Z01810 Encounter for preprocedural cardiovascular examination: Secondary | ICD-10-CM | POA: Diagnosis not present

## 2016-05-14 DIAGNOSIS — Z6841 Body Mass Index (BMI) 40.0 and over, adult: Secondary | ICD-10-CM | POA: Diagnosis not present

## 2016-05-14 DIAGNOSIS — E669 Obesity, unspecified: Secondary | ICD-10-CM | POA: Diagnosis not present

## 2016-05-14 DIAGNOSIS — L03116 Cellulitis of left lower limb: Secondary | ICD-10-CM | POA: Diagnosis not present

## 2016-05-14 DIAGNOSIS — E039 Hypothyroidism, unspecified: Secondary | ICD-10-CM | POA: Diagnosis not present

## 2016-05-14 DIAGNOSIS — I872 Venous insufficiency (chronic) (peripheral): Secondary | ICD-10-CM | POA: Diagnosis not present

## 2016-05-14 DIAGNOSIS — R0602 Shortness of breath: Secondary | ICD-10-CM | POA: Diagnosis not present

## 2016-05-14 DIAGNOSIS — G2581 Restless legs syndrome: Secondary | ICD-10-CM | POA: Diagnosis not present

## 2016-05-14 DIAGNOSIS — G894 Chronic pain syndrome: Secondary | ICD-10-CM | POA: Diagnosis not present

## 2016-05-14 DIAGNOSIS — R262 Difficulty in walking, not elsewhere classified: Secondary | ICD-10-CM | POA: Diagnosis not present

## 2016-05-14 DIAGNOSIS — L97128 Non-pressure chronic ulcer of left thigh with other specified severity: Secondary | ICD-10-CM | POA: Diagnosis not present

## 2016-05-14 DIAGNOSIS — E1140 Type 2 diabetes mellitus with diabetic neuropathy, unspecified: Secondary | ICD-10-CM | POA: Diagnosis not present

## 2016-05-14 DIAGNOSIS — L97929 Non-pressure chronic ulcer of unspecified part of left lower leg with unspecified severity: Secondary | ICD-10-CM | POA: Diagnosis not present

## 2016-05-14 DIAGNOSIS — T814XXA Infection following a procedure, initial encounter: Secondary | ICD-10-CM | POA: Diagnosis not present

## 2016-05-14 DIAGNOSIS — J449 Chronic obstructive pulmonary disease, unspecified: Secondary | ICD-10-CM | POA: Diagnosis not present

## 2016-05-14 DIAGNOSIS — L97828 Non-pressure chronic ulcer of other part of left lower leg with other specified severity: Secondary | ICD-10-CM | POA: Diagnosis not present

## 2016-05-14 DIAGNOSIS — L97222 Non-pressure chronic ulcer of left calf with fat layer exposed: Secondary | ICD-10-CM | POA: Diagnosis not present

## 2016-05-14 DIAGNOSIS — R609 Edema, unspecified: Secondary | ICD-10-CM | POA: Diagnosis not present

## 2016-05-14 DIAGNOSIS — M1990 Unspecified osteoarthritis, unspecified site: Secondary | ICD-10-CM | POA: Diagnosis not present

## 2016-05-14 DIAGNOSIS — F339 Major depressive disorder, recurrent, unspecified: Secondary | ICD-10-CM | POA: Diagnosis not present

## 2016-05-14 DIAGNOSIS — G4733 Obstructive sleep apnea (adult) (pediatric): Secondary | ICD-10-CM | POA: Diagnosis not present

## 2016-05-14 DIAGNOSIS — M62838 Other muscle spasm: Secondary | ICD-10-CM | POA: Diagnosis not present

## 2016-05-14 DIAGNOSIS — L03115 Cellulitis of right lower limb: Secondary | ICD-10-CM | POA: Diagnosis not present

## 2016-05-14 DIAGNOSIS — I10 Essential (primary) hypertension: Secondary | ICD-10-CM | POA: Diagnosis not present

## 2016-05-14 DIAGNOSIS — M6281 Muscle weakness (generalized): Secondary | ICD-10-CM | POA: Diagnosis not present

## 2016-05-14 DIAGNOSIS — I509 Heart failure, unspecified: Secondary | ICD-10-CM | POA: Diagnosis not present

## 2016-05-14 DIAGNOSIS — E119 Type 2 diabetes mellitus without complications: Secondary | ICD-10-CM | POA: Diagnosis not present

## 2016-05-14 DIAGNOSIS — J189 Pneumonia, unspecified organism: Secondary | ICD-10-CM | POA: Diagnosis not present

## 2016-05-15 DIAGNOSIS — L03116 Cellulitis of left lower limb: Secondary | ICD-10-CM | POA: Diagnosis not present

## 2016-05-16 DIAGNOSIS — L03116 Cellulitis of left lower limb: Secondary | ICD-10-CM | POA: Diagnosis not present

## 2016-05-17 DIAGNOSIS — L03116 Cellulitis of left lower limb: Secondary | ICD-10-CM | POA: Diagnosis not present

## 2016-05-18 DIAGNOSIS — L03116 Cellulitis of left lower limb: Secondary | ICD-10-CM | POA: Diagnosis not present

## 2016-05-19 DIAGNOSIS — L03116 Cellulitis of left lower limb: Secondary | ICD-10-CM | POA: Diagnosis not present

## 2016-05-20 DIAGNOSIS — L03116 Cellulitis of left lower limb: Secondary | ICD-10-CM | POA: Diagnosis not present

## 2016-05-21 DIAGNOSIS — L97222 Non-pressure chronic ulcer of left calf with fat layer exposed: Secondary | ICD-10-CM | POA: Diagnosis not present

## 2016-05-21 DIAGNOSIS — L97128 Non-pressure chronic ulcer of left thigh with other specified severity: Secondary | ICD-10-CM | POA: Diagnosis not present

## 2016-05-21 DIAGNOSIS — L03116 Cellulitis of left lower limb: Secondary | ICD-10-CM | POA: Diagnosis not present

## 2016-05-21 DIAGNOSIS — I872 Venous insufficiency (chronic) (peripheral): Secondary | ICD-10-CM | POA: Diagnosis not present

## 2016-05-22 DIAGNOSIS — Z01810 Encounter for preprocedural cardiovascular examination: Secondary | ICD-10-CM | POA: Diagnosis not present

## 2016-05-22 DIAGNOSIS — R0602 Shortness of breath: Secondary | ICD-10-CM | POA: Diagnosis not present

## 2016-05-22 DIAGNOSIS — L03116 Cellulitis of left lower limb: Secondary | ICD-10-CM | POA: Diagnosis not present

## 2016-05-23 DIAGNOSIS — L03116 Cellulitis of left lower limb: Secondary | ICD-10-CM | POA: Diagnosis not present

## 2016-05-24 DIAGNOSIS — L03116 Cellulitis of left lower limb: Secondary | ICD-10-CM | POA: Diagnosis not present

## 2016-05-24 DIAGNOSIS — J189 Pneumonia, unspecified organism: Secondary | ICD-10-CM | POA: Diagnosis not present

## 2016-05-25 DIAGNOSIS — Z48817 Encounter for surgical aftercare following surgery on the skin and subcutaneous tissue: Secondary | ICD-10-CM | POA: Diagnosis not present

## 2016-05-25 DIAGNOSIS — G894 Chronic pain syndrome: Secondary | ICD-10-CM | POA: Diagnosis not present

## 2016-05-25 DIAGNOSIS — E1140 Type 2 diabetes mellitus with diabetic neuropathy, unspecified: Secondary | ICD-10-CM | POA: Diagnosis not present

## 2016-05-25 DIAGNOSIS — E11622 Type 2 diabetes mellitus with other skin ulcer: Secondary | ICD-10-CM | POA: Diagnosis not present

## 2016-05-25 DIAGNOSIS — I509 Heart failure, unspecified: Secondary | ICD-10-CM | POA: Diagnosis not present

## 2016-05-25 DIAGNOSIS — J449 Chronic obstructive pulmonary disease, unspecified: Secondary | ICD-10-CM | POA: Diagnosis not present

## 2016-05-25 DIAGNOSIS — L97929 Non-pressure chronic ulcer of unspecified part of left lower leg with unspecified severity: Secondary | ICD-10-CM | POA: Diagnosis not present

## 2016-05-25 DIAGNOSIS — J301 Allergic rhinitis due to pollen: Secondary | ICD-10-CM | POA: Diagnosis not present

## 2016-05-25 DIAGNOSIS — M1990 Unspecified osteoarthritis, unspecified site: Secondary | ICD-10-CM | POA: Diagnosis not present

## 2016-05-25 DIAGNOSIS — M6281 Muscle weakness (generalized): Secondary | ICD-10-CM | POA: Diagnosis not present

## 2016-05-25 DIAGNOSIS — M62838 Other muscle spasm: Secondary | ICD-10-CM | POA: Diagnosis not present

## 2016-05-25 DIAGNOSIS — R609 Edema, unspecified: Secondary | ICD-10-CM | POA: Diagnosis not present

## 2016-05-25 DIAGNOSIS — I10 Essential (primary) hypertension: Secondary | ICD-10-CM | POA: Diagnosis not present

## 2016-05-25 DIAGNOSIS — E669 Obesity, unspecified: Secondary | ICD-10-CM | POA: Diagnosis not present

## 2016-05-25 DIAGNOSIS — R69 Illness, unspecified: Secondary | ICD-10-CM | POA: Diagnosis not present

## 2016-05-25 DIAGNOSIS — L03116 Cellulitis of left lower limb: Secondary | ICD-10-CM | POA: Diagnosis not present

## 2016-05-25 DIAGNOSIS — G2581 Restless legs syndrome: Secondary | ICD-10-CM | POA: Diagnosis not present

## 2016-05-25 DIAGNOSIS — F419 Anxiety disorder, unspecified: Secondary | ICD-10-CM | POA: Diagnosis not present

## 2016-05-25 DIAGNOSIS — R262 Difficulty in walking, not elsewhere classified: Secondary | ICD-10-CM | POA: Diagnosis not present

## 2016-05-25 DIAGNOSIS — I872 Venous insufficiency (chronic) (peripheral): Secondary | ICD-10-CM | POA: Diagnosis not present

## 2016-05-25 DIAGNOSIS — L97909 Non-pressure chronic ulcer of unspecified part of unspecified lower leg with unspecified severity: Secondary | ICD-10-CM | POA: Diagnosis not present

## 2016-05-25 DIAGNOSIS — L03115 Cellulitis of right lower limb: Secondary | ICD-10-CM | POA: Diagnosis not present

## 2016-05-25 DIAGNOSIS — G4733 Obstructive sleep apnea (adult) (pediatric): Secondary | ICD-10-CM | POA: Diagnosis not present

## 2016-05-25 DIAGNOSIS — F339 Major depressive disorder, recurrent, unspecified: Secondary | ICD-10-CM | POA: Diagnosis not present

## 2016-05-25 DIAGNOSIS — E039 Hypothyroidism, unspecified: Secondary | ICD-10-CM | POA: Diagnosis not present

## 2016-05-25 DIAGNOSIS — K219 Gastro-esophageal reflux disease without esophagitis: Secondary | ICD-10-CM | POA: Diagnosis not present

## 2016-05-28 DIAGNOSIS — E11622 Type 2 diabetes mellitus with other skin ulcer: Secondary | ICD-10-CM | POA: Diagnosis not present

## 2016-05-28 DIAGNOSIS — L97909 Non-pressure chronic ulcer of unspecified part of unspecified lower leg with unspecified severity: Secondary | ICD-10-CM | POA: Diagnosis not present

## 2016-05-28 DIAGNOSIS — I10 Essential (primary) hypertension: Secondary | ICD-10-CM | POA: Diagnosis not present

## 2016-05-28 DIAGNOSIS — J301 Allergic rhinitis due to pollen: Secondary | ICD-10-CM | POA: Diagnosis not present

## 2016-05-29 DIAGNOSIS — I872 Venous insufficiency (chronic) (peripheral): Secondary | ICD-10-CM | POA: Diagnosis not present

## 2016-06-01 DIAGNOSIS — I872 Venous insufficiency (chronic) (peripheral): Secondary | ICD-10-CM | POA: Diagnosis not present

## 2016-06-05 DIAGNOSIS — I872 Venous insufficiency (chronic) (peripheral): Secondary | ICD-10-CM | POA: Diagnosis not present

## 2016-06-06 DIAGNOSIS — I10 Essential (primary) hypertension: Secondary | ICD-10-CM | POA: Diagnosis not present

## 2016-06-06 DIAGNOSIS — L03116 Cellulitis of left lower limb: Secondary | ICD-10-CM | POA: Diagnosis not present

## 2016-06-06 DIAGNOSIS — M6281 Muscle weakness (generalized): Secondary | ICD-10-CM | POA: Diagnosis not present

## 2016-06-08 DIAGNOSIS — I872 Venous insufficiency (chronic) (peripheral): Secondary | ICD-10-CM | POA: Diagnosis not present

## 2016-06-11 DIAGNOSIS — I872 Venous insufficiency (chronic) (peripheral): Secondary | ICD-10-CM | POA: Diagnosis not present

## 2016-06-14 DIAGNOSIS — F419 Anxiety disorder, unspecified: Secondary | ICD-10-CM | POA: Diagnosis not present

## 2016-06-14 DIAGNOSIS — R69 Illness, unspecified: Secondary | ICD-10-CM | POA: Diagnosis not present

## 2016-06-15 DIAGNOSIS — L03115 Cellulitis of right lower limb: Secondary | ICD-10-CM | POA: Diagnosis not present

## 2016-06-18 DIAGNOSIS — I872 Venous insufficiency (chronic) (peripheral): Secondary | ICD-10-CM | POA: Diagnosis not present

## 2016-06-20 DIAGNOSIS — I890 Lymphedema, not elsewhere classified: Secondary | ICD-10-CM | POA: Diagnosis not present

## 2016-06-20 DIAGNOSIS — Z794 Long term (current) use of insulin: Secondary | ICD-10-CM | POA: Diagnosis not present

## 2016-06-20 DIAGNOSIS — E119 Type 2 diabetes mellitus without complications: Secondary | ICD-10-CM | POA: Diagnosis not present

## 2016-06-20 DIAGNOSIS — Z48817 Encounter for surgical aftercare following surgery on the skin and subcutaneous tissue: Secondary | ICD-10-CM | POA: Diagnosis not present

## 2016-06-20 DIAGNOSIS — Z853 Personal history of malignant neoplasm of breast: Secondary | ICD-10-CM | POA: Diagnosis not present

## 2016-06-20 DIAGNOSIS — I872 Venous insufficiency (chronic) (peripheral): Secondary | ICD-10-CM | POA: Diagnosis not present

## 2016-06-20 DIAGNOSIS — I10 Essential (primary) hypertension: Secondary | ICD-10-CM | POA: Diagnosis not present

## 2016-06-21 DIAGNOSIS — Z853 Personal history of malignant neoplasm of breast: Secondary | ICD-10-CM | POA: Diagnosis not present

## 2016-06-21 DIAGNOSIS — Z794 Long term (current) use of insulin: Secondary | ICD-10-CM | POA: Diagnosis not present

## 2016-06-21 DIAGNOSIS — I10 Essential (primary) hypertension: Secondary | ICD-10-CM | POA: Diagnosis not present

## 2016-06-21 DIAGNOSIS — E119 Type 2 diabetes mellitus without complications: Secondary | ICD-10-CM | POA: Diagnosis not present

## 2016-06-21 DIAGNOSIS — I890 Lymphedema, not elsewhere classified: Secondary | ICD-10-CM | POA: Diagnosis not present

## 2016-06-21 DIAGNOSIS — I872 Venous insufficiency (chronic) (peripheral): Secondary | ICD-10-CM | POA: Diagnosis not present

## 2016-06-21 DIAGNOSIS — Z48817 Encounter for surgical aftercare following surgery on the skin and subcutaneous tissue: Secondary | ICD-10-CM | POA: Diagnosis not present

## 2016-06-22 DIAGNOSIS — I872 Venous insufficiency (chronic) (peripheral): Secondary | ICD-10-CM | POA: Diagnosis not present

## 2016-06-22 DIAGNOSIS — Z48817 Encounter for surgical aftercare following surgery on the skin and subcutaneous tissue: Secondary | ICD-10-CM | POA: Diagnosis not present

## 2016-06-22 DIAGNOSIS — I10 Essential (primary) hypertension: Secondary | ICD-10-CM | POA: Diagnosis not present

## 2016-06-22 DIAGNOSIS — Z794 Long term (current) use of insulin: Secondary | ICD-10-CM | POA: Diagnosis not present

## 2016-06-22 DIAGNOSIS — I890 Lymphedema, not elsewhere classified: Secondary | ICD-10-CM | POA: Diagnosis not present

## 2016-06-22 DIAGNOSIS — Z853 Personal history of malignant neoplasm of breast: Secondary | ICD-10-CM | POA: Diagnosis not present

## 2016-06-22 DIAGNOSIS — E119 Type 2 diabetes mellitus without complications: Secondary | ICD-10-CM | POA: Diagnosis not present

## 2016-06-25 DIAGNOSIS — I10 Essential (primary) hypertension: Secondary | ICD-10-CM | POA: Diagnosis not present

## 2016-06-25 DIAGNOSIS — E119 Type 2 diabetes mellitus without complications: Secondary | ICD-10-CM | POA: Diagnosis not present

## 2016-06-25 DIAGNOSIS — Z794 Long term (current) use of insulin: Secondary | ICD-10-CM | POA: Diagnosis not present

## 2016-06-25 DIAGNOSIS — Z853 Personal history of malignant neoplasm of breast: Secondary | ICD-10-CM | POA: Diagnosis not present

## 2016-06-25 DIAGNOSIS — Z48817 Encounter for surgical aftercare following surgery on the skin and subcutaneous tissue: Secondary | ICD-10-CM | POA: Diagnosis not present

## 2016-06-25 DIAGNOSIS — I890 Lymphedema, not elsewhere classified: Secondary | ICD-10-CM | POA: Diagnosis not present

## 2016-06-25 DIAGNOSIS — I872 Venous insufficiency (chronic) (peripheral): Secondary | ICD-10-CM | POA: Diagnosis not present

## 2016-06-26 DIAGNOSIS — Z794 Long term (current) use of insulin: Secondary | ICD-10-CM | POA: Diagnosis not present

## 2016-06-26 DIAGNOSIS — I872 Venous insufficiency (chronic) (peripheral): Secondary | ICD-10-CM | POA: Diagnosis not present

## 2016-06-26 DIAGNOSIS — I890 Lymphedema, not elsewhere classified: Secondary | ICD-10-CM | POA: Diagnosis not present

## 2016-06-26 DIAGNOSIS — I10 Essential (primary) hypertension: Secondary | ICD-10-CM | POA: Diagnosis not present

## 2016-06-26 DIAGNOSIS — Z48817 Encounter for surgical aftercare following surgery on the skin and subcutaneous tissue: Secondary | ICD-10-CM | POA: Diagnosis not present

## 2016-06-26 DIAGNOSIS — Z853 Personal history of malignant neoplasm of breast: Secondary | ICD-10-CM | POA: Diagnosis not present

## 2016-06-26 DIAGNOSIS — E119 Type 2 diabetes mellitus without complications: Secondary | ICD-10-CM | POA: Diagnosis not present

## 2016-06-27 DIAGNOSIS — I872 Venous insufficiency (chronic) (peripheral): Secondary | ICD-10-CM | POA: Diagnosis not present

## 2016-06-29 DIAGNOSIS — I10 Essential (primary) hypertension: Secondary | ICD-10-CM | POA: Diagnosis not present

## 2016-06-29 DIAGNOSIS — Z48817 Encounter for surgical aftercare following surgery on the skin and subcutaneous tissue: Secondary | ICD-10-CM | POA: Diagnosis not present

## 2016-06-29 DIAGNOSIS — Z794 Long term (current) use of insulin: Secondary | ICD-10-CM | POA: Diagnosis not present

## 2016-06-29 DIAGNOSIS — Z853 Personal history of malignant neoplasm of breast: Secondary | ICD-10-CM | POA: Diagnosis not present

## 2016-06-29 DIAGNOSIS — E119 Type 2 diabetes mellitus without complications: Secondary | ICD-10-CM | POA: Diagnosis not present

## 2016-06-29 DIAGNOSIS — I890 Lymphedema, not elsewhere classified: Secondary | ICD-10-CM | POA: Diagnosis not present

## 2016-06-29 DIAGNOSIS — I872 Venous insufficiency (chronic) (peripheral): Secondary | ICD-10-CM | POA: Diagnosis not present

## 2016-07-02 DIAGNOSIS — I10 Essential (primary) hypertension: Secondary | ICD-10-CM | POA: Diagnosis not present

## 2016-07-02 DIAGNOSIS — I890 Lymphedema, not elsewhere classified: Secondary | ICD-10-CM | POA: Diagnosis not present

## 2016-07-02 DIAGNOSIS — E119 Type 2 diabetes mellitus without complications: Secondary | ICD-10-CM | POA: Diagnosis not present

## 2016-07-02 DIAGNOSIS — Z48817 Encounter for surgical aftercare following surgery on the skin and subcutaneous tissue: Secondary | ICD-10-CM | POA: Diagnosis not present

## 2016-07-02 DIAGNOSIS — I872 Venous insufficiency (chronic) (peripheral): Secondary | ICD-10-CM | POA: Diagnosis not present

## 2016-07-02 DIAGNOSIS — Z853 Personal history of malignant neoplasm of breast: Secondary | ICD-10-CM | POA: Diagnosis not present

## 2016-07-02 DIAGNOSIS — Z794 Long term (current) use of insulin: Secondary | ICD-10-CM | POA: Diagnosis not present

## 2016-07-03 DIAGNOSIS — I10 Essential (primary) hypertension: Secondary | ICD-10-CM | POA: Diagnosis not present

## 2016-07-03 DIAGNOSIS — Z853 Personal history of malignant neoplasm of breast: Secondary | ICD-10-CM | POA: Diagnosis not present

## 2016-07-03 DIAGNOSIS — I872 Venous insufficiency (chronic) (peripheral): Secondary | ICD-10-CM | POA: Diagnosis not present

## 2016-07-03 DIAGNOSIS — Z794 Long term (current) use of insulin: Secondary | ICD-10-CM | POA: Diagnosis not present

## 2016-07-03 DIAGNOSIS — Z48817 Encounter for surgical aftercare following surgery on the skin and subcutaneous tissue: Secondary | ICD-10-CM | POA: Diagnosis not present

## 2016-07-03 DIAGNOSIS — I83022 Varicose veins of left lower extremity with ulcer of calf: Secondary | ICD-10-CM | POA: Diagnosis not present

## 2016-07-03 DIAGNOSIS — L97309 Non-pressure chronic ulcer of unspecified ankle with unspecified severity: Secondary | ICD-10-CM | POA: Diagnosis not present

## 2016-07-03 DIAGNOSIS — I890 Lymphedema, not elsewhere classified: Secondary | ICD-10-CM | POA: Diagnosis not present

## 2016-07-03 DIAGNOSIS — E119 Type 2 diabetes mellitus without complications: Secondary | ICD-10-CM | POA: Diagnosis not present

## 2016-07-04 DIAGNOSIS — E119 Type 2 diabetes mellitus without complications: Secondary | ICD-10-CM | POA: Diagnosis not present

## 2016-07-04 DIAGNOSIS — I10 Essential (primary) hypertension: Secondary | ICD-10-CM | POA: Diagnosis not present

## 2016-07-04 DIAGNOSIS — Z853 Personal history of malignant neoplasm of breast: Secondary | ICD-10-CM | POA: Diagnosis not present

## 2016-07-04 DIAGNOSIS — I872 Venous insufficiency (chronic) (peripheral): Secondary | ICD-10-CM | POA: Diagnosis not present

## 2016-07-04 DIAGNOSIS — I890 Lymphedema, not elsewhere classified: Secondary | ICD-10-CM | POA: Diagnosis not present

## 2016-07-04 DIAGNOSIS — Z794 Long term (current) use of insulin: Secondary | ICD-10-CM | POA: Diagnosis not present

## 2016-07-04 DIAGNOSIS — Z48817 Encounter for surgical aftercare following surgery on the skin and subcutaneous tissue: Secondary | ICD-10-CM | POA: Diagnosis not present

## 2016-07-05 DIAGNOSIS — Z09 Encounter for follow-up examination after completed treatment for conditions other than malignant neoplasm: Secondary | ICD-10-CM | POA: Diagnosis not present

## 2016-07-05 DIAGNOSIS — I872 Venous insufficiency (chronic) (peripheral): Secondary | ICD-10-CM | POA: Diagnosis not present

## 2016-07-08 DIAGNOSIS — I872 Venous insufficiency (chronic) (peripheral): Secondary | ICD-10-CM | POA: Diagnosis not present

## 2016-07-08 DIAGNOSIS — Z794 Long term (current) use of insulin: Secondary | ICD-10-CM | POA: Diagnosis not present

## 2016-07-08 DIAGNOSIS — Z853 Personal history of malignant neoplasm of breast: Secondary | ICD-10-CM | POA: Diagnosis not present

## 2016-07-08 DIAGNOSIS — E119 Type 2 diabetes mellitus without complications: Secondary | ICD-10-CM | POA: Diagnosis not present

## 2016-07-08 DIAGNOSIS — Z48817 Encounter for surgical aftercare following surgery on the skin and subcutaneous tissue: Secondary | ICD-10-CM | POA: Diagnosis not present

## 2016-07-08 DIAGNOSIS — I10 Essential (primary) hypertension: Secondary | ICD-10-CM | POA: Diagnosis not present

## 2016-07-08 DIAGNOSIS — I890 Lymphedema, not elsewhere classified: Secondary | ICD-10-CM | POA: Diagnosis not present

## 2016-07-09 DIAGNOSIS — E119 Type 2 diabetes mellitus without complications: Secondary | ICD-10-CM | POA: Diagnosis not present

## 2016-07-09 DIAGNOSIS — I872 Venous insufficiency (chronic) (peripheral): Secondary | ICD-10-CM | POA: Diagnosis not present

## 2016-07-09 DIAGNOSIS — I10 Essential (primary) hypertension: Secondary | ICD-10-CM | POA: Diagnosis not present

## 2016-07-09 DIAGNOSIS — Z853 Personal history of malignant neoplasm of breast: Secondary | ICD-10-CM | POA: Diagnosis not present

## 2016-07-09 DIAGNOSIS — Z794 Long term (current) use of insulin: Secondary | ICD-10-CM | POA: Diagnosis not present

## 2016-07-09 DIAGNOSIS — I890 Lymphedema, not elsewhere classified: Secondary | ICD-10-CM | POA: Diagnosis not present

## 2016-07-09 DIAGNOSIS — Z48817 Encounter for surgical aftercare following surgery on the skin and subcutaneous tissue: Secondary | ICD-10-CM | POA: Diagnosis not present

## 2016-07-12 DIAGNOSIS — I872 Venous insufficiency (chronic) (peripheral): Secondary | ICD-10-CM | POA: Diagnosis not present

## 2016-07-12 DIAGNOSIS — E119 Type 2 diabetes mellitus without complications: Secondary | ICD-10-CM | POA: Diagnosis not present

## 2016-07-12 DIAGNOSIS — Z48817 Encounter for surgical aftercare following surgery on the skin and subcutaneous tissue: Secondary | ICD-10-CM | POA: Diagnosis not present

## 2016-07-12 DIAGNOSIS — E1129 Type 2 diabetes mellitus with other diabetic kidney complication: Secondary | ICD-10-CM | POA: Diagnosis not present

## 2016-07-12 DIAGNOSIS — I10 Essential (primary) hypertension: Secondary | ICD-10-CM | POA: Diagnosis not present

## 2016-07-12 DIAGNOSIS — Z0000 Encounter for general adult medical examination without abnormal findings: Secondary | ICD-10-CM | POA: Diagnosis not present

## 2016-07-12 DIAGNOSIS — E1165 Type 2 diabetes mellitus with hyperglycemia: Secondary | ICD-10-CM | POA: Diagnosis not present

## 2016-07-12 DIAGNOSIS — E781 Pure hyperglyceridemia: Secondary | ICD-10-CM | POA: Diagnosis not present

## 2016-07-12 DIAGNOSIS — Z853 Personal history of malignant neoplasm of breast: Secondary | ICD-10-CM | POA: Diagnosis not present

## 2016-07-12 DIAGNOSIS — E039 Hypothyroidism, unspecified: Secondary | ICD-10-CM | POA: Diagnosis not present

## 2016-07-12 DIAGNOSIS — Z794 Long term (current) use of insulin: Secondary | ICD-10-CM | POA: Diagnosis not present

## 2016-07-12 DIAGNOSIS — I890 Lymphedema, not elsewhere classified: Secondary | ICD-10-CM | POA: Diagnosis not present

## 2016-07-13 DIAGNOSIS — Z853 Personal history of malignant neoplasm of breast: Secondary | ICD-10-CM | POA: Diagnosis not present

## 2016-07-13 DIAGNOSIS — I890 Lymphedema, not elsewhere classified: Secondary | ICD-10-CM | POA: Diagnosis not present

## 2016-07-13 DIAGNOSIS — I10 Essential (primary) hypertension: Secondary | ICD-10-CM | POA: Diagnosis not present

## 2016-07-13 DIAGNOSIS — I872 Venous insufficiency (chronic) (peripheral): Secondary | ICD-10-CM | POA: Diagnosis not present

## 2016-07-13 DIAGNOSIS — Z794 Long term (current) use of insulin: Secondary | ICD-10-CM | POA: Diagnosis not present

## 2016-07-13 DIAGNOSIS — E119 Type 2 diabetes mellitus without complications: Secondary | ICD-10-CM | POA: Diagnosis not present

## 2016-07-13 DIAGNOSIS — Z48817 Encounter for surgical aftercare following surgery on the skin and subcutaneous tissue: Secondary | ICD-10-CM | POA: Diagnosis not present

## 2016-07-16 DIAGNOSIS — Z794 Long term (current) use of insulin: Secondary | ICD-10-CM | POA: Diagnosis not present

## 2016-07-16 DIAGNOSIS — I872 Venous insufficiency (chronic) (peripheral): Secondary | ICD-10-CM | POA: Diagnosis not present

## 2016-07-16 DIAGNOSIS — I10 Essential (primary) hypertension: Secondary | ICD-10-CM | POA: Diagnosis not present

## 2016-07-16 DIAGNOSIS — Z48817 Encounter for surgical aftercare following surgery on the skin and subcutaneous tissue: Secondary | ICD-10-CM | POA: Diagnosis not present

## 2016-07-16 DIAGNOSIS — I890 Lymphedema, not elsewhere classified: Secondary | ICD-10-CM | POA: Diagnosis not present

## 2016-07-16 DIAGNOSIS — Z853 Personal history of malignant neoplasm of breast: Secondary | ICD-10-CM | POA: Diagnosis not present

## 2016-07-16 DIAGNOSIS — E119 Type 2 diabetes mellitus without complications: Secondary | ICD-10-CM | POA: Diagnosis not present

## 2016-07-17 DIAGNOSIS — Z794 Long term (current) use of insulin: Secondary | ICD-10-CM | POA: Diagnosis not present

## 2016-07-17 DIAGNOSIS — Z853 Personal history of malignant neoplasm of breast: Secondary | ICD-10-CM | POA: Diagnosis not present

## 2016-07-17 DIAGNOSIS — Z48817 Encounter for surgical aftercare following surgery on the skin and subcutaneous tissue: Secondary | ICD-10-CM | POA: Diagnosis not present

## 2016-07-17 DIAGNOSIS — I872 Venous insufficiency (chronic) (peripheral): Secondary | ICD-10-CM | POA: Diagnosis not present

## 2016-07-17 DIAGNOSIS — I10 Essential (primary) hypertension: Secondary | ICD-10-CM | POA: Diagnosis not present

## 2016-07-17 DIAGNOSIS — I890 Lymphedema, not elsewhere classified: Secondary | ICD-10-CM | POA: Diagnosis not present

## 2016-07-17 DIAGNOSIS — E119 Type 2 diabetes mellitus without complications: Secondary | ICD-10-CM | POA: Diagnosis not present

## 2016-07-18 DIAGNOSIS — Z853 Personal history of malignant neoplasm of breast: Secondary | ICD-10-CM | POA: Diagnosis not present

## 2016-07-18 DIAGNOSIS — E119 Type 2 diabetes mellitus without complications: Secondary | ICD-10-CM | POA: Diagnosis not present

## 2016-07-18 DIAGNOSIS — I890 Lymphedema, not elsewhere classified: Secondary | ICD-10-CM | POA: Diagnosis not present

## 2016-07-18 DIAGNOSIS — Z48817 Encounter for surgical aftercare following surgery on the skin and subcutaneous tissue: Secondary | ICD-10-CM | POA: Diagnosis not present

## 2016-07-18 DIAGNOSIS — I10 Essential (primary) hypertension: Secondary | ICD-10-CM | POA: Diagnosis not present

## 2016-07-18 DIAGNOSIS — I872 Venous insufficiency (chronic) (peripheral): Secondary | ICD-10-CM | POA: Diagnosis not present

## 2016-07-18 DIAGNOSIS — Z794 Long term (current) use of insulin: Secondary | ICD-10-CM | POA: Diagnosis not present

## 2016-07-23 DIAGNOSIS — Z48817 Encounter for surgical aftercare following surgery on the skin and subcutaneous tissue: Secondary | ICD-10-CM | POA: Diagnosis not present

## 2016-07-23 DIAGNOSIS — I890 Lymphedema, not elsewhere classified: Secondary | ICD-10-CM | POA: Diagnosis not present

## 2016-07-23 DIAGNOSIS — E119 Type 2 diabetes mellitus without complications: Secondary | ICD-10-CM | POA: Diagnosis not present

## 2016-07-23 DIAGNOSIS — I10 Essential (primary) hypertension: Secondary | ICD-10-CM | POA: Diagnosis not present

## 2016-07-23 DIAGNOSIS — Z794 Long term (current) use of insulin: Secondary | ICD-10-CM | POA: Diagnosis not present

## 2016-07-23 DIAGNOSIS — I872 Venous insufficiency (chronic) (peripheral): Secondary | ICD-10-CM | POA: Diagnosis not present

## 2016-07-23 DIAGNOSIS — Z853 Personal history of malignant neoplasm of breast: Secondary | ICD-10-CM | POA: Diagnosis not present

## 2016-07-24 DIAGNOSIS — E119 Type 2 diabetes mellitus without complications: Secondary | ICD-10-CM | POA: Diagnosis not present

## 2016-07-24 DIAGNOSIS — I872 Venous insufficiency (chronic) (peripheral): Secondary | ICD-10-CM | POA: Diagnosis not present

## 2016-07-24 DIAGNOSIS — I10 Essential (primary) hypertension: Secondary | ICD-10-CM | POA: Diagnosis not present

## 2016-07-24 DIAGNOSIS — Z853 Personal history of malignant neoplasm of breast: Secondary | ICD-10-CM | POA: Diagnosis not present

## 2016-07-24 DIAGNOSIS — I890 Lymphedema, not elsewhere classified: Secondary | ICD-10-CM | POA: Diagnosis not present

## 2016-07-24 DIAGNOSIS — Z794 Long term (current) use of insulin: Secondary | ICD-10-CM | POA: Diagnosis not present

## 2016-07-24 DIAGNOSIS — Z48817 Encounter for surgical aftercare following surgery on the skin and subcutaneous tissue: Secondary | ICD-10-CM | POA: Diagnosis not present

## 2016-07-31 DIAGNOSIS — I872 Venous insufficiency (chronic) (peripheral): Secondary | ICD-10-CM | POA: Diagnosis not present

## 2016-07-31 DIAGNOSIS — Z853 Personal history of malignant neoplasm of breast: Secondary | ICD-10-CM | POA: Diagnosis not present

## 2016-07-31 DIAGNOSIS — I890 Lymphedema, not elsewhere classified: Secondary | ICD-10-CM | POA: Diagnosis not present

## 2016-07-31 DIAGNOSIS — I10 Essential (primary) hypertension: Secondary | ICD-10-CM | POA: Diagnosis not present

## 2016-07-31 DIAGNOSIS — Z794 Long term (current) use of insulin: Secondary | ICD-10-CM | POA: Diagnosis not present

## 2016-07-31 DIAGNOSIS — E119 Type 2 diabetes mellitus without complications: Secondary | ICD-10-CM | POA: Diagnosis not present

## 2016-07-31 DIAGNOSIS — Z48817 Encounter for surgical aftercare following surgery on the skin and subcutaneous tissue: Secondary | ICD-10-CM | POA: Diagnosis not present

## 2016-08-02 DIAGNOSIS — I872 Venous insufficiency (chronic) (peripheral): Secondary | ICD-10-CM | POA: Diagnosis not present

## 2016-08-02 DIAGNOSIS — L97828 Non-pressure chronic ulcer of other part of left lower leg with other specified severity: Secondary | ICD-10-CM | POA: Diagnosis not present

## 2016-08-03 DIAGNOSIS — I10 Essential (primary) hypertension: Secondary | ICD-10-CM | POA: Diagnosis not present

## 2016-08-03 DIAGNOSIS — I890 Lymphedema, not elsewhere classified: Secondary | ICD-10-CM | POA: Diagnosis not present

## 2016-08-03 DIAGNOSIS — Z853 Personal history of malignant neoplasm of breast: Secondary | ICD-10-CM | POA: Diagnosis not present

## 2016-08-03 DIAGNOSIS — Z48817 Encounter for surgical aftercare following surgery on the skin and subcutaneous tissue: Secondary | ICD-10-CM | POA: Diagnosis not present

## 2016-08-03 DIAGNOSIS — Z794 Long term (current) use of insulin: Secondary | ICD-10-CM | POA: Diagnosis not present

## 2016-08-03 DIAGNOSIS — I872 Venous insufficiency (chronic) (peripheral): Secondary | ICD-10-CM | POA: Diagnosis not present

## 2016-08-03 DIAGNOSIS — E119 Type 2 diabetes mellitus without complications: Secondary | ICD-10-CM | POA: Diagnosis not present

## 2016-08-09 DIAGNOSIS — L97828 Non-pressure chronic ulcer of other part of left lower leg with other specified severity: Secondary | ICD-10-CM | POA: Diagnosis not present

## 2016-08-09 DIAGNOSIS — I872 Venous insufficiency (chronic) (peripheral): Secondary | ICD-10-CM | POA: Diagnosis not present

## 2016-08-11 DIAGNOSIS — R0602 Shortness of breath: Secondary | ICD-10-CM | POA: Diagnosis not present

## 2016-08-11 DIAGNOSIS — Z7984 Long term (current) use of oral hypoglycemic drugs: Secondary | ICD-10-CM | POA: Diagnosis not present

## 2016-08-11 DIAGNOSIS — Z0000 Encounter for general adult medical examination without abnormal findings: Secondary | ICD-10-CM | POA: Diagnosis not present

## 2016-08-11 DIAGNOSIS — N3941 Urge incontinence: Secondary | ICD-10-CM | POA: Diagnosis not present

## 2016-08-11 DIAGNOSIS — G4700 Insomnia, unspecified: Secondary | ICD-10-CM | POA: Diagnosis not present

## 2016-08-11 DIAGNOSIS — R69 Illness, unspecified: Secondary | ICD-10-CM | POA: Diagnosis not present

## 2016-08-11 DIAGNOSIS — E039 Hypothyroidism, unspecified: Secondary | ICD-10-CM | POA: Diagnosis not present

## 2016-08-11 DIAGNOSIS — E11628 Type 2 diabetes mellitus with other skin complications: Secondary | ICD-10-CM | POA: Diagnosis not present

## 2016-08-11 DIAGNOSIS — Z6841 Body Mass Index (BMI) 40.0 and over, adult: Secondary | ICD-10-CM | POA: Diagnosis not present

## 2016-08-11 DIAGNOSIS — Z7982 Long term (current) use of aspirin: Secondary | ICD-10-CM | POA: Diagnosis not present

## 2016-08-11 DIAGNOSIS — G2581 Restless legs syndrome: Secondary | ICD-10-CM | POA: Diagnosis not present

## 2016-08-11 DIAGNOSIS — L97229 Non-pressure chronic ulcer of left calf with unspecified severity: Secondary | ICD-10-CM | POA: Diagnosis not present

## 2016-08-11 DIAGNOSIS — Z9181 History of falling: Secondary | ICD-10-CM | POA: Diagnosis not present

## 2016-08-11 DIAGNOSIS — M62838 Other muscle spasm: Secondary | ICD-10-CM | POA: Diagnosis not present

## 2016-08-11 DIAGNOSIS — E1142 Type 2 diabetes mellitus with diabetic polyneuropathy: Secondary | ICD-10-CM | POA: Diagnosis not present

## 2016-08-11 DIAGNOSIS — K007 Teething syndrome: Secondary | ICD-10-CM | POA: Diagnosis not present

## 2016-08-11 DIAGNOSIS — E876 Hypokalemia: Secondary | ICD-10-CM | POA: Diagnosis not present

## 2016-08-11 DIAGNOSIS — E1165 Type 2 diabetes mellitus with hyperglycemia: Secondary | ICD-10-CM | POA: Diagnosis not present

## 2016-08-16 DIAGNOSIS — I872 Venous insufficiency (chronic) (peripheral): Secondary | ICD-10-CM | POA: Diagnosis not present

## 2016-08-16 DIAGNOSIS — L97828 Non-pressure chronic ulcer of other part of left lower leg with other specified severity: Secondary | ICD-10-CM | POA: Diagnosis not present

## 2016-08-22 DIAGNOSIS — R600 Localized edema: Secondary | ICD-10-CM | POA: Diagnosis not present

## 2016-08-22 DIAGNOSIS — E039 Hypothyroidism, unspecified: Secondary | ICD-10-CM | POA: Diagnosis not present

## 2016-08-22 DIAGNOSIS — I10 Essential (primary) hypertension: Secondary | ICD-10-CM | POA: Diagnosis not present

## 2016-08-22 DIAGNOSIS — I872 Venous insufficiency (chronic) (peripheral): Secondary | ICD-10-CM | POA: Diagnosis not present

## 2016-08-22 DIAGNOSIS — G2581 Restless legs syndrome: Secondary | ICD-10-CM | POA: Diagnosis not present

## 2016-08-22 DIAGNOSIS — Z6841 Body Mass Index (BMI) 40.0 and over, adult: Secondary | ICD-10-CM | POA: Diagnosis not present

## 2016-08-22 DIAGNOSIS — L97828 Non-pressure chronic ulcer of other part of left lower leg with other specified severity: Secondary | ICD-10-CM | POA: Diagnosis not present

## 2016-08-22 DIAGNOSIS — R252 Cramp and spasm: Secondary | ICD-10-CM | POA: Diagnosis not present

## 2016-08-22 DIAGNOSIS — E1165 Type 2 diabetes mellitus with hyperglycemia: Secondary | ICD-10-CM | POA: Diagnosis not present

## 2016-08-23 DIAGNOSIS — L97828 Non-pressure chronic ulcer of other part of left lower leg with other specified severity: Secondary | ICD-10-CM | POA: Diagnosis not present

## 2016-08-23 DIAGNOSIS — I872 Venous insufficiency (chronic) (peripheral): Secondary | ICD-10-CM | POA: Diagnosis not present

## 2016-08-30 DIAGNOSIS — Z9889 Other specified postprocedural states: Secondary | ICD-10-CM | POA: Diagnosis not present

## 2016-08-30 DIAGNOSIS — Z09 Encounter for follow-up examination after completed treatment for conditions other than malignant neoplasm: Secondary | ICD-10-CM | POA: Diagnosis not present

## 2016-08-30 DIAGNOSIS — I872 Venous insufficiency (chronic) (peripheral): Secondary | ICD-10-CM | POA: Diagnosis not present

## 2016-08-30 DIAGNOSIS — Z5189 Encounter for other specified aftercare: Secondary | ICD-10-CM | POA: Diagnosis not present

## 2016-10-03 DIAGNOSIS — R928 Other abnormal and inconclusive findings on diagnostic imaging of breast: Secondary | ICD-10-CM | POA: Diagnosis not present

## 2016-10-03 DIAGNOSIS — Z9889 Other specified postprocedural states: Secondary | ICD-10-CM | POA: Diagnosis not present

## 2016-10-03 DIAGNOSIS — Z853 Personal history of malignant neoplasm of breast: Secondary | ICD-10-CM | POA: Diagnosis not present

## 2016-10-03 DIAGNOSIS — N6489 Other specified disorders of breast: Secondary | ICD-10-CM | POA: Diagnosis not present

## 2016-10-16 DIAGNOSIS — Z6841 Body Mass Index (BMI) 40.0 and over, adult: Secondary | ICD-10-CM | POA: Diagnosis not present

## 2016-10-16 DIAGNOSIS — N6489 Other specified disorders of breast: Secondary | ICD-10-CM | POA: Diagnosis not present

## 2016-10-18 DIAGNOSIS — I872 Venous insufficiency (chronic) (peripheral): Secondary | ICD-10-CM | POA: Diagnosis not present

## 2016-10-18 DIAGNOSIS — L97228 Non-pressure chronic ulcer of left calf with other specified severity: Secondary | ICD-10-CM | POA: Diagnosis not present

## 2016-10-18 DIAGNOSIS — L97222 Non-pressure chronic ulcer of left calf with fat layer exposed: Secondary | ICD-10-CM | POA: Diagnosis not present

## 2016-10-25 DIAGNOSIS — I872 Venous insufficiency (chronic) (peripheral): Secondary | ICD-10-CM | POA: Diagnosis not present

## 2016-10-25 DIAGNOSIS — L97228 Non-pressure chronic ulcer of left calf with other specified severity: Secondary | ICD-10-CM | POA: Diagnosis not present

## 2016-10-25 DIAGNOSIS — L97222 Non-pressure chronic ulcer of left calf with fat layer exposed: Secondary | ICD-10-CM | POA: Diagnosis not present

## 2016-11-01 DIAGNOSIS — L97222 Non-pressure chronic ulcer of left calf with fat layer exposed: Secondary | ICD-10-CM | POA: Diagnosis not present

## 2016-11-01 DIAGNOSIS — L97228 Non-pressure chronic ulcer of left calf with other specified severity: Secondary | ICD-10-CM | POA: Diagnosis not present

## 2016-11-01 DIAGNOSIS — I872 Venous insufficiency (chronic) (peripheral): Secondary | ICD-10-CM | POA: Diagnosis not present

## 2016-11-06 DIAGNOSIS — L97228 Non-pressure chronic ulcer of left calf with other specified severity: Secondary | ICD-10-CM | POA: Diagnosis not present

## 2016-11-06 DIAGNOSIS — I872 Venous insufficiency (chronic) (peripheral): Secondary | ICD-10-CM | POA: Diagnosis not present

## 2016-11-06 DIAGNOSIS — L97222 Non-pressure chronic ulcer of left calf with fat layer exposed: Secondary | ICD-10-CM | POA: Diagnosis not present

## 2016-11-15 DIAGNOSIS — I872 Venous insufficiency (chronic) (peripheral): Secondary | ICD-10-CM | POA: Diagnosis not present

## 2016-11-15 DIAGNOSIS — L97228 Non-pressure chronic ulcer of left calf with other specified severity: Secondary | ICD-10-CM | POA: Diagnosis not present

## 2016-11-15 DIAGNOSIS — L97222 Non-pressure chronic ulcer of left calf with fat layer exposed: Secondary | ICD-10-CM | POA: Diagnosis not present

## 2016-11-20 DIAGNOSIS — R0789 Other chest pain: Secondary | ICD-10-CM | POA: Diagnosis not present

## 2016-11-20 DIAGNOSIS — L97222 Non-pressure chronic ulcer of left calf with fat layer exposed: Secondary | ICD-10-CM | POA: Diagnosis not present

## 2016-11-20 DIAGNOSIS — L97228 Non-pressure chronic ulcer of left calf with other specified severity: Secondary | ICD-10-CM | POA: Diagnosis not present

## 2016-11-20 DIAGNOSIS — I10 Essential (primary) hypertension: Secondary | ICD-10-CM | POA: Diagnosis not present

## 2016-11-20 DIAGNOSIS — I509 Heart failure, unspecified: Secondary | ICD-10-CM | POA: Diagnosis not present

## 2016-11-20 DIAGNOSIS — Z885 Allergy status to narcotic agent status: Secondary | ICD-10-CM | POA: Diagnosis not present

## 2016-11-20 DIAGNOSIS — I872 Venous insufficiency (chronic) (peripheral): Secondary | ICD-10-CM | POA: Diagnosis not present

## 2016-11-20 DIAGNOSIS — N390 Urinary tract infection, site not specified: Secondary | ICD-10-CM | POA: Diagnosis not present

## 2016-11-22 DIAGNOSIS — I872 Venous insufficiency (chronic) (peripheral): Secondary | ICD-10-CM | POA: Diagnosis not present

## 2016-11-22 DIAGNOSIS — L97228 Non-pressure chronic ulcer of left calf with other specified severity: Secondary | ICD-10-CM | POA: Diagnosis not present

## 2016-11-22 DIAGNOSIS — I878 Other specified disorders of veins: Secondary | ICD-10-CM | POA: Diagnosis not present

## 2016-11-22 DIAGNOSIS — Z9889 Other specified postprocedural states: Secondary | ICD-10-CM | POA: Diagnosis not present

## 2016-11-22 DIAGNOSIS — L97222 Non-pressure chronic ulcer of left calf with fat layer exposed: Secondary | ICD-10-CM | POA: Diagnosis not present

## 2016-11-22 DIAGNOSIS — Z5189 Encounter for other specified aftercare: Secondary | ICD-10-CM | POA: Diagnosis not present

## 2016-11-29 DIAGNOSIS — L97222 Non-pressure chronic ulcer of left calf with fat layer exposed: Secondary | ICD-10-CM | POA: Diagnosis not present

## 2016-11-29 DIAGNOSIS — I872 Venous insufficiency (chronic) (peripheral): Secondary | ICD-10-CM | POA: Diagnosis not present

## 2016-11-29 DIAGNOSIS — L97228 Non-pressure chronic ulcer of left calf with other specified severity: Secondary | ICD-10-CM | POA: Diagnosis not present

## 2016-12-14 DIAGNOSIS — L97228 Non-pressure chronic ulcer of left calf with other specified severity: Secondary | ICD-10-CM | POA: Diagnosis not present

## 2016-12-14 DIAGNOSIS — I872 Venous insufficiency (chronic) (peripheral): Secondary | ICD-10-CM | POA: Diagnosis not present

## 2016-12-14 DIAGNOSIS — L97222 Non-pressure chronic ulcer of left calf with fat layer exposed: Secondary | ICD-10-CM | POA: Diagnosis not present

## 2016-12-19 DIAGNOSIS — I872 Venous insufficiency (chronic) (peripheral): Secondary | ICD-10-CM | POA: Diagnosis not present

## 2016-12-19 DIAGNOSIS — L97222 Non-pressure chronic ulcer of left calf with fat layer exposed: Secondary | ICD-10-CM | POA: Diagnosis not present

## 2016-12-24 DIAGNOSIS — N183 Chronic kidney disease, stage 3 (moderate): Secondary | ICD-10-CM | POA: Diagnosis not present

## 2016-12-24 DIAGNOSIS — I509 Heart failure, unspecified: Secondary | ICD-10-CM | POA: Diagnosis not present

## 2016-12-24 DIAGNOSIS — R079 Chest pain, unspecified: Secondary | ICD-10-CM | POA: Diagnosis not present

## 2016-12-24 DIAGNOSIS — E039 Hypothyroidism, unspecified: Secondary | ICD-10-CM | POA: Diagnosis not present

## 2016-12-24 DIAGNOSIS — A4151 Sepsis due to Escherichia coli [E. coli]: Secondary | ICD-10-CM | POA: Diagnosis not present

## 2016-12-24 DIAGNOSIS — J9621 Acute and chronic respiratory failure with hypoxia: Secondary | ICD-10-CM | POA: Diagnosis not present

## 2016-12-24 DIAGNOSIS — J189 Pneumonia, unspecified organism: Secondary | ICD-10-CM | POA: Diagnosis not present

## 2016-12-24 DIAGNOSIS — M6281 Muscle weakness (generalized): Secondary | ICD-10-CM | POA: Diagnosis not present

## 2016-12-24 DIAGNOSIS — Z6841 Body Mass Index (BMI) 40.0 and over, adult: Secondary | ICD-10-CM | POA: Diagnosis not present

## 2016-12-24 DIAGNOSIS — G894 Chronic pain syndrome: Secondary | ICD-10-CM | POA: Diagnosis not present

## 2016-12-24 DIAGNOSIS — E1142 Type 2 diabetes mellitus with diabetic polyneuropathy: Secondary | ICD-10-CM | POA: Diagnosis not present

## 2016-12-24 DIAGNOSIS — I214 Non-ST elevation (NSTEMI) myocardial infarction: Secondary | ICD-10-CM | POA: Diagnosis not present

## 2016-12-24 DIAGNOSIS — N179 Acute kidney failure, unspecified: Secondary | ICD-10-CM | POA: Diagnosis not present

## 2016-12-24 DIAGNOSIS — I878 Other specified disorders of veins: Secondary | ICD-10-CM | POA: Diagnosis not present

## 2016-12-24 DIAGNOSIS — I10 Essential (primary) hypertension: Secondary | ICD-10-CM | POA: Diagnosis not present

## 2016-12-24 DIAGNOSIS — I21A1 Myocardial infarction type 2: Secondary | ICD-10-CM | POA: Diagnosis not present

## 2016-12-24 DIAGNOSIS — J9691 Respiratory failure, unspecified with hypoxia: Secondary | ICD-10-CM | POA: Diagnosis not present

## 2016-12-24 DIAGNOSIS — L97329 Non-pressure chronic ulcer of left ankle with unspecified severity: Secondary | ICD-10-CM | POA: Diagnosis not present

## 2016-12-24 DIAGNOSIS — I5021 Acute systolic (congestive) heart failure: Secondary | ICD-10-CM | POA: Diagnosis not present

## 2016-12-24 DIAGNOSIS — R262 Difficulty in walking, not elsewhere classified: Secondary | ICD-10-CM | POA: Diagnosis not present

## 2016-12-24 DIAGNOSIS — R7881 Bacteremia: Secondary | ICD-10-CM | POA: Diagnosis not present

## 2016-12-24 DIAGNOSIS — I110 Hypertensive heart disease with heart failure: Secondary | ICD-10-CM | POA: Diagnosis not present

## 2016-12-24 DIAGNOSIS — R0602 Shortness of breath: Secondary | ICD-10-CM | POA: Diagnosis not present

## 2016-12-24 DIAGNOSIS — E1140 Type 2 diabetes mellitus with diabetic neuropathy, unspecified: Secondary | ICD-10-CM | POA: Diagnosis not present

## 2016-12-24 DIAGNOSIS — J90 Pleural effusion, not elsewhere classified: Secondary | ICD-10-CM | POA: Diagnosis not present

## 2016-12-24 DIAGNOSIS — J9601 Acute respiratory failure with hypoxia: Secondary | ICD-10-CM | POA: Diagnosis not present

## 2016-12-30 DIAGNOSIS — E1140 Type 2 diabetes mellitus with diabetic neuropathy, unspecified: Secondary | ICD-10-CM | POA: Diagnosis not present

## 2016-12-30 DIAGNOSIS — N179 Acute kidney failure, unspecified: Secondary | ICD-10-CM | POA: Diagnosis not present

## 2016-12-30 DIAGNOSIS — G894 Chronic pain syndrome: Secondary | ICD-10-CM | POA: Diagnosis not present

## 2016-12-30 DIAGNOSIS — R262 Difficulty in walking, not elsewhere classified: Secondary | ICD-10-CM | POA: Diagnosis not present

## 2016-12-30 DIAGNOSIS — J9601 Acute respiratory failure with hypoxia: Secondary | ICD-10-CM | POA: Diagnosis not present

## 2016-12-30 DIAGNOSIS — Z452 Encounter for adjustment and management of vascular access device: Secondary | ICD-10-CM | POA: Diagnosis not present

## 2016-12-30 DIAGNOSIS — G63 Polyneuropathy in diseases classified elsewhere: Secondary | ICD-10-CM | POA: Diagnosis not present

## 2016-12-30 DIAGNOSIS — I872 Venous insufficiency (chronic) (peripheral): Secondary | ICD-10-CM | POA: Diagnosis not present

## 2016-12-30 DIAGNOSIS — M6281 Muscle weakness (generalized): Secondary | ICD-10-CM | POA: Diagnosis not present

## 2016-12-30 DIAGNOSIS — L97222 Non-pressure chronic ulcer of left calf with fat layer exposed: Secondary | ICD-10-CM | POA: Diagnosis not present

## 2016-12-30 DIAGNOSIS — N390 Urinary tract infection, site not specified: Secondary | ICD-10-CM | POA: Diagnosis not present

## 2016-12-30 DIAGNOSIS — I214 Non-ST elevation (NSTEMI) myocardial infarction: Secondary | ICD-10-CM | POA: Diagnosis not present

## 2016-12-30 DIAGNOSIS — J9621 Acute and chronic respiratory failure with hypoxia: Secondary | ICD-10-CM | POA: Diagnosis not present

## 2016-12-30 DIAGNOSIS — E039 Hypothyroidism, unspecified: Secondary | ICD-10-CM | POA: Diagnosis not present

## 2016-12-30 DIAGNOSIS — R69 Illness, unspecified: Secondary | ICD-10-CM | POA: Diagnosis not present

## 2016-12-30 DIAGNOSIS — I509 Heart failure, unspecified: Secondary | ICD-10-CM | POA: Diagnosis not present

## 2016-12-30 DIAGNOSIS — I10 Essential (primary) hypertension: Secondary | ICD-10-CM | POA: Diagnosis not present

## 2016-12-30 DIAGNOSIS — L97228 Non-pressure chronic ulcer of left calf with other specified severity: Secondary | ICD-10-CM | POA: Diagnosis not present

## 2016-12-30 DIAGNOSIS — J90 Pleural effusion, not elsewhere classified: Secondary | ICD-10-CM | POA: Diagnosis not present

## 2016-12-30 DIAGNOSIS — A419 Sepsis, unspecified organism: Secondary | ICD-10-CM | POA: Diagnosis not present

## 2017-01-01 DIAGNOSIS — G63 Polyneuropathy in diseases classified elsewhere: Secondary | ICD-10-CM | POA: Diagnosis not present

## 2017-01-01 DIAGNOSIS — E039 Hypothyroidism, unspecified: Secondary | ICD-10-CM | POA: Diagnosis not present

## 2017-01-01 DIAGNOSIS — N390 Urinary tract infection, site not specified: Secondary | ICD-10-CM | POA: Diagnosis not present

## 2017-01-01 DIAGNOSIS — A419 Sepsis, unspecified organism: Secondary | ICD-10-CM | POA: Diagnosis not present

## 2017-01-02 DIAGNOSIS — J9601 Acute respiratory failure with hypoxia: Secondary | ICD-10-CM | POA: Diagnosis not present

## 2017-01-02 DIAGNOSIS — N179 Acute kidney failure, unspecified: Secondary | ICD-10-CM | POA: Diagnosis not present

## 2017-01-02 DIAGNOSIS — I509 Heart failure, unspecified: Secondary | ICD-10-CM | POA: Diagnosis not present

## 2017-01-02 DIAGNOSIS — G894 Chronic pain syndrome: Secondary | ICD-10-CM | POA: Diagnosis not present

## 2017-01-14 DIAGNOSIS — L97228 Non-pressure chronic ulcer of left calf with other specified severity: Secondary | ICD-10-CM | POA: Diagnosis not present

## 2017-01-14 DIAGNOSIS — I872 Venous insufficiency (chronic) (peripheral): Secondary | ICD-10-CM | POA: Diagnosis not present

## 2017-01-14 DIAGNOSIS — L97222 Non-pressure chronic ulcer of left calf with fat layer exposed: Secondary | ICD-10-CM | POA: Diagnosis not present

## 2017-01-20 DIAGNOSIS — I872 Venous insufficiency (chronic) (peripheral): Secondary | ICD-10-CM | POA: Diagnosis not present

## 2017-01-20 DIAGNOSIS — J449 Chronic obstructive pulmonary disease, unspecified: Secondary | ICD-10-CM | POA: Diagnosis not present

## 2017-01-20 DIAGNOSIS — I509 Heart failure, unspecified: Secondary | ICD-10-CM | POA: Diagnosis not present

## 2017-01-20 DIAGNOSIS — E119 Type 2 diabetes mellitus without complications: Secondary | ICD-10-CM | POA: Diagnosis not present

## 2017-01-20 DIAGNOSIS — L97228 Non-pressure chronic ulcer of left calf with other specified severity: Secondary | ICD-10-CM | POA: Diagnosis not present

## 2017-01-20 DIAGNOSIS — I214 Non-ST elevation (NSTEMI) myocardial infarction: Secondary | ICD-10-CM | POA: Diagnosis not present

## 2017-01-20 DIAGNOSIS — Z8572 Personal history of non-Hodgkin lymphomas: Secondary | ICD-10-CM | POA: Diagnosis not present

## 2017-01-20 DIAGNOSIS — L97222 Non-pressure chronic ulcer of left calf with fat layer exposed: Secondary | ICD-10-CM | POA: Diagnosis not present

## 2017-01-20 DIAGNOSIS — E1151 Type 2 diabetes mellitus with diabetic peripheral angiopathy without gangrene: Secondary | ICD-10-CM | POA: Diagnosis not present

## 2017-01-20 DIAGNOSIS — L97221 Non-pressure chronic ulcer of left calf limited to breakdown of skin: Secondary | ICD-10-CM | POA: Diagnosis not present

## 2017-01-20 DIAGNOSIS — Z79891 Long term (current) use of opiate analgesic: Secondary | ICD-10-CM | POA: Diagnosis not present

## 2017-01-20 DIAGNOSIS — G4733 Obstructive sleep apnea (adult) (pediatric): Secondary | ICD-10-CM | POA: Diagnosis not present

## 2017-01-20 DIAGNOSIS — R69 Illness, unspecified: Secondary | ICD-10-CM | POA: Diagnosis not present

## 2017-01-20 DIAGNOSIS — Z87440 Personal history of urinary (tract) infections: Secondary | ICD-10-CM | POA: Diagnosis not present

## 2017-01-20 DIAGNOSIS — G2581 Restless legs syndrome: Secondary | ICD-10-CM | POA: Diagnosis not present

## 2017-01-20 DIAGNOSIS — E11622 Type 2 diabetes mellitus with other skin ulcer: Secondary | ICD-10-CM | POA: Diagnosis not present

## 2017-01-20 DIAGNOSIS — Z794 Long term (current) use of insulin: Secondary | ICD-10-CM | POA: Diagnosis not present

## 2017-01-20 DIAGNOSIS — Z853 Personal history of malignant neoplasm of breast: Secondary | ICD-10-CM | POA: Diagnosis not present

## 2017-01-21 DIAGNOSIS — E119 Type 2 diabetes mellitus without complications: Secondary | ICD-10-CM | POA: Diagnosis not present

## 2017-01-21 DIAGNOSIS — L97228 Non-pressure chronic ulcer of left calf with other specified severity: Secondary | ICD-10-CM | POA: Diagnosis not present

## 2017-01-21 DIAGNOSIS — G2581 Restless legs syndrome: Secondary | ICD-10-CM | POA: Diagnosis not present

## 2017-01-21 DIAGNOSIS — I214 Non-ST elevation (NSTEMI) myocardial infarction: Secondary | ICD-10-CM | POA: Diagnosis not present

## 2017-01-21 DIAGNOSIS — Z8572 Personal history of non-Hodgkin lymphomas: Secondary | ICD-10-CM | POA: Diagnosis not present

## 2017-01-21 DIAGNOSIS — I509 Heart failure, unspecified: Secondary | ICD-10-CM | POA: Diagnosis not present

## 2017-01-21 DIAGNOSIS — G4733 Obstructive sleep apnea (adult) (pediatric): Secondary | ICD-10-CM | POA: Diagnosis not present

## 2017-01-21 DIAGNOSIS — J449 Chronic obstructive pulmonary disease, unspecified: Secondary | ICD-10-CM | POA: Diagnosis not present

## 2017-01-21 DIAGNOSIS — I872 Venous insufficiency (chronic) (peripheral): Secondary | ICD-10-CM | POA: Diagnosis not present

## 2017-01-21 DIAGNOSIS — R69 Illness, unspecified: Secondary | ICD-10-CM | POA: Diagnosis not present

## 2017-01-23 DIAGNOSIS — J449 Chronic obstructive pulmonary disease, unspecified: Secondary | ICD-10-CM | POA: Diagnosis not present

## 2017-01-23 DIAGNOSIS — R69 Illness, unspecified: Secondary | ICD-10-CM | POA: Diagnosis not present

## 2017-01-23 DIAGNOSIS — Z8572 Personal history of non-Hodgkin lymphomas: Secondary | ICD-10-CM | POA: Diagnosis not present

## 2017-01-23 DIAGNOSIS — G2581 Restless legs syndrome: Secondary | ICD-10-CM | POA: Diagnosis not present

## 2017-01-23 DIAGNOSIS — L97228 Non-pressure chronic ulcer of left calf with other specified severity: Secondary | ICD-10-CM | POA: Diagnosis not present

## 2017-01-23 DIAGNOSIS — I872 Venous insufficiency (chronic) (peripheral): Secondary | ICD-10-CM | POA: Diagnosis not present

## 2017-01-23 DIAGNOSIS — E119 Type 2 diabetes mellitus without complications: Secondary | ICD-10-CM | POA: Diagnosis not present

## 2017-01-23 DIAGNOSIS — I214 Non-ST elevation (NSTEMI) myocardial infarction: Secondary | ICD-10-CM | POA: Diagnosis not present

## 2017-01-23 DIAGNOSIS — I509 Heart failure, unspecified: Secondary | ICD-10-CM | POA: Diagnosis not present

## 2017-01-23 DIAGNOSIS — G4733 Obstructive sleep apnea (adult) (pediatric): Secondary | ICD-10-CM | POA: Diagnosis not present

## 2017-01-24 DIAGNOSIS — E119 Type 2 diabetes mellitus without complications: Secondary | ICD-10-CM | POA: Diagnosis not present

## 2017-01-24 DIAGNOSIS — I872 Venous insufficiency (chronic) (peripheral): Secondary | ICD-10-CM | POA: Diagnosis not present

## 2017-01-24 DIAGNOSIS — Z8572 Personal history of non-Hodgkin lymphomas: Secondary | ICD-10-CM | POA: Diagnosis not present

## 2017-01-24 DIAGNOSIS — G4733 Obstructive sleep apnea (adult) (pediatric): Secondary | ICD-10-CM | POA: Diagnosis not present

## 2017-01-24 DIAGNOSIS — G2581 Restless legs syndrome: Secondary | ICD-10-CM | POA: Diagnosis not present

## 2017-01-24 DIAGNOSIS — L97228 Non-pressure chronic ulcer of left calf with other specified severity: Secondary | ICD-10-CM | POA: Diagnosis not present

## 2017-01-24 DIAGNOSIS — J449 Chronic obstructive pulmonary disease, unspecified: Secondary | ICD-10-CM | POA: Diagnosis not present

## 2017-01-24 DIAGNOSIS — I509 Heart failure, unspecified: Secondary | ICD-10-CM | POA: Diagnosis not present

## 2017-01-24 DIAGNOSIS — R69 Illness, unspecified: Secondary | ICD-10-CM | POA: Diagnosis not present

## 2017-01-24 DIAGNOSIS — I214 Non-ST elevation (NSTEMI) myocardial infarction: Secondary | ICD-10-CM | POA: Diagnosis not present

## 2017-01-29 DIAGNOSIS — I214 Non-ST elevation (NSTEMI) myocardial infarction: Secondary | ICD-10-CM | POA: Diagnosis not present

## 2017-01-29 DIAGNOSIS — L97228 Non-pressure chronic ulcer of left calf with other specified severity: Secondary | ICD-10-CM | POA: Diagnosis not present

## 2017-01-29 DIAGNOSIS — R69 Illness, unspecified: Secondary | ICD-10-CM | POA: Diagnosis not present

## 2017-01-29 DIAGNOSIS — Z8572 Personal history of non-Hodgkin lymphomas: Secondary | ICD-10-CM | POA: Diagnosis not present

## 2017-01-29 DIAGNOSIS — I872 Venous insufficiency (chronic) (peripheral): Secondary | ICD-10-CM | POA: Diagnosis not present

## 2017-01-29 DIAGNOSIS — I509 Heart failure, unspecified: Secondary | ICD-10-CM | POA: Diagnosis not present

## 2017-01-29 DIAGNOSIS — E119 Type 2 diabetes mellitus without complications: Secondary | ICD-10-CM | POA: Diagnosis not present

## 2017-01-29 DIAGNOSIS — G2581 Restless legs syndrome: Secondary | ICD-10-CM | POA: Diagnosis not present

## 2017-01-29 DIAGNOSIS — J449 Chronic obstructive pulmonary disease, unspecified: Secondary | ICD-10-CM | POA: Diagnosis not present

## 2017-01-29 DIAGNOSIS — G4733 Obstructive sleep apnea (adult) (pediatric): Secondary | ICD-10-CM | POA: Diagnosis not present

## 2017-01-30 DIAGNOSIS — E119 Type 2 diabetes mellitus without complications: Secondary | ICD-10-CM | POA: Diagnosis not present

## 2017-01-30 DIAGNOSIS — L97228 Non-pressure chronic ulcer of left calf with other specified severity: Secondary | ICD-10-CM | POA: Diagnosis not present

## 2017-01-30 DIAGNOSIS — G2581 Restless legs syndrome: Secondary | ICD-10-CM | POA: Diagnosis not present

## 2017-01-30 DIAGNOSIS — I509 Heart failure, unspecified: Secondary | ICD-10-CM | POA: Diagnosis not present

## 2017-01-30 DIAGNOSIS — I214 Non-ST elevation (NSTEMI) myocardial infarction: Secondary | ICD-10-CM | POA: Diagnosis not present

## 2017-01-30 DIAGNOSIS — G4733 Obstructive sleep apnea (adult) (pediatric): Secondary | ICD-10-CM | POA: Diagnosis not present

## 2017-01-30 DIAGNOSIS — I872 Venous insufficiency (chronic) (peripheral): Secondary | ICD-10-CM | POA: Diagnosis not present

## 2017-01-30 DIAGNOSIS — J449 Chronic obstructive pulmonary disease, unspecified: Secondary | ICD-10-CM | POA: Diagnosis not present

## 2017-01-30 DIAGNOSIS — Z8572 Personal history of non-Hodgkin lymphomas: Secondary | ICD-10-CM | POA: Diagnosis not present

## 2017-01-30 DIAGNOSIS — R69 Illness, unspecified: Secondary | ICD-10-CM | POA: Diagnosis not present

## 2017-01-31 DIAGNOSIS — G2581 Restless legs syndrome: Secondary | ICD-10-CM | POA: Diagnosis not present

## 2017-01-31 DIAGNOSIS — R69 Illness, unspecified: Secondary | ICD-10-CM | POA: Diagnosis not present

## 2017-01-31 DIAGNOSIS — I872 Venous insufficiency (chronic) (peripheral): Secondary | ICD-10-CM | POA: Diagnosis not present

## 2017-01-31 DIAGNOSIS — Z8572 Personal history of non-Hodgkin lymphomas: Secondary | ICD-10-CM | POA: Diagnosis not present

## 2017-01-31 DIAGNOSIS — I509 Heart failure, unspecified: Secondary | ICD-10-CM | POA: Diagnosis not present

## 2017-01-31 DIAGNOSIS — J449 Chronic obstructive pulmonary disease, unspecified: Secondary | ICD-10-CM | POA: Diagnosis not present

## 2017-01-31 DIAGNOSIS — E119 Type 2 diabetes mellitus without complications: Secondary | ICD-10-CM | POA: Diagnosis not present

## 2017-01-31 DIAGNOSIS — G4733 Obstructive sleep apnea (adult) (pediatric): Secondary | ICD-10-CM | POA: Diagnosis not present

## 2017-01-31 DIAGNOSIS — L97228 Non-pressure chronic ulcer of left calf with other specified severity: Secondary | ICD-10-CM | POA: Diagnosis not present

## 2017-01-31 DIAGNOSIS — I214 Non-ST elevation (NSTEMI) myocardial infarction: Secondary | ICD-10-CM | POA: Diagnosis not present

## 2017-02-02 DIAGNOSIS — E1165 Type 2 diabetes mellitus with hyperglycemia: Secondary | ICD-10-CM | POA: Diagnosis not present

## 2017-02-02 DIAGNOSIS — I10 Essential (primary) hypertension: Secondary | ICD-10-CM | POA: Diagnosis not present

## 2017-02-02 DIAGNOSIS — Z6841 Body Mass Index (BMI) 40.0 and over, adult: Secondary | ICD-10-CM | POA: Diagnosis not present

## 2017-02-02 DIAGNOSIS — E782 Mixed hyperlipidemia: Secondary | ICD-10-CM | POA: Diagnosis not present

## 2017-02-02 DIAGNOSIS — J189 Pneumonia, unspecified organism: Secondary | ICD-10-CM | POA: Diagnosis not present

## 2017-02-02 DIAGNOSIS — E039 Hypothyroidism, unspecified: Secondary | ICD-10-CM | POA: Diagnosis not present

## 2017-02-02 DIAGNOSIS — N3000 Acute cystitis without hematuria: Secondary | ICD-10-CM | POA: Diagnosis not present

## 2017-02-04 DIAGNOSIS — Z8572 Personal history of non-Hodgkin lymphomas: Secondary | ICD-10-CM | POA: Diagnosis not present

## 2017-02-04 DIAGNOSIS — L97228 Non-pressure chronic ulcer of left calf with other specified severity: Secondary | ICD-10-CM | POA: Diagnosis not present

## 2017-02-04 DIAGNOSIS — R69 Illness, unspecified: Secondary | ICD-10-CM | POA: Diagnosis not present

## 2017-02-04 DIAGNOSIS — I509 Heart failure, unspecified: Secondary | ICD-10-CM | POA: Diagnosis not present

## 2017-02-04 DIAGNOSIS — G2581 Restless legs syndrome: Secondary | ICD-10-CM | POA: Diagnosis not present

## 2017-02-04 DIAGNOSIS — E119 Type 2 diabetes mellitus without complications: Secondary | ICD-10-CM | POA: Diagnosis not present

## 2017-02-04 DIAGNOSIS — I872 Venous insufficiency (chronic) (peripheral): Secondary | ICD-10-CM | POA: Diagnosis not present

## 2017-02-04 DIAGNOSIS — I214 Non-ST elevation (NSTEMI) myocardial infarction: Secondary | ICD-10-CM | POA: Diagnosis not present

## 2017-02-04 DIAGNOSIS — G4733 Obstructive sleep apnea (adult) (pediatric): Secondary | ICD-10-CM | POA: Diagnosis not present

## 2017-02-04 DIAGNOSIS — J449 Chronic obstructive pulmonary disease, unspecified: Secondary | ICD-10-CM | POA: Diagnosis not present

## 2017-02-05 DIAGNOSIS — J449 Chronic obstructive pulmonary disease, unspecified: Secondary | ICD-10-CM | POA: Diagnosis not present

## 2017-02-05 DIAGNOSIS — I214 Non-ST elevation (NSTEMI) myocardial infarction: Secondary | ICD-10-CM | POA: Diagnosis not present

## 2017-02-05 DIAGNOSIS — I509 Heart failure, unspecified: Secondary | ICD-10-CM | POA: Diagnosis not present

## 2017-02-05 DIAGNOSIS — E119 Type 2 diabetes mellitus without complications: Secondary | ICD-10-CM | POA: Diagnosis not present

## 2017-02-05 DIAGNOSIS — L97228 Non-pressure chronic ulcer of left calf with other specified severity: Secondary | ICD-10-CM | POA: Diagnosis not present

## 2017-02-05 DIAGNOSIS — G2581 Restless legs syndrome: Secondary | ICD-10-CM | POA: Diagnosis not present

## 2017-02-05 DIAGNOSIS — Z8572 Personal history of non-Hodgkin lymphomas: Secondary | ICD-10-CM | POA: Diagnosis not present

## 2017-02-05 DIAGNOSIS — I872 Venous insufficiency (chronic) (peripheral): Secondary | ICD-10-CM | POA: Diagnosis not present

## 2017-02-05 DIAGNOSIS — G4733 Obstructive sleep apnea (adult) (pediatric): Secondary | ICD-10-CM | POA: Diagnosis not present

## 2017-02-05 DIAGNOSIS — R69 Illness, unspecified: Secondary | ICD-10-CM | POA: Diagnosis not present

## 2017-02-06 DIAGNOSIS — D0512 Intraductal carcinoma in situ of left breast: Secondary | ICD-10-CM | POA: Diagnosis not present

## 2017-02-07 DIAGNOSIS — I214 Non-ST elevation (NSTEMI) myocardial infarction: Secondary | ICD-10-CM | POA: Diagnosis not present

## 2017-02-07 DIAGNOSIS — G2581 Restless legs syndrome: Secondary | ICD-10-CM | POA: Diagnosis not present

## 2017-02-07 DIAGNOSIS — R69 Illness, unspecified: Secondary | ICD-10-CM | POA: Diagnosis not present

## 2017-02-07 DIAGNOSIS — I872 Venous insufficiency (chronic) (peripheral): Secondary | ICD-10-CM | POA: Diagnosis not present

## 2017-02-07 DIAGNOSIS — J449 Chronic obstructive pulmonary disease, unspecified: Secondary | ICD-10-CM | POA: Diagnosis not present

## 2017-02-07 DIAGNOSIS — Z8572 Personal history of non-Hodgkin lymphomas: Secondary | ICD-10-CM | POA: Diagnosis not present

## 2017-02-07 DIAGNOSIS — I509 Heart failure, unspecified: Secondary | ICD-10-CM | POA: Diagnosis not present

## 2017-02-07 DIAGNOSIS — L97228 Non-pressure chronic ulcer of left calf with other specified severity: Secondary | ICD-10-CM | POA: Diagnosis not present

## 2017-02-07 DIAGNOSIS — E119 Type 2 diabetes mellitus without complications: Secondary | ICD-10-CM | POA: Diagnosis not present

## 2017-02-07 DIAGNOSIS — G4733 Obstructive sleep apnea (adult) (pediatric): Secondary | ICD-10-CM | POA: Diagnosis not present

## 2017-02-12 DIAGNOSIS — L97228 Non-pressure chronic ulcer of left calf with other specified severity: Secondary | ICD-10-CM | POA: Diagnosis not present

## 2017-02-12 DIAGNOSIS — J449 Chronic obstructive pulmonary disease, unspecified: Secondary | ICD-10-CM | POA: Diagnosis not present

## 2017-02-12 DIAGNOSIS — I509 Heart failure, unspecified: Secondary | ICD-10-CM | POA: Diagnosis not present

## 2017-02-12 DIAGNOSIS — G2581 Restless legs syndrome: Secondary | ICD-10-CM | POA: Diagnosis not present

## 2017-02-12 DIAGNOSIS — G4733 Obstructive sleep apnea (adult) (pediatric): Secondary | ICD-10-CM | POA: Diagnosis not present

## 2017-02-12 DIAGNOSIS — R69 Illness, unspecified: Secondary | ICD-10-CM | POA: Diagnosis not present

## 2017-02-12 DIAGNOSIS — E119 Type 2 diabetes mellitus without complications: Secondary | ICD-10-CM | POA: Diagnosis not present

## 2017-02-12 DIAGNOSIS — Z8572 Personal history of non-Hodgkin lymphomas: Secondary | ICD-10-CM | POA: Diagnosis not present

## 2017-02-12 DIAGNOSIS — I214 Non-ST elevation (NSTEMI) myocardial infarction: Secondary | ICD-10-CM | POA: Diagnosis not present

## 2017-02-12 DIAGNOSIS — I872 Venous insufficiency (chronic) (peripheral): Secondary | ICD-10-CM | POA: Diagnosis not present

## 2017-02-13 DIAGNOSIS — E119 Type 2 diabetes mellitus without complications: Secondary | ICD-10-CM | POA: Diagnosis not present

## 2017-02-13 DIAGNOSIS — R69 Illness, unspecified: Secondary | ICD-10-CM | POA: Diagnosis not present

## 2017-02-13 DIAGNOSIS — J449 Chronic obstructive pulmonary disease, unspecified: Secondary | ICD-10-CM | POA: Diagnosis not present

## 2017-02-13 DIAGNOSIS — I872 Venous insufficiency (chronic) (peripheral): Secondary | ICD-10-CM | POA: Diagnosis not present

## 2017-02-13 DIAGNOSIS — L97228 Non-pressure chronic ulcer of left calf with other specified severity: Secondary | ICD-10-CM | POA: Diagnosis not present

## 2017-02-13 DIAGNOSIS — G4733 Obstructive sleep apnea (adult) (pediatric): Secondary | ICD-10-CM | POA: Diagnosis not present

## 2017-02-13 DIAGNOSIS — Z8572 Personal history of non-Hodgkin lymphomas: Secondary | ICD-10-CM | POA: Diagnosis not present

## 2017-02-13 DIAGNOSIS — G2581 Restless legs syndrome: Secondary | ICD-10-CM | POA: Diagnosis not present

## 2017-02-13 DIAGNOSIS — I509 Heart failure, unspecified: Secondary | ICD-10-CM | POA: Diagnosis not present

## 2017-02-13 DIAGNOSIS — I214 Non-ST elevation (NSTEMI) myocardial infarction: Secondary | ICD-10-CM | POA: Diagnosis not present

## 2017-02-14 DIAGNOSIS — Z79899 Other long term (current) drug therapy: Secondary | ICD-10-CM | POA: Diagnosis not present

## 2017-02-14 DIAGNOSIS — R69 Illness, unspecified: Secondary | ICD-10-CM | POA: Diagnosis not present

## 2017-02-14 DIAGNOSIS — I214 Non-ST elevation (NSTEMI) myocardial infarction: Secondary | ICD-10-CM | POA: Diagnosis not present

## 2017-02-14 DIAGNOSIS — L97228 Non-pressure chronic ulcer of left calf with other specified severity: Secondary | ICD-10-CM | POA: Diagnosis not present

## 2017-02-14 DIAGNOSIS — H52223 Regular astigmatism, bilateral: Secondary | ICD-10-CM | POA: Diagnosis not present

## 2017-02-14 DIAGNOSIS — G2581 Restless legs syndrome: Secondary | ICD-10-CM | POA: Diagnosis not present

## 2017-02-14 DIAGNOSIS — E119 Type 2 diabetes mellitus without complications: Secondary | ICD-10-CM | POA: Diagnosis not present

## 2017-02-14 DIAGNOSIS — I509 Heart failure, unspecified: Secondary | ICD-10-CM | POA: Diagnosis not present

## 2017-02-14 DIAGNOSIS — J449 Chronic obstructive pulmonary disease, unspecified: Secondary | ICD-10-CM | POA: Diagnosis not present

## 2017-02-14 DIAGNOSIS — H524 Presbyopia: Secondary | ICD-10-CM | POA: Diagnosis not present

## 2017-02-14 DIAGNOSIS — H43811 Vitreous degeneration, right eye: Secondary | ICD-10-CM | POA: Diagnosis not present

## 2017-02-14 DIAGNOSIS — I872 Venous insufficiency (chronic) (peripheral): Secondary | ICD-10-CM | POA: Diagnosis not present

## 2017-02-14 DIAGNOSIS — G4733 Obstructive sleep apnea (adult) (pediatric): Secondary | ICD-10-CM | POA: Diagnosis not present

## 2017-02-14 DIAGNOSIS — H2513 Age-related nuclear cataract, bilateral: Secondary | ICD-10-CM | POA: Diagnosis not present

## 2017-02-14 DIAGNOSIS — H5203 Hypermetropia, bilateral: Secondary | ICD-10-CM | POA: Diagnosis not present

## 2017-02-14 DIAGNOSIS — Z8572 Personal history of non-Hodgkin lymphomas: Secondary | ICD-10-CM | POA: Diagnosis not present

## 2017-02-19 DIAGNOSIS — E1165 Type 2 diabetes mellitus with hyperglycemia: Secondary | ICD-10-CM | POA: Diagnosis not present

## 2017-02-19 DIAGNOSIS — Z8572 Personal history of non-Hodgkin lymphomas: Secondary | ICD-10-CM | POA: Diagnosis not present

## 2017-02-19 DIAGNOSIS — E119 Type 2 diabetes mellitus without complications: Secondary | ICD-10-CM | POA: Diagnosis not present

## 2017-02-19 DIAGNOSIS — R600 Localized edema: Secondary | ICD-10-CM | POA: Diagnosis not present

## 2017-02-19 DIAGNOSIS — I872 Venous insufficiency (chronic) (peripheral): Secondary | ICD-10-CM | POA: Diagnosis not present

## 2017-02-19 DIAGNOSIS — G2581 Restless legs syndrome: Secondary | ICD-10-CM | POA: Diagnosis not present

## 2017-02-19 DIAGNOSIS — J449 Chronic obstructive pulmonary disease, unspecified: Secondary | ICD-10-CM | POA: Diagnosis not present

## 2017-02-19 DIAGNOSIS — I509 Heart failure, unspecified: Secondary | ICD-10-CM | POA: Diagnosis not present

## 2017-02-19 DIAGNOSIS — L97228 Non-pressure chronic ulcer of left calf with other specified severity: Secondary | ICD-10-CM | POA: Diagnosis not present

## 2017-02-19 DIAGNOSIS — Z6841 Body Mass Index (BMI) 40.0 and over, adult: Secondary | ICD-10-CM | POA: Diagnosis not present

## 2017-02-19 DIAGNOSIS — E039 Hypothyroidism, unspecified: Secondary | ICD-10-CM | POA: Diagnosis not present

## 2017-02-19 DIAGNOSIS — R69 Illness, unspecified: Secondary | ICD-10-CM | POA: Diagnosis not present

## 2017-02-19 DIAGNOSIS — G4733 Obstructive sleep apnea (adult) (pediatric): Secondary | ICD-10-CM | POA: Diagnosis not present

## 2017-02-19 DIAGNOSIS — R252 Cramp and spasm: Secondary | ICD-10-CM | POA: Diagnosis not present

## 2017-02-19 DIAGNOSIS — I10 Essential (primary) hypertension: Secondary | ICD-10-CM | POA: Diagnosis not present

## 2017-02-19 DIAGNOSIS — I214 Non-ST elevation (NSTEMI) myocardial infarction: Secondary | ICD-10-CM | POA: Diagnosis not present

## 2017-02-20 DIAGNOSIS — Z8572 Personal history of non-Hodgkin lymphomas: Secondary | ICD-10-CM | POA: Diagnosis not present

## 2017-02-20 DIAGNOSIS — J449 Chronic obstructive pulmonary disease, unspecified: Secondary | ICD-10-CM | POA: Diagnosis not present

## 2017-02-20 DIAGNOSIS — G4733 Obstructive sleep apnea (adult) (pediatric): Secondary | ICD-10-CM | POA: Diagnosis not present

## 2017-02-20 DIAGNOSIS — G2581 Restless legs syndrome: Secondary | ICD-10-CM | POA: Diagnosis not present

## 2017-02-20 DIAGNOSIS — E119 Type 2 diabetes mellitus without complications: Secondary | ICD-10-CM | POA: Diagnosis not present

## 2017-02-20 DIAGNOSIS — I509 Heart failure, unspecified: Secondary | ICD-10-CM | POA: Diagnosis not present

## 2017-02-20 DIAGNOSIS — L97228 Non-pressure chronic ulcer of left calf with other specified severity: Secondary | ICD-10-CM | POA: Diagnosis not present

## 2017-02-20 DIAGNOSIS — I214 Non-ST elevation (NSTEMI) myocardial infarction: Secondary | ICD-10-CM | POA: Diagnosis not present

## 2017-02-20 DIAGNOSIS — I872 Venous insufficiency (chronic) (peripheral): Secondary | ICD-10-CM | POA: Diagnosis not present

## 2017-02-20 DIAGNOSIS — R69 Illness, unspecified: Secondary | ICD-10-CM | POA: Diagnosis not present

## 2017-02-21 DIAGNOSIS — G2581 Restless legs syndrome: Secondary | ICD-10-CM | POA: Diagnosis not present

## 2017-02-21 DIAGNOSIS — I10 Essential (primary) hypertension: Secondary | ICD-10-CM | POA: Diagnosis not present

## 2017-02-21 DIAGNOSIS — G4733 Obstructive sleep apnea (adult) (pediatric): Secondary | ICD-10-CM | POA: Diagnosis not present

## 2017-02-21 DIAGNOSIS — E119 Type 2 diabetes mellitus without complications: Secondary | ICD-10-CM | POA: Diagnosis not present

## 2017-02-21 DIAGNOSIS — N183 Chronic kidney disease, stage 3 (moderate): Secondary | ICD-10-CM | POA: Diagnosis not present

## 2017-02-21 DIAGNOSIS — R0902 Hypoxemia: Secondary | ICD-10-CM | POA: Diagnosis not present

## 2017-02-21 DIAGNOSIS — J449 Chronic obstructive pulmonary disease, unspecified: Secondary | ICD-10-CM | POA: Diagnosis not present

## 2017-02-21 DIAGNOSIS — L97228 Non-pressure chronic ulcer of left calf with other specified severity: Secondary | ICD-10-CM | POA: Diagnosis not present

## 2017-02-21 DIAGNOSIS — R69 Illness, unspecified: Secondary | ICD-10-CM | POA: Diagnosis not present

## 2017-02-21 DIAGNOSIS — I509 Heart failure, unspecified: Secondary | ICD-10-CM | POA: Diagnosis not present

## 2017-02-21 DIAGNOSIS — Z8572 Personal history of non-Hodgkin lymphomas: Secondary | ICD-10-CM | POA: Diagnosis not present

## 2017-02-21 DIAGNOSIS — I872 Venous insufficiency (chronic) (peripheral): Secondary | ICD-10-CM | POA: Diagnosis not present

## 2017-02-21 DIAGNOSIS — I878 Other specified disorders of veins: Secondary | ICD-10-CM | POA: Diagnosis not present

## 2017-02-21 DIAGNOSIS — I214 Non-ST elevation (NSTEMI) myocardial infarction: Secondary | ICD-10-CM | POA: Diagnosis not present

## 2017-02-27 DIAGNOSIS — Z96651 Presence of right artificial knee joint: Secondary | ICD-10-CM | POA: Diagnosis not present

## 2017-02-27 DIAGNOSIS — M25562 Pain in left knee: Secondary | ICD-10-CM | POA: Diagnosis not present

## 2017-02-27 DIAGNOSIS — M1712 Unilateral primary osteoarthritis, left knee: Secondary | ICD-10-CM | POA: Diagnosis not present

## 2017-02-28 DIAGNOSIS — E119 Type 2 diabetes mellitus without complications: Secondary | ICD-10-CM | POA: Diagnosis not present

## 2017-02-28 DIAGNOSIS — G4733 Obstructive sleep apnea (adult) (pediatric): Secondary | ICD-10-CM | POA: Diagnosis not present

## 2017-02-28 DIAGNOSIS — L97228 Non-pressure chronic ulcer of left calf with other specified severity: Secondary | ICD-10-CM | POA: Diagnosis not present

## 2017-02-28 DIAGNOSIS — I872 Venous insufficiency (chronic) (peripheral): Secondary | ICD-10-CM | POA: Diagnosis not present

## 2017-02-28 DIAGNOSIS — R69 Illness, unspecified: Secondary | ICD-10-CM | POA: Diagnosis not present

## 2017-02-28 DIAGNOSIS — G2581 Restless legs syndrome: Secondary | ICD-10-CM | POA: Diagnosis not present

## 2017-02-28 DIAGNOSIS — I509 Heart failure, unspecified: Secondary | ICD-10-CM | POA: Diagnosis not present

## 2017-02-28 DIAGNOSIS — I214 Non-ST elevation (NSTEMI) myocardial infarction: Secondary | ICD-10-CM | POA: Diagnosis not present

## 2017-02-28 DIAGNOSIS — J449 Chronic obstructive pulmonary disease, unspecified: Secondary | ICD-10-CM | POA: Diagnosis not present

## 2017-02-28 DIAGNOSIS — Z8572 Personal history of non-Hodgkin lymphomas: Secondary | ICD-10-CM | POA: Diagnosis not present

## 2017-03-05 DIAGNOSIS — L97228 Non-pressure chronic ulcer of left calf with other specified severity: Secondary | ICD-10-CM | POA: Diagnosis not present

## 2017-03-05 DIAGNOSIS — E119 Type 2 diabetes mellitus without complications: Secondary | ICD-10-CM | POA: Diagnosis not present

## 2017-03-05 DIAGNOSIS — I872 Venous insufficiency (chronic) (peripheral): Secondary | ICD-10-CM | POA: Diagnosis not present

## 2017-03-05 DIAGNOSIS — I509 Heart failure, unspecified: Secondary | ICD-10-CM | POA: Diagnosis not present

## 2017-03-05 DIAGNOSIS — J449 Chronic obstructive pulmonary disease, unspecified: Secondary | ICD-10-CM | POA: Diagnosis not present

## 2017-03-05 DIAGNOSIS — G2581 Restless legs syndrome: Secondary | ICD-10-CM | POA: Diagnosis not present

## 2017-03-05 DIAGNOSIS — G4733 Obstructive sleep apnea (adult) (pediatric): Secondary | ICD-10-CM | POA: Diagnosis not present

## 2017-03-05 DIAGNOSIS — I214 Non-ST elevation (NSTEMI) myocardial infarction: Secondary | ICD-10-CM | POA: Diagnosis not present

## 2017-03-05 DIAGNOSIS — R69 Illness, unspecified: Secondary | ICD-10-CM | POA: Diagnosis not present

## 2017-03-05 DIAGNOSIS — Z8572 Personal history of non-Hodgkin lymphomas: Secondary | ICD-10-CM | POA: Diagnosis not present

## 2017-03-06 DIAGNOSIS — I509 Heart failure, unspecified: Secondary | ICD-10-CM | POA: Diagnosis not present

## 2017-03-06 DIAGNOSIS — G2581 Restless legs syndrome: Secondary | ICD-10-CM | POA: Diagnosis not present

## 2017-03-06 DIAGNOSIS — G4733 Obstructive sleep apnea (adult) (pediatric): Secondary | ICD-10-CM | POA: Diagnosis not present

## 2017-03-06 DIAGNOSIS — E119 Type 2 diabetes mellitus without complications: Secondary | ICD-10-CM | POA: Diagnosis not present

## 2017-03-06 DIAGNOSIS — R69 Illness, unspecified: Secondary | ICD-10-CM | POA: Diagnosis not present

## 2017-03-06 DIAGNOSIS — I214 Non-ST elevation (NSTEMI) myocardial infarction: Secondary | ICD-10-CM | POA: Diagnosis not present

## 2017-03-06 DIAGNOSIS — I872 Venous insufficiency (chronic) (peripheral): Secondary | ICD-10-CM | POA: Diagnosis not present

## 2017-03-06 DIAGNOSIS — J449 Chronic obstructive pulmonary disease, unspecified: Secondary | ICD-10-CM | POA: Diagnosis not present

## 2017-03-06 DIAGNOSIS — L97228 Non-pressure chronic ulcer of left calf with other specified severity: Secondary | ICD-10-CM | POA: Diagnosis not present

## 2017-03-06 DIAGNOSIS — Z8572 Personal history of non-Hodgkin lymphomas: Secondary | ICD-10-CM | POA: Diagnosis not present

## 2017-03-07 DIAGNOSIS — I878 Other specified disorders of veins: Secondary | ICD-10-CM | POA: Diagnosis not present

## 2017-03-07 DIAGNOSIS — I872 Venous insufficiency (chronic) (peripheral): Secondary | ICD-10-CM | POA: Diagnosis not present

## 2017-03-14 DIAGNOSIS — I872 Venous insufficiency (chronic) (peripheral): Secondary | ICD-10-CM | POA: Diagnosis not present

## 2017-03-14 DIAGNOSIS — L97228 Non-pressure chronic ulcer of left calf with other specified severity: Secondary | ICD-10-CM | POA: Diagnosis not present

## 2017-03-14 DIAGNOSIS — L97222 Non-pressure chronic ulcer of left calf with fat layer exposed: Secondary | ICD-10-CM | POA: Diagnosis not present

## 2017-03-14 DIAGNOSIS — Z945 Skin transplant status: Secondary | ICD-10-CM | POA: Diagnosis not present

## 2017-03-14 DIAGNOSIS — I878 Other specified disorders of veins: Secondary | ICD-10-CM | POA: Diagnosis not present

## 2017-03-19 DIAGNOSIS — Z8572 Personal history of non-Hodgkin lymphomas: Secondary | ICD-10-CM | POA: Diagnosis not present

## 2017-03-19 DIAGNOSIS — I872 Venous insufficiency (chronic) (peripheral): Secondary | ICD-10-CM | POA: Diagnosis not present

## 2017-03-19 DIAGNOSIS — E119 Type 2 diabetes mellitus without complications: Secondary | ICD-10-CM | POA: Diagnosis not present

## 2017-03-19 DIAGNOSIS — I214 Non-ST elevation (NSTEMI) myocardial infarction: Secondary | ICD-10-CM | POA: Diagnosis not present

## 2017-03-19 DIAGNOSIS — G2581 Restless legs syndrome: Secondary | ICD-10-CM | POA: Diagnosis not present

## 2017-03-19 DIAGNOSIS — G4733 Obstructive sleep apnea (adult) (pediatric): Secondary | ICD-10-CM | POA: Diagnosis not present

## 2017-03-19 DIAGNOSIS — J449 Chronic obstructive pulmonary disease, unspecified: Secondary | ICD-10-CM | POA: Diagnosis not present

## 2017-03-19 DIAGNOSIS — L97228 Non-pressure chronic ulcer of left calf with other specified severity: Secondary | ICD-10-CM | POA: Diagnosis not present

## 2017-03-19 DIAGNOSIS — R69 Illness, unspecified: Secondary | ICD-10-CM | POA: Diagnosis not present

## 2017-03-19 DIAGNOSIS — I509 Heart failure, unspecified: Secondary | ICD-10-CM | POA: Diagnosis not present

## 2017-03-21 DIAGNOSIS — G2581 Restless legs syndrome: Secondary | ICD-10-CM | POA: Diagnosis not present

## 2017-03-21 DIAGNOSIS — R69 Illness, unspecified: Secondary | ICD-10-CM | POA: Diagnosis not present

## 2017-03-21 DIAGNOSIS — E119 Type 2 diabetes mellitus without complications: Secondary | ICD-10-CM | POA: Diagnosis not present

## 2017-03-21 DIAGNOSIS — L97221 Non-pressure chronic ulcer of left calf limited to breakdown of skin: Secondary | ICD-10-CM | POA: Diagnosis not present

## 2017-03-21 DIAGNOSIS — Z79891 Long term (current) use of opiate analgesic: Secondary | ICD-10-CM | POA: Diagnosis not present

## 2017-03-21 DIAGNOSIS — Z8572 Personal history of non-Hodgkin lymphomas: Secondary | ICD-10-CM | POA: Diagnosis not present

## 2017-03-21 DIAGNOSIS — I872 Venous insufficiency (chronic) (peripheral): Secondary | ICD-10-CM | POA: Diagnosis not present

## 2017-03-21 DIAGNOSIS — G4733 Obstructive sleep apnea (adult) (pediatric): Secondary | ICD-10-CM | POA: Diagnosis not present

## 2017-03-21 DIAGNOSIS — J449 Chronic obstructive pulmonary disease, unspecified: Secondary | ICD-10-CM | POA: Diagnosis not present

## 2017-03-21 DIAGNOSIS — Z794 Long term (current) use of insulin: Secondary | ICD-10-CM | POA: Diagnosis not present

## 2017-03-27 DIAGNOSIS — G2581 Restless legs syndrome: Secondary | ICD-10-CM | POA: Diagnosis not present

## 2017-03-27 DIAGNOSIS — L97221 Non-pressure chronic ulcer of left calf limited to breakdown of skin: Secondary | ICD-10-CM | POA: Diagnosis not present

## 2017-03-27 DIAGNOSIS — G4733 Obstructive sleep apnea (adult) (pediatric): Secondary | ICD-10-CM | POA: Diagnosis not present

## 2017-03-27 DIAGNOSIS — R69 Illness, unspecified: Secondary | ICD-10-CM | POA: Diagnosis not present

## 2017-03-27 DIAGNOSIS — J449 Chronic obstructive pulmonary disease, unspecified: Secondary | ICD-10-CM | POA: Diagnosis not present

## 2017-03-27 DIAGNOSIS — I872 Venous insufficiency (chronic) (peripheral): Secondary | ICD-10-CM | POA: Diagnosis not present

## 2017-03-27 DIAGNOSIS — Z8572 Personal history of non-Hodgkin lymphomas: Secondary | ICD-10-CM | POA: Diagnosis not present

## 2017-03-27 DIAGNOSIS — Z79891 Long term (current) use of opiate analgesic: Secondary | ICD-10-CM | POA: Diagnosis not present

## 2017-03-27 DIAGNOSIS — E119 Type 2 diabetes mellitus without complications: Secondary | ICD-10-CM | POA: Diagnosis not present

## 2017-03-27 DIAGNOSIS — Z794 Long term (current) use of insulin: Secondary | ICD-10-CM | POA: Diagnosis not present

## 2017-03-28 DIAGNOSIS — L97222 Non-pressure chronic ulcer of left calf with fat layer exposed: Secondary | ICD-10-CM | POA: Diagnosis not present

## 2017-03-28 DIAGNOSIS — I872 Venous insufficiency (chronic) (peripheral): Secondary | ICD-10-CM | POA: Diagnosis not present

## 2017-03-28 DIAGNOSIS — L97228 Non-pressure chronic ulcer of left calf with other specified severity: Secondary | ICD-10-CM | POA: Diagnosis not present

## 2017-04-10 DIAGNOSIS — L97221 Non-pressure chronic ulcer of left calf limited to breakdown of skin: Secondary | ICD-10-CM | POA: Diagnosis not present

## 2017-04-10 DIAGNOSIS — G4733 Obstructive sleep apnea (adult) (pediatric): Secondary | ICD-10-CM | POA: Diagnosis not present

## 2017-04-10 DIAGNOSIS — J449 Chronic obstructive pulmonary disease, unspecified: Secondary | ICD-10-CM | POA: Diagnosis not present

## 2017-04-10 DIAGNOSIS — R69 Illness, unspecified: Secondary | ICD-10-CM | POA: Diagnosis not present

## 2017-04-10 DIAGNOSIS — I872 Venous insufficiency (chronic) (peripheral): Secondary | ICD-10-CM | POA: Diagnosis not present

## 2017-04-10 DIAGNOSIS — Z8572 Personal history of non-Hodgkin lymphomas: Secondary | ICD-10-CM | POA: Diagnosis not present

## 2017-04-10 DIAGNOSIS — Z794 Long term (current) use of insulin: Secondary | ICD-10-CM | POA: Diagnosis not present

## 2017-04-10 DIAGNOSIS — G2581 Restless legs syndrome: Secondary | ICD-10-CM | POA: Diagnosis not present

## 2017-04-10 DIAGNOSIS — Z79891 Long term (current) use of opiate analgesic: Secondary | ICD-10-CM | POA: Diagnosis not present

## 2017-04-10 DIAGNOSIS — E119 Type 2 diabetes mellitus without complications: Secondary | ICD-10-CM | POA: Diagnosis not present

## 2017-04-11 DIAGNOSIS — L97222 Non-pressure chronic ulcer of left calf with fat layer exposed: Secondary | ICD-10-CM | POA: Diagnosis not present

## 2017-04-11 DIAGNOSIS — I872 Venous insufficiency (chronic) (peripheral): Secondary | ICD-10-CM | POA: Diagnosis not present

## 2017-04-11 DIAGNOSIS — L97228 Non-pressure chronic ulcer of left calf with other specified severity: Secondary | ICD-10-CM | POA: Diagnosis not present

## 2017-04-16 DIAGNOSIS — Z794 Long term (current) use of insulin: Secondary | ICD-10-CM | POA: Diagnosis not present

## 2017-04-16 DIAGNOSIS — G4733 Obstructive sleep apnea (adult) (pediatric): Secondary | ICD-10-CM | POA: Diagnosis not present

## 2017-04-16 DIAGNOSIS — Z79891 Long term (current) use of opiate analgesic: Secondary | ICD-10-CM | POA: Diagnosis not present

## 2017-04-16 DIAGNOSIS — Z8572 Personal history of non-Hodgkin lymphomas: Secondary | ICD-10-CM | POA: Diagnosis not present

## 2017-04-16 DIAGNOSIS — L97221 Non-pressure chronic ulcer of left calf limited to breakdown of skin: Secondary | ICD-10-CM | POA: Diagnosis not present

## 2017-04-16 DIAGNOSIS — G2581 Restless legs syndrome: Secondary | ICD-10-CM | POA: Diagnosis not present

## 2017-04-16 DIAGNOSIS — I872 Venous insufficiency (chronic) (peripheral): Secondary | ICD-10-CM | POA: Diagnosis not present

## 2017-04-16 DIAGNOSIS — R69 Illness, unspecified: Secondary | ICD-10-CM | POA: Diagnosis not present

## 2017-04-16 DIAGNOSIS — J449 Chronic obstructive pulmonary disease, unspecified: Secondary | ICD-10-CM | POA: Diagnosis not present

## 2017-04-16 DIAGNOSIS — E119 Type 2 diabetes mellitus without complications: Secondary | ICD-10-CM | POA: Diagnosis not present

## 2017-04-17 DIAGNOSIS — I872 Venous insufficiency (chronic) (peripheral): Secondary | ICD-10-CM | POA: Diagnosis not present

## 2017-04-17 DIAGNOSIS — L97222 Non-pressure chronic ulcer of left calf with fat layer exposed: Secondary | ICD-10-CM | POA: Diagnosis not present

## 2017-04-17 DIAGNOSIS — L97228 Non-pressure chronic ulcer of left calf with other specified severity: Secondary | ICD-10-CM | POA: Diagnosis not present

## 2017-04-19 DIAGNOSIS — J449 Chronic obstructive pulmonary disease, unspecified: Secondary | ICD-10-CM | POA: Diagnosis not present

## 2017-04-19 DIAGNOSIS — G2581 Restless legs syndrome: Secondary | ICD-10-CM | POA: Diagnosis not present

## 2017-04-19 DIAGNOSIS — I872 Venous insufficiency (chronic) (peripheral): Secondary | ICD-10-CM | POA: Diagnosis not present

## 2017-04-19 DIAGNOSIS — Z8572 Personal history of non-Hodgkin lymphomas: Secondary | ICD-10-CM | POA: Diagnosis not present

## 2017-04-19 DIAGNOSIS — G4733 Obstructive sleep apnea (adult) (pediatric): Secondary | ICD-10-CM | POA: Diagnosis not present

## 2017-04-19 DIAGNOSIS — L97221 Non-pressure chronic ulcer of left calf limited to breakdown of skin: Secondary | ICD-10-CM | POA: Diagnosis not present

## 2017-04-19 DIAGNOSIS — E119 Type 2 diabetes mellitus without complications: Secondary | ICD-10-CM | POA: Diagnosis not present

## 2017-04-19 DIAGNOSIS — Z794 Long term (current) use of insulin: Secondary | ICD-10-CM | POA: Diagnosis not present

## 2017-04-19 DIAGNOSIS — R69 Illness, unspecified: Secondary | ICD-10-CM | POA: Diagnosis not present

## 2017-04-19 DIAGNOSIS — Z79891 Long term (current) use of opiate analgesic: Secondary | ICD-10-CM | POA: Diagnosis not present

## 2017-04-23 DIAGNOSIS — Z8572 Personal history of non-Hodgkin lymphomas: Secondary | ICD-10-CM | POA: Diagnosis not present

## 2017-04-23 DIAGNOSIS — Z79891 Long term (current) use of opiate analgesic: Secondary | ICD-10-CM | POA: Diagnosis not present

## 2017-04-23 DIAGNOSIS — Z794 Long term (current) use of insulin: Secondary | ICD-10-CM | POA: Diagnosis not present

## 2017-04-23 DIAGNOSIS — J449 Chronic obstructive pulmonary disease, unspecified: Secondary | ICD-10-CM | POA: Diagnosis not present

## 2017-04-23 DIAGNOSIS — G2581 Restless legs syndrome: Secondary | ICD-10-CM | POA: Diagnosis not present

## 2017-04-23 DIAGNOSIS — R69 Illness, unspecified: Secondary | ICD-10-CM | POA: Diagnosis not present

## 2017-04-23 DIAGNOSIS — G4733 Obstructive sleep apnea (adult) (pediatric): Secondary | ICD-10-CM | POA: Diagnosis not present

## 2017-04-23 DIAGNOSIS — I872 Venous insufficiency (chronic) (peripheral): Secondary | ICD-10-CM | POA: Diagnosis not present

## 2017-04-23 DIAGNOSIS — E119 Type 2 diabetes mellitus without complications: Secondary | ICD-10-CM | POA: Diagnosis not present

## 2017-04-23 DIAGNOSIS — L97221 Non-pressure chronic ulcer of left calf limited to breakdown of skin: Secondary | ICD-10-CM | POA: Diagnosis not present

## 2017-04-25 DIAGNOSIS — G4733 Obstructive sleep apnea (adult) (pediatric): Secondary | ICD-10-CM | POA: Diagnosis not present

## 2017-04-25 DIAGNOSIS — I872 Venous insufficiency (chronic) (peripheral): Secondary | ICD-10-CM | POA: Diagnosis not present

## 2017-04-25 DIAGNOSIS — Z8572 Personal history of non-Hodgkin lymphomas: Secondary | ICD-10-CM | POA: Diagnosis not present

## 2017-04-25 DIAGNOSIS — R69 Illness, unspecified: Secondary | ICD-10-CM | POA: Diagnosis not present

## 2017-04-25 DIAGNOSIS — J449 Chronic obstructive pulmonary disease, unspecified: Secondary | ICD-10-CM | POA: Diagnosis not present

## 2017-04-25 DIAGNOSIS — L97221 Non-pressure chronic ulcer of left calf limited to breakdown of skin: Secondary | ICD-10-CM | POA: Diagnosis not present

## 2017-04-25 DIAGNOSIS — Z79891 Long term (current) use of opiate analgesic: Secondary | ICD-10-CM | POA: Diagnosis not present

## 2017-04-25 DIAGNOSIS — G2581 Restless legs syndrome: Secondary | ICD-10-CM | POA: Diagnosis not present

## 2017-04-25 DIAGNOSIS — E119 Type 2 diabetes mellitus without complications: Secondary | ICD-10-CM | POA: Diagnosis not present

## 2017-04-25 DIAGNOSIS — Z794 Long term (current) use of insulin: Secondary | ICD-10-CM | POA: Diagnosis not present

## 2017-04-27 DIAGNOSIS — E119 Type 2 diabetes mellitus without complications: Secondary | ICD-10-CM | POA: Diagnosis not present

## 2017-04-27 DIAGNOSIS — G4733 Obstructive sleep apnea (adult) (pediatric): Secondary | ICD-10-CM | POA: Diagnosis not present

## 2017-04-27 DIAGNOSIS — Z794 Long term (current) use of insulin: Secondary | ICD-10-CM | POA: Diagnosis not present

## 2017-04-27 DIAGNOSIS — L97221 Non-pressure chronic ulcer of left calf limited to breakdown of skin: Secondary | ICD-10-CM | POA: Diagnosis not present

## 2017-04-27 DIAGNOSIS — Z79891 Long term (current) use of opiate analgesic: Secondary | ICD-10-CM | POA: Diagnosis not present

## 2017-04-27 DIAGNOSIS — I872 Venous insufficiency (chronic) (peripheral): Secondary | ICD-10-CM | POA: Diagnosis not present

## 2017-04-27 DIAGNOSIS — Z8572 Personal history of non-Hodgkin lymphomas: Secondary | ICD-10-CM | POA: Diagnosis not present

## 2017-04-27 DIAGNOSIS — G2581 Restless legs syndrome: Secondary | ICD-10-CM | POA: Diagnosis not present

## 2017-04-27 DIAGNOSIS — J449 Chronic obstructive pulmonary disease, unspecified: Secondary | ICD-10-CM | POA: Diagnosis not present

## 2017-04-27 DIAGNOSIS — R69 Illness, unspecified: Secondary | ICD-10-CM | POA: Diagnosis not present

## 2017-04-29 DIAGNOSIS — M47812 Spondylosis without myelopathy or radiculopathy, cervical region: Secondary | ICD-10-CM | POA: Diagnosis not present

## 2017-04-29 DIAGNOSIS — M50222 Other cervical disc displacement at C5-C6 level: Secondary | ICD-10-CM | POA: Diagnosis not present

## 2017-04-29 DIAGNOSIS — M47814 Spondylosis without myelopathy or radiculopathy, thoracic region: Secondary | ICD-10-CM | POA: Diagnosis not present

## 2017-04-29 DIAGNOSIS — M5124 Other intervertebral disc displacement, thoracic region: Secondary | ICD-10-CM | POA: Diagnosis not present

## 2017-04-29 DIAGNOSIS — M5134 Other intervertebral disc degeneration, thoracic region: Secondary | ICD-10-CM | POA: Diagnosis not present

## 2017-04-29 DIAGNOSIS — M47892 Other spondylosis, cervical region: Secondary | ICD-10-CM | POA: Diagnosis not present

## 2017-04-30 DIAGNOSIS — R69 Illness, unspecified: Secondary | ICD-10-CM | POA: Diagnosis not present

## 2017-04-30 DIAGNOSIS — I872 Venous insufficiency (chronic) (peripheral): Secondary | ICD-10-CM | POA: Diagnosis not present

## 2017-04-30 DIAGNOSIS — Z794 Long term (current) use of insulin: Secondary | ICD-10-CM | POA: Diagnosis not present

## 2017-04-30 DIAGNOSIS — J449 Chronic obstructive pulmonary disease, unspecified: Secondary | ICD-10-CM | POA: Diagnosis not present

## 2017-04-30 DIAGNOSIS — L97221 Non-pressure chronic ulcer of left calf limited to breakdown of skin: Secondary | ICD-10-CM | POA: Diagnosis not present

## 2017-04-30 DIAGNOSIS — Z8572 Personal history of non-Hodgkin lymphomas: Secondary | ICD-10-CM | POA: Diagnosis not present

## 2017-04-30 DIAGNOSIS — Z79891 Long term (current) use of opiate analgesic: Secondary | ICD-10-CM | POA: Diagnosis not present

## 2017-04-30 DIAGNOSIS — G2581 Restless legs syndrome: Secondary | ICD-10-CM | POA: Diagnosis not present

## 2017-04-30 DIAGNOSIS — E119 Type 2 diabetes mellitus without complications: Secondary | ICD-10-CM | POA: Diagnosis not present

## 2017-04-30 DIAGNOSIS — G4733 Obstructive sleep apnea (adult) (pediatric): Secondary | ICD-10-CM | POA: Diagnosis not present

## 2017-05-02 DIAGNOSIS — Z8572 Personal history of non-Hodgkin lymphomas: Secondary | ICD-10-CM | POA: Diagnosis not present

## 2017-05-02 DIAGNOSIS — I872 Venous insufficiency (chronic) (peripheral): Secondary | ICD-10-CM | POA: Diagnosis not present

## 2017-05-02 DIAGNOSIS — Z79891 Long term (current) use of opiate analgesic: Secondary | ICD-10-CM | POA: Diagnosis not present

## 2017-05-02 DIAGNOSIS — R69 Illness, unspecified: Secondary | ICD-10-CM | POA: Diagnosis not present

## 2017-05-02 DIAGNOSIS — E119 Type 2 diabetes mellitus without complications: Secondary | ICD-10-CM | POA: Diagnosis not present

## 2017-05-02 DIAGNOSIS — L97221 Non-pressure chronic ulcer of left calf limited to breakdown of skin: Secondary | ICD-10-CM | POA: Diagnosis not present

## 2017-05-02 DIAGNOSIS — Z794 Long term (current) use of insulin: Secondary | ICD-10-CM | POA: Diagnosis not present

## 2017-05-02 DIAGNOSIS — G4733 Obstructive sleep apnea (adult) (pediatric): Secondary | ICD-10-CM | POA: Diagnosis not present

## 2017-05-02 DIAGNOSIS — G2581 Restless legs syndrome: Secondary | ICD-10-CM | POA: Diagnosis not present

## 2017-05-02 DIAGNOSIS — J449 Chronic obstructive pulmonary disease, unspecified: Secondary | ICD-10-CM | POA: Diagnosis not present

## 2017-05-03 DIAGNOSIS — G2581 Restless legs syndrome: Secondary | ICD-10-CM | POA: Diagnosis not present

## 2017-05-03 DIAGNOSIS — E119 Type 2 diabetes mellitus without complications: Secondary | ICD-10-CM | POA: Diagnosis not present

## 2017-05-03 DIAGNOSIS — G4733 Obstructive sleep apnea (adult) (pediatric): Secondary | ICD-10-CM | POA: Diagnosis not present

## 2017-05-03 DIAGNOSIS — Z8572 Personal history of non-Hodgkin lymphomas: Secondary | ICD-10-CM | POA: Diagnosis not present

## 2017-05-03 DIAGNOSIS — R69 Illness, unspecified: Secondary | ICD-10-CM | POA: Diagnosis not present

## 2017-05-03 DIAGNOSIS — I872 Venous insufficiency (chronic) (peripheral): Secondary | ICD-10-CM | POA: Diagnosis not present

## 2017-05-03 DIAGNOSIS — L97221 Non-pressure chronic ulcer of left calf limited to breakdown of skin: Secondary | ICD-10-CM | POA: Diagnosis not present

## 2017-05-03 DIAGNOSIS — Z79891 Long term (current) use of opiate analgesic: Secondary | ICD-10-CM | POA: Diagnosis not present

## 2017-05-03 DIAGNOSIS — Z794 Long term (current) use of insulin: Secondary | ICD-10-CM | POA: Diagnosis not present

## 2017-05-03 DIAGNOSIS — J449 Chronic obstructive pulmonary disease, unspecified: Secondary | ICD-10-CM | POA: Diagnosis not present

## 2017-05-20 DIAGNOSIS — Z853 Personal history of malignant neoplasm of breast: Secondary | ICD-10-CM | POA: Diagnosis not present

## 2017-05-20 DIAGNOSIS — G2581 Restless legs syndrome: Secondary | ICD-10-CM | POA: Diagnosis not present

## 2017-05-20 DIAGNOSIS — R69 Illness, unspecified: Secondary | ICD-10-CM | POA: Diagnosis not present

## 2017-05-20 DIAGNOSIS — I872 Venous insufficiency (chronic) (peripheral): Secondary | ICD-10-CM | POA: Diagnosis not present

## 2017-05-20 DIAGNOSIS — J449 Chronic obstructive pulmonary disease, unspecified: Secondary | ICD-10-CM | POA: Diagnosis not present

## 2017-05-20 DIAGNOSIS — I509 Heart failure, unspecified: Secondary | ICD-10-CM | POA: Diagnosis not present

## 2017-05-20 DIAGNOSIS — Z8572 Personal history of non-Hodgkin lymphomas: Secondary | ICD-10-CM | POA: Diagnosis not present

## 2017-05-20 DIAGNOSIS — L97222 Non-pressure chronic ulcer of left calf with fat layer exposed: Secondary | ICD-10-CM | POA: Diagnosis not present

## 2017-05-20 DIAGNOSIS — E119 Type 2 diabetes mellitus without complications: Secondary | ICD-10-CM | POA: Diagnosis not present

## 2017-05-20 DIAGNOSIS — G4733 Obstructive sleep apnea (adult) (pediatric): Secondary | ICD-10-CM | POA: Diagnosis not present

## 2017-05-22 DIAGNOSIS — J449 Chronic obstructive pulmonary disease, unspecified: Secondary | ICD-10-CM | POA: Diagnosis not present

## 2017-05-22 DIAGNOSIS — L97222 Non-pressure chronic ulcer of left calf with fat layer exposed: Secondary | ICD-10-CM | POA: Diagnosis not present

## 2017-05-22 DIAGNOSIS — G4733 Obstructive sleep apnea (adult) (pediatric): Secondary | ICD-10-CM | POA: Diagnosis not present

## 2017-05-22 DIAGNOSIS — E119 Type 2 diabetes mellitus without complications: Secondary | ICD-10-CM | POA: Diagnosis not present

## 2017-05-22 DIAGNOSIS — R69 Illness, unspecified: Secondary | ICD-10-CM | POA: Diagnosis not present

## 2017-05-22 DIAGNOSIS — I509 Heart failure, unspecified: Secondary | ICD-10-CM | POA: Diagnosis not present

## 2017-05-22 DIAGNOSIS — G2581 Restless legs syndrome: Secondary | ICD-10-CM | POA: Diagnosis not present

## 2017-05-22 DIAGNOSIS — Z853 Personal history of malignant neoplasm of breast: Secondary | ICD-10-CM | POA: Diagnosis not present

## 2017-05-22 DIAGNOSIS — Z8572 Personal history of non-Hodgkin lymphomas: Secondary | ICD-10-CM | POA: Diagnosis not present

## 2017-05-22 DIAGNOSIS — I872 Venous insufficiency (chronic) (peripheral): Secondary | ICD-10-CM | POA: Diagnosis not present

## 2017-05-24 DIAGNOSIS — L97222 Non-pressure chronic ulcer of left calf with fat layer exposed: Secondary | ICD-10-CM | POA: Diagnosis not present

## 2017-05-24 DIAGNOSIS — G4733 Obstructive sleep apnea (adult) (pediatric): Secondary | ICD-10-CM | POA: Diagnosis not present

## 2017-05-24 DIAGNOSIS — R69 Illness, unspecified: Secondary | ICD-10-CM | POA: Diagnosis not present

## 2017-05-24 DIAGNOSIS — E119 Type 2 diabetes mellitus without complications: Secondary | ICD-10-CM | POA: Diagnosis not present

## 2017-05-24 DIAGNOSIS — Z853 Personal history of malignant neoplasm of breast: Secondary | ICD-10-CM | POA: Diagnosis not present

## 2017-05-24 DIAGNOSIS — G2581 Restless legs syndrome: Secondary | ICD-10-CM | POA: Diagnosis not present

## 2017-05-24 DIAGNOSIS — Z8572 Personal history of non-Hodgkin lymphomas: Secondary | ICD-10-CM | POA: Diagnosis not present

## 2017-05-24 DIAGNOSIS — I509 Heart failure, unspecified: Secondary | ICD-10-CM | POA: Diagnosis not present

## 2017-05-24 DIAGNOSIS — I872 Venous insufficiency (chronic) (peripheral): Secondary | ICD-10-CM | POA: Diagnosis not present

## 2017-05-24 DIAGNOSIS — J449 Chronic obstructive pulmonary disease, unspecified: Secondary | ICD-10-CM | POA: Diagnosis not present

## 2017-05-27 DIAGNOSIS — R69 Illness, unspecified: Secondary | ICD-10-CM | POA: Diagnosis not present

## 2017-05-27 DIAGNOSIS — G2581 Restless legs syndrome: Secondary | ICD-10-CM | POA: Diagnosis not present

## 2017-05-27 DIAGNOSIS — Z853 Personal history of malignant neoplasm of breast: Secondary | ICD-10-CM | POA: Diagnosis not present

## 2017-05-27 DIAGNOSIS — E119 Type 2 diabetes mellitus without complications: Secondary | ICD-10-CM | POA: Diagnosis not present

## 2017-05-27 DIAGNOSIS — J449 Chronic obstructive pulmonary disease, unspecified: Secondary | ICD-10-CM | POA: Diagnosis not present

## 2017-05-27 DIAGNOSIS — I872 Venous insufficiency (chronic) (peripheral): Secondary | ICD-10-CM | POA: Diagnosis not present

## 2017-05-27 DIAGNOSIS — I509 Heart failure, unspecified: Secondary | ICD-10-CM | POA: Diagnosis not present

## 2017-05-27 DIAGNOSIS — L97222 Non-pressure chronic ulcer of left calf with fat layer exposed: Secondary | ICD-10-CM | POA: Diagnosis not present

## 2017-05-27 DIAGNOSIS — Z8572 Personal history of non-Hodgkin lymphomas: Secondary | ICD-10-CM | POA: Diagnosis not present

## 2017-05-27 DIAGNOSIS — G4733 Obstructive sleep apnea (adult) (pediatric): Secondary | ICD-10-CM | POA: Diagnosis not present

## 2017-05-28 DIAGNOSIS — L97222 Non-pressure chronic ulcer of left calf with fat layer exposed: Secondary | ICD-10-CM | POA: Diagnosis not present

## 2017-05-28 DIAGNOSIS — I872 Venous insufficiency (chronic) (peripheral): Secondary | ICD-10-CM | POA: Diagnosis not present

## 2017-05-28 DIAGNOSIS — L97828 Non-pressure chronic ulcer of other part of left lower leg with other specified severity: Secondary | ICD-10-CM | POA: Diagnosis not present

## 2017-05-29 DIAGNOSIS — Z8572 Personal history of non-Hodgkin lymphomas: Secondary | ICD-10-CM | POA: Diagnosis not present

## 2017-05-29 DIAGNOSIS — G4733 Obstructive sleep apnea (adult) (pediatric): Secondary | ICD-10-CM | POA: Diagnosis not present

## 2017-05-29 DIAGNOSIS — R69 Illness, unspecified: Secondary | ICD-10-CM | POA: Diagnosis not present

## 2017-05-29 DIAGNOSIS — J449 Chronic obstructive pulmonary disease, unspecified: Secondary | ICD-10-CM | POA: Diagnosis not present

## 2017-05-29 DIAGNOSIS — L97222 Non-pressure chronic ulcer of left calf with fat layer exposed: Secondary | ICD-10-CM | POA: Diagnosis not present

## 2017-05-29 DIAGNOSIS — Z853 Personal history of malignant neoplasm of breast: Secondary | ICD-10-CM | POA: Diagnosis not present

## 2017-05-29 DIAGNOSIS — G2581 Restless legs syndrome: Secondary | ICD-10-CM | POA: Diagnosis not present

## 2017-05-29 DIAGNOSIS — E119 Type 2 diabetes mellitus without complications: Secondary | ICD-10-CM | POA: Diagnosis not present

## 2017-05-29 DIAGNOSIS — I872 Venous insufficiency (chronic) (peripheral): Secondary | ICD-10-CM | POA: Diagnosis not present

## 2017-05-29 DIAGNOSIS — I509 Heart failure, unspecified: Secondary | ICD-10-CM | POA: Diagnosis not present

## 2017-05-31 DIAGNOSIS — G2581 Restless legs syndrome: Secondary | ICD-10-CM | POA: Diagnosis not present

## 2017-05-31 DIAGNOSIS — J449 Chronic obstructive pulmonary disease, unspecified: Secondary | ICD-10-CM | POA: Diagnosis not present

## 2017-05-31 DIAGNOSIS — R69 Illness, unspecified: Secondary | ICD-10-CM | POA: Diagnosis not present

## 2017-05-31 DIAGNOSIS — E119 Type 2 diabetes mellitus without complications: Secondary | ICD-10-CM | POA: Diagnosis not present

## 2017-05-31 DIAGNOSIS — Z8572 Personal history of non-Hodgkin lymphomas: Secondary | ICD-10-CM | POA: Diagnosis not present

## 2017-05-31 DIAGNOSIS — Z853 Personal history of malignant neoplasm of breast: Secondary | ICD-10-CM | POA: Diagnosis not present

## 2017-05-31 DIAGNOSIS — I872 Venous insufficiency (chronic) (peripheral): Secondary | ICD-10-CM | POA: Diagnosis not present

## 2017-05-31 DIAGNOSIS — G4733 Obstructive sleep apnea (adult) (pediatric): Secondary | ICD-10-CM | POA: Diagnosis not present

## 2017-05-31 DIAGNOSIS — I509 Heart failure, unspecified: Secondary | ICD-10-CM | POA: Diagnosis not present

## 2017-05-31 DIAGNOSIS — L97222 Non-pressure chronic ulcer of left calf with fat layer exposed: Secondary | ICD-10-CM | POA: Diagnosis not present

## 2017-06-03 DIAGNOSIS — E119 Type 2 diabetes mellitus without complications: Secondary | ICD-10-CM | POA: Diagnosis not present

## 2017-06-03 DIAGNOSIS — L97222 Non-pressure chronic ulcer of left calf with fat layer exposed: Secondary | ICD-10-CM | POA: Diagnosis not present

## 2017-06-03 DIAGNOSIS — Z853 Personal history of malignant neoplasm of breast: Secondary | ICD-10-CM | POA: Diagnosis not present

## 2017-06-03 DIAGNOSIS — J449 Chronic obstructive pulmonary disease, unspecified: Secondary | ICD-10-CM | POA: Diagnosis not present

## 2017-06-03 DIAGNOSIS — I509 Heart failure, unspecified: Secondary | ICD-10-CM | POA: Diagnosis not present

## 2017-06-03 DIAGNOSIS — I872 Venous insufficiency (chronic) (peripheral): Secondary | ICD-10-CM | POA: Diagnosis not present

## 2017-06-03 DIAGNOSIS — Z8572 Personal history of non-Hodgkin lymphomas: Secondary | ICD-10-CM | POA: Diagnosis not present

## 2017-06-03 DIAGNOSIS — R69 Illness, unspecified: Secondary | ICD-10-CM | POA: Diagnosis not present

## 2017-06-03 DIAGNOSIS — G2581 Restless legs syndrome: Secondary | ICD-10-CM | POA: Diagnosis not present

## 2017-06-03 DIAGNOSIS — G4733 Obstructive sleep apnea (adult) (pediatric): Secondary | ICD-10-CM | POA: Diagnosis not present

## 2017-06-04 DIAGNOSIS — L98499 Non-pressure chronic ulcer of skin of other sites with unspecified severity: Secondary | ICD-10-CM | POA: Diagnosis not present

## 2017-06-04 DIAGNOSIS — E11622 Type 2 diabetes mellitus with other skin ulcer: Secondary | ICD-10-CM | POA: Diagnosis not present

## 2017-06-04 DIAGNOSIS — I509 Heart failure, unspecified: Secondary | ICD-10-CM | POA: Diagnosis not present

## 2017-06-04 DIAGNOSIS — E1142 Type 2 diabetes mellitus with diabetic polyneuropathy: Secondary | ICD-10-CM | POA: Diagnosis not present

## 2017-06-04 DIAGNOSIS — I87312 Chronic venous hypertension (idiopathic) with ulcer of left lower extremity: Secondary | ICD-10-CM | POA: Diagnosis not present

## 2017-06-04 DIAGNOSIS — Z6841 Body Mass Index (BMI) 40.0 and over, adult: Secondary | ICD-10-CM | POA: Diagnosis not present

## 2017-06-04 DIAGNOSIS — E1136 Type 2 diabetes mellitus with diabetic cataract: Secondary | ICD-10-CM | POA: Diagnosis not present

## 2017-06-04 DIAGNOSIS — E039 Hypothyroidism, unspecified: Secondary | ICD-10-CM | POA: Diagnosis not present

## 2017-06-04 DIAGNOSIS — I110 Hypertensive heart disease with heart failure: Secondary | ICD-10-CM | POA: Diagnosis not present

## 2017-06-05 DIAGNOSIS — I509 Heart failure, unspecified: Secondary | ICD-10-CM | POA: Diagnosis not present

## 2017-06-05 DIAGNOSIS — E119 Type 2 diabetes mellitus without complications: Secondary | ICD-10-CM | POA: Diagnosis not present

## 2017-06-05 DIAGNOSIS — J449 Chronic obstructive pulmonary disease, unspecified: Secondary | ICD-10-CM | POA: Diagnosis not present

## 2017-06-05 DIAGNOSIS — G2581 Restless legs syndrome: Secondary | ICD-10-CM | POA: Diagnosis not present

## 2017-06-05 DIAGNOSIS — R69 Illness, unspecified: Secondary | ICD-10-CM | POA: Diagnosis not present

## 2017-06-05 DIAGNOSIS — Z853 Personal history of malignant neoplasm of breast: Secondary | ICD-10-CM | POA: Diagnosis not present

## 2017-06-05 DIAGNOSIS — I872 Venous insufficiency (chronic) (peripheral): Secondary | ICD-10-CM | POA: Diagnosis not present

## 2017-06-05 DIAGNOSIS — G4733 Obstructive sleep apnea (adult) (pediatric): Secondary | ICD-10-CM | POA: Diagnosis not present

## 2017-06-05 DIAGNOSIS — Z8572 Personal history of non-Hodgkin lymphomas: Secondary | ICD-10-CM | POA: Diagnosis not present

## 2017-06-05 DIAGNOSIS — L97222 Non-pressure chronic ulcer of left calf with fat layer exposed: Secondary | ICD-10-CM | POA: Diagnosis not present

## 2017-06-07 DIAGNOSIS — Z8572 Personal history of non-Hodgkin lymphomas: Secondary | ICD-10-CM | POA: Diagnosis not present

## 2017-06-07 DIAGNOSIS — Z853 Personal history of malignant neoplasm of breast: Secondary | ICD-10-CM | POA: Diagnosis not present

## 2017-06-07 DIAGNOSIS — I509 Heart failure, unspecified: Secondary | ICD-10-CM | POA: Diagnosis not present

## 2017-06-07 DIAGNOSIS — R69 Illness, unspecified: Secondary | ICD-10-CM | POA: Diagnosis not present

## 2017-06-07 DIAGNOSIS — G2581 Restless legs syndrome: Secondary | ICD-10-CM | POA: Diagnosis not present

## 2017-06-07 DIAGNOSIS — I872 Venous insufficiency (chronic) (peripheral): Secondary | ICD-10-CM | POA: Diagnosis not present

## 2017-06-07 DIAGNOSIS — L97222 Non-pressure chronic ulcer of left calf with fat layer exposed: Secondary | ICD-10-CM | POA: Diagnosis not present

## 2017-06-07 DIAGNOSIS — E119 Type 2 diabetes mellitus without complications: Secondary | ICD-10-CM | POA: Diagnosis not present

## 2017-06-07 DIAGNOSIS — G4733 Obstructive sleep apnea (adult) (pediatric): Secondary | ICD-10-CM | POA: Diagnosis not present

## 2017-06-07 DIAGNOSIS — J449 Chronic obstructive pulmonary disease, unspecified: Secondary | ICD-10-CM | POA: Diagnosis not present

## 2017-06-10 DIAGNOSIS — Z853 Personal history of malignant neoplasm of breast: Secondary | ICD-10-CM | POA: Diagnosis not present

## 2017-06-10 DIAGNOSIS — J449 Chronic obstructive pulmonary disease, unspecified: Secondary | ICD-10-CM | POA: Diagnosis not present

## 2017-06-10 DIAGNOSIS — G4733 Obstructive sleep apnea (adult) (pediatric): Secondary | ICD-10-CM | POA: Diagnosis not present

## 2017-06-10 DIAGNOSIS — I872 Venous insufficiency (chronic) (peripheral): Secondary | ICD-10-CM | POA: Diagnosis not present

## 2017-06-10 DIAGNOSIS — G2581 Restless legs syndrome: Secondary | ICD-10-CM | POA: Diagnosis not present

## 2017-06-10 DIAGNOSIS — R69 Illness, unspecified: Secondary | ICD-10-CM | POA: Diagnosis not present

## 2017-06-10 DIAGNOSIS — I509 Heart failure, unspecified: Secondary | ICD-10-CM | POA: Diagnosis not present

## 2017-06-10 DIAGNOSIS — Z8572 Personal history of non-Hodgkin lymphomas: Secondary | ICD-10-CM | POA: Diagnosis not present

## 2017-06-10 DIAGNOSIS — E119 Type 2 diabetes mellitus without complications: Secondary | ICD-10-CM | POA: Diagnosis not present

## 2017-06-10 DIAGNOSIS — L97222 Non-pressure chronic ulcer of left calf with fat layer exposed: Secondary | ICD-10-CM | POA: Diagnosis not present

## 2017-06-12 DIAGNOSIS — G2581 Restless legs syndrome: Secondary | ICD-10-CM | POA: Diagnosis not present

## 2017-06-12 DIAGNOSIS — L97222 Non-pressure chronic ulcer of left calf with fat layer exposed: Secondary | ICD-10-CM | POA: Diagnosis not present

## 2017-06-12 DIAGNOSIS — G4733 Obstructive sleep apnea (adult) (pediatric): Secondary | ICD-10-CM | POA: Diagnosis not present

## 2017-06-12 DIAGNOSIS — J449 Chronic obstructive pulmonary disease, unspecified: Secondary | ICD-10-CM | POA: Diagnosis not present

## 2017-06-12 DIAGNOSIS — I509 Heart failure, unspecified: Secondary | ICD-10-CM | POA: Diagnosis not present

## 2017-06-12 DIAGNOSIS — R69 Illness, unspecified: Secondary | ICD-10-CM | POA: Diagnosis not present

## 2017-06-12 DIAGNOSIS — Z8572 Personal history of non-Hodgkin lymphomas: Secondary | ICD-10-CM | POA: Diagnosis not present

## 2017-06-12 DIAGNOSIS — I872 Venous insufficiency (chronic) (peripheral): Secondary | ICD-10-CM | POA: Diagnosis not present

## 2017-06-12 DIAGNOSIS — Z853 Personal history of malignant neoplasm of breast: Secondary | ICD-10-CM | POA: Diagnosis not present

## 2017-06-12 DIAGNOSIS — E119 Type 2 diabetes mellitus without complications: Secondary | ICD-10-CM | POA: Diagnosis not present

## 2017-06-14 DIAGNOSIS — G4733 Obstructive sleep apnea (adult) (pediatric): Secondary | ICD-10-CM | POA: Diagnosis not present

## 2017-06-14 DIAGNOSIS — G2581 Restless legs syndrome: Secondary | ICD-10-CM | POA: Diagnosis not present

## 2017-06-14 DIAGNOSIS — J449 Chronic obstructive pulmonary disease, unspecified: Secondary | ICD-10-CM | POA: Diagnosis not present

## 2017-06-14 DIAGNOSIS — R69 Illness, unspecified: Secondary | ICD-10-CM | POA: Diagnosis not present

## 2017-06-14 DIAGNOSIS — Z853 Personal history of malignant neoplasm of breast: Secondary | ICD-10-CM | POA: Diagnosis not present

## 2017-06-14 DIAGNOSIS — I509 Heart failure, unspecified: Secondary | ICD-10-CM | POA: Diagnosis not present

## 2017-06-14 DIAGNOSIS — I872 Venous insufficiency (chronic) (peripheral): Secondary | ICD-10-CM | POA: Diagnosis not present

## 2017-06-14 DIAGNOSIS — E119 Type 2 diabetes mellitus without complications: Secondary | ICD-10-CM | POA: Diagnosis not present

## 2017-06-14 DIAGNOSIS — L97222 Non-pressure chronic ulcer of left calf with fat layer exposed: Secondary | ICD-10-CM | POA: Diagnosis not present

## 2017-06-14 DIAGNOSIS — Z8572 Personal history of non-Hodgkin lymphomas: Secondary | ICD-10-CM | POA: Diagnosis not present

## 2017-06-17 DIAGNOSIS — Z8572 Personal history of non-Hodgkin lymphomas: Secondary | ICD-10-CM | POA: Diagnosis not present

## 2017-06-17 DIAGNOSIS — I872 Venous insufficiency (chronic) (peripheral): Secondary | ICD-10-CM | POA: Diagnosis not present

## 2017-06-17 DIAGNOSIS — G2581 Restless legs syndrome: Secondary | ICD-10-CM | POA: Diagnosis not present

## 2017-06-17 DIAGNOSIS — R69 Illness, unspecified: Secondary | ICD-10-CM | POA: Diagnosis not present

## 2017-06-17 DIAGNOSIS — J449 Chronic obstructive pulmonary disease, unspecified: Secondary | ICD-10-CM | POA: Diagnosis not present

## 2017-06-17 DIAGNOSIS — E119 Type 2 diabetes mellitus without complications: Secondary | ICD-10-CM | POA: Diagnosis not present

## 2017-06-17 DIAGNOSIS — L97222 Non-pressure chronic ulcer of left calf with fat layer exposed: Secondary | ICD-10-CM | POA: Diagnosis not present

## 2017-06-17 DIAGNOSIS — Z853 Personal history of malignant neoplasm of breast: Secondary | ICD-10-CM | POA: Diagnosis not present

## 2017-06-17 DIAGNOSIS — G4733 Obstructive sleep apnea (adult) (pediatric): Secondary | ICD-10-CM | POA: Diagnosis not present

## 2017-06-17 DIAGNOSIS — I509 Heart failure, unspecified: Secondary | ICD-10-CM | POA: Diagnosis not present

## 2017-06-18 DIAGNOSIS — I10 Essential (primary) hypertension: Secondary | ICD-10-CM | POA: Diagnosis not present

## 2017-06-18 DIAGNOSIS — N644 Mastodynia: Secondary | ICD-10-CM | POA: Diagnosis not present

## 2017-06-18 DIAGNOSIS — E1165 Type 2 diabetes mellitus with hyperglycemia: Secondary | ICD-10-CM | POA: Diagnosis not present

## 2017-06-18 DIAGNOSIS — E782 Mixed hyperlipidemia: Secondary | ICD-10-CM | POA: Diagnosis not present

## 2017-06-19 DIAGNOSIS — Z853 Personal history of malignant neoplasm of breast: Secondary | ICD-10-CM | POA: Diagnosis not present

## 2017-06-19 DIAGNOSIS — J449 Chronic obstructive pulmonary disease, unspecified: Secondary | ICD-10-CM | POA: Diagnosis not present

## 2017-06-19 DIAGNOSIS — I509 Heart failure, unspecified: Secondary | ICD-10-CM | POA: Diagnosis not present

## 2017-06-19 DIAGNOSIS — Z8572 Personal history of non-Hodgkin lymphomas: Secondary | ICD-10-CM | POA: Diagnosis not present

## 2017-06-19 DIAGNOSIS — R69 Illness, unspecified: Secondary | ICD-10-CM | POA: Diagnosis not present

## 2017-06-19 DIAGNOSIS — I872 Venous insufficiency (chronic) (peripheral): Secondary | ICD-10-CM | POA: Diagnosis not present

## 2017-06-19 DIAGNOSIS — L97222 Non-pressure chronic ulcer of left calf with fat layer exposed: Secondary | ICD-10-CM | POA: Diagnosis not present

## 2017-06-19 DIAGNOSIS — G4733 Obstructive sleep apnea (adult) (pediatric): Secondary | ICD-10-CM | POA: Diagnosis not present

## 2017-06-19 DIAGNOSIS — G2581 Restless legs syndrome: Secondary | ICD-10-CM | POA: Diagnosis not present

## 2017-06-19 DIAGNOSIS — E119 Type 2 diabetes mellitus without complications: Secondary | ICD-10-CM | POA: Diagnosis not present

## 2017-06-21 DIAGNOSIS — I509 Heart failure, unspecified: Secondary | ICD-10-CM | POA: Diagnosis not present

## 2017-06-21 DIAGNOSIS — Z8572 Personal history of non-Hodgkin lymphomas: Secondary | ICD-10-CM | POA: Diagnosis not present

## 2017-06-21 DIAGNOSIS — G4733 Obstructive sleep apnea (adult) (pediatric): Secondary | ICD-10-CM | POA: Diagnosis not present

## 2017-06-21 DIAGNOSIS — I872 Venous insufficiency (chronic) (peripheral): Secondary | ICD-10-CM | POA: Diagnosis not present

## 2017-06-21 DIAGNOSIS — L97222 Non-pressure chronic ulcer of left calf with fat layer exposed: Secondary | ICD-10-CM | POA: Diagnosis not present

## 2017-06-21 DIAGNOSIS — J449 Chronic obstructive pulmonary disease, unspecified: Secondary | ICD-10-CM | POA: Diagnosis not present

## 2017-06-21 DIAGNOSIS — E119 Type 2 diabetes mellitus without complications: Secondary | ICD-10-CM | POA: Diagnosis not present

## 2017-06-21 DIAGNOSIS — R69 Illness, unspecified: Secondary | ICD-10-CM | POA: Diagnosis not present

## 2017-06-21 DIAGNOSIS — G2581 Restless legs syndrome: Secondary | ICD-10-CM | POA: Diagnosis not present

## 2017-06-21 DIAGNOSIS — Z853 Personal history of malignant neoplasm of breast: Secondary | ICD-10-CM | POA: Diagnosis not present

## 2017-06-24 DIAGNOSIS — R69 Illness, unspecified: Secondary | ICD-10-CM | POA: Diagnosis not present

## 2017-06-24 DIAGNOSIS — I509 Heart failure, unspecified: Secondary | ICD-10-CM | POA: Diagnosis not present

## 2017-06-24 DIAGNOSIS — Z8572 Personal history of non-Hodgkin lymphomas: Secondary | ICD-10-CM | POA: Diagnosis not present

## 2017-06-24 DIAGNOSIS — J449 Chronic obstructive pulmonary disease, unspecified: Secondary | ICD-10-CM | POA: Diagnosis not present

## 2017-06-24 DIAGNOSIS — L97222 Non-pressure chronic ulcer of left calf with fat layer exposed: Secondary | ICD-10-CM | POA: Diagnosis not present

## 2017-06-24 DIAGNOSIS — I872 Venous insufficiency (chronic) (peripheral): Secondary | ICD-10-CM | POA: Diagnosis not present

## 2017-06-24 DIAGNOSIS — Z853 Personal history of malignant neoplasm of breast: Secondary | ICD-10-CM | POA: Diagnosis not present

## 2017-06-24 DIAGNOSIS — E119 Type 2 diabetes mellitus without complications: Secondary | ICD-10-CM | POA: Diagnosis not present

## 2017-06-24 DIAGNOSIS — G2581 Restless legs syndrome: Secondary | ICD-10-CM | POA: Diagnosis not present

## 2017-06-24 DIAGNOSIS — G4733 Obstructive sleep apnea (adult) (pediatric): Secondary | ICD-10-CM | POA: Diagnosis not present

## 2017-06-26 DIAGNOSIS — L97222 Non-pressure chronic ulcer of left calf with fat layer exposed: Secondary | ICD-10-CM | POA: Diagnosis not present

## 2017-06-26 DIAGNOSIS — Z8572 Personal history of non-Hodgkin lymphomas: Secondary | ICD-10-CM | POA: Diagnosis not present

## 2017-06-26 DIAGNOSIS — I872 Venous insufficiency (chronic) (peripheral): Secondary | ICD-10-CM | POA: Diagnosis not present

## 2017-06-26 DIAGNOSIS — G4733 Obstructive sleep apnea (adult) (pediatric): Secondary | ICD-10-CM | POA: Diagnosis not present

## 2017-06-26 DIAGNOSIS — G2581 Restless legs syndrome: Secondary | ICD-10-CM | POA: Diagnosis not present

## 2017-06-26 DIAGNOSIS — J449 Chronic obstructive pulmonary disease, unspecified: Secondary | ICD-10-CM | POA: Diagnosis not present

## 2017-06-26 DIAGNOSIS — E119 Type 2 diabetes mellitus without complications: Secondary | ICD-10-CM | POA: Diagnosis not present

## 2017-06-26 DIAGNOSIS — I509 Heart failure, unspecified: Secondary | ICD-10-CM | POA: Diagnosis not present

## 2017-06-26 DIAGNOSIS — R69 Illness, unspecified: Secondary | ICD-10-CM | POA: Diagnosis not present

## 2017-06-26 DIAGNOSIS — Z853 Personal history of malignant neoplasm of breast: Secondary | ICD-10-CM | POA: Diagnosis not present

## 2017-06-28 DIAGNOSIS — L97222 Non-pressure chronic ulcer of left calf with fat layer exposed: Secondary | ICD-10-CM | POA: Diagnosis not present

## 2017-06-28 DIAGNOSIS — I872 Venous insufficiency (chronic) (peripheral): Secondary | ICD-10-CM | POA: Diagnosis not present

## 2017-06-28 DIAGNOSIS — Z8572 Personal history of non-Hodgkin lymphomas: Secondary | ICD-10-CM | POA: Diagnosis not present

## 2017-06-28 DIAGNOSIS — R69 Illness, unspecified: Secondary | ICD-10-CM | POA: Diagnosis not present

## 2017-06-28 DIAGNOSIS — E119 Type 2 diabetes mellitus without complications: Secondary | ICD-10-CM | POA: Diagnosis not present

## 2017-06-28 DIAGNOSIS — I509 Heart failure, unspecified: Secondary | ICD-10-CM | POA: Diagnosis not present

## 2017-06-28 DIAGNOSIS — G2581 Restless legs syndrome: Secondary | ICD-10-CM | POA: Diagnosis not present

## 2017-06-28 DIAGNOSIS — G4733 Obstructive sleep apnea (adult) (pediatric): Secondary | ICD-10-CM | POA: Diagnosis not present

## 2017-06-28 DIAGNOSIS — Z853 Personal history of malignant neoplasm of breast: Secondary | ICD-10-CM | POA: Diagnosis not present

## 2017-06-28 DIAGNOSIS — J449 Chronic obstructive pulmonary disease, unspecified: Secondary | ICD-10-CM | POA: Diagnosis not present

## 2017-07-01 DIAGNOSIS — G2581 Restless legs syndrome: Secondary | ICD-10-CM | POA: Diagnosis not present

## 2017-07-01 DIAGNOSIS — J449 Chronic obstructive pulmonary disease, unspecified: Secondary | ICD-10-CM | POA: Diagnosis not present

## 2017-07-01 DIAGNOSIS — I872 Venous insufficiency (chronic) (peripheral): Secondary | ICD-10-CM | POA: Diagnosis not present

## 2017-07-01 DIAGNOSIS — G4733 Obstructive sleep apnea (adult) (pediatric): Secondary | ICD-10-CM | POA: Diagnosis not present

## 2017-07-01 DIAGNOSIS — I509 Heart failure, unspecified: Secondary | ICD-10-CM | POA: Diagnosis not present

## 2017-07-01 DIAGNOSIS — Z853 Personal history of malignant neoplasm of breast: Secondary | ICD-10-CM | POA: Diagnosis not present

## 2017-07-01 DIAGNOSIS — E119 Type 2 diabetes mellitus without complications: Secondary | ICD-10-CM | POA: Diagnosis not present

## 2017-07-01 DIAGNOSIS — L97222 Non-pressure chronic ulcer of left calf with fat layer exposed: Secondary | ICD-10-CM | POA: Diagnosis not present

## 2017-07-01 DIAGNOSIS — R69 Illness, unspecified: Secondary | ICD-10-CM | POA: Diagnosis not present

## 2017-07-01 DIAGNOSIS — Z8572 Personal history of non-Hodgkin lymphomas: Secondary | ICD-10-CM | POA: Diagnosis not present

## 2017-07-03 DIAGNOSIS — G4733 Obstructive sleep apnea (adult) (pediatric): Secondary | ICD-10-CM | POA: Diagnosis not present

## 2017-07-03 DIAGNOSIS — Z853 Personal history of malignant neoplasm of breast: Secondary | ICD-10-CM | POA: Diagnosis not present

## 2017-07-03 DIAGNOSIS — G2581 Restless legs syndrome: Secondary | ICD-10-CM | POA: Diagnosis not present

## 2017-07-03 DIAGNOSIS — J449 Chronic obstructive pulmonary disease, unspecified: Secondary | ICD-10-CM | POA: Diagnosis not present

## 2017-07-03 DIAGNOSIS — R69 Illness, unspecified: Secondary | ICD-10-CM | POA: Diagnosis not present

## 2017-07-03 DIAGNOSIS — Z8572 Personal history of non-Hodgkin lymphomas: Secondary | ICD-10-CM | POA: Diagnosis not present

## 2017-07-03 DIAGNOSIS — I509 Heart failure, unspecified: Secondary | ICD-10-CM | POA: Diagnosis not present

## 2017-07-03 DIAGNOSIS — I872 Venous insufficiency (chronic) (peripheral): Secondary | ICD-10-CM | POA: Diagnosis not present

## 2017-07-03 DIAGNOSIS — E119 Type 2 diabetes mellitus without complications: Secondary | ICD-10-CM | POA: Diagnosis not present

## 2017-07-03 DIAGNOSIS — L97222 Non-pressure chronic ulcer of left calf with fat layer exposed: Secondary | ICD-10-CM | POA: Diagnosis not present

## 2017-07-04 DIAGNOSIS — L97828 Non-pressure chronic ulcer of other part of left lower leg with other specified severity: Secondary | ICD-10-CM | POA: Diagnosis not present

## 2017-07-04 DIAGNOSIS — L97222 Non-pressure chronic ulcer of left calf with fat layer exposed: Secondary | ICD-10-CM | POA: Diagnosis not present

## 2017-07-04 DIAGNOSIS — I872 Venous insufficiency (chronic) (peripheral): Secondary | ICD-10-CM | POA: Diagnosis not present

## 2017-07-05 DIAGNOSIS — E039 Hypothyroidism, unspecified: Secondary | ICD-10-CM | POA: Diagnosis not present

## 2017-07-05 DIAGNOSIS — I872 Venous insufficiency (chronic) (peripheral): Secondary | ICD-10-CM | POA: Diagnosis not present

## 2017-07-05 DIAGNOSIS — R252 Cramp and spasm: Secondary | ICD-10-CM | POA: Diagnosis not present

## 2017-07-05 DIAGNOSIS — Z853 Personal history of malignant neoplasm of breast: Secondary | ICD-10-CM | POA: Diagnosis not present

## 2017-07-05 DIAGNOSIS — G2581 Restless legs syndrome: Secondary | ICD-10-CM | POA: Diagnosis not present

## 2017-07-05 DIAGNOSIS — L97222 Non-pressure chronic ulcer of left calf with fat layer exposed: Secondary | ICD-10-CM | POA: Diagnosis not present

## 2017-07-05 DIAGNOSIS — E119 Type 2 diabetes mellitus without complications: Secondary | ICD-10-CM | POA: Diagnosis not present

## 2017-07-05 DIAGNOSIS — I509 Heart failure, unspecified: Secondary | ICD-10-CM | POA: Diagnosis not present

## 2017-07-05 DIAGNOSIS — R69 Illness, unspecified: Secondary | ICD-10-CM | POA: Diagnosis not present

## 2017-07-05 DIAGNOSIS — Z6841 Body Mass Index (BMI) 40.0 and over, adult: Secondary | ICD-10-CM | POA: Diagnosis not present

## 2017-07-05 DIAGNOSIS — G4733 Obstructive sleep apnea (adult) (pediatric): Secondary | ICD-10-CM | POA: Diagnosis not present

## 2017-07-05 DIAGNOSIS — I10 Essential (primary) hypertension: Secondary | ICD-10-CM | POA: Diagnosis not present

## 2017-07-05 DIAGNOSIS — Z8572 Personal history of non-Hodgkin lymphomas: Secondary | ICD-10-CM | POA: Diagnosis not present

## 2017-07-05 DIAGNOSIS — E1165 Type 2 diabetes mellitus with hyperglycemia: Secondary | ICD-10-CM | POA: Diagnosis not present

## 2017-07-05 DIAGNOSIS — R600 Localized edema: Secondary | ICD-10-CM | POA: Diagnosis not present

## 2017-07-05 DIAGNOSIS — J449 Chronic obstructive pulmonary disease, unspecified: Secondary | ICD-10-CM | POA: Diagnosis not present

## 2017-07-08 DIAGNOSIS — Z8572 Personal history of non-Hodgkin lymphomas: Secondary | ICD-10-CM | POA: Diagnosis not present

## 2017-07-08 DIAGNOSIS — E119 Type 2 diabetes mellitus without complications: Secondary | ICD-10-CM | POA: Diagnosis not present

## 2017-07-08 DIAGNOSIS — R69 Illness, unspecified: Secondary | ICD-10-CM | POA: Diagnosis not present

## 2017-07-08 DIAGNOSIS — I509 Heart failure, unspecified: Secondary | ICD-10-CM | POA: Diagnosis not present

## 2017-07-08 DIAGNOSIS — L97222 Non-pressure chronic ulcer of left calf with fat layer exposed: Secondary | ICD-10-CM | POA: Diagnosis not present

## 2017-07-08 DIAGNOSIS — G2581 Restless legs syndrome: Secondary | ICD-10-CM | POA: Diagnosis not present

## 2017-07-08 DIAGNOSIS — Z853 Personal history of malignant neoplasm of breast: Secondary | ICD-10-CM | POA: Diagnosis not present

## 2017-07-08 DIAGNOSIS — J449 Chronic obstructive pulmonary disease, unspecified: Secondary | ICD-10-CM | POA: Diagnosis not present

## 2017-07-08 DIAGNOSIS — I872 Venous insufficiency (chronic) (peripheral): Secondary | ICD-10-CM | POA: Diagnosis not present

## 2017-07-08 DIAGNOSIS — G4733 Obstructive sleep apnea (adult) (pediatric): Secondary | ICD-10-CM | POA: Diagnosis not present

## 2017-07-09 DIAGNOSIS — J449 Chronic obstructive pulmonary disease, unspecified: Secondary | ICD-10-CM | POA: Diagnosis not present

## 2017-07-09 DIAGNOSIS — G4733 Obstructive sleep apnea (adult) (pediatric): Secondary | ICD-10-CM | POA: Diagnosis not present

## 2017-07-09 DIAGNOSIS — R69 Illness, unspecified: Secondary | ICD-10-CM | POA: Diagnosis not present

## 2017-07-09 DIAGNOSIS — Z8572 Personal history of non-Hodgkin lymphomas: Secondary | ICD-10-CM | POA: Diagnosis not present

## 2017-07-09 DIAGNOSIS — G2581 Restless legs syndrome: Secondary | ICD-10-CM | POA: Diagnosis not present

## 2017-07-09 DIAGNOSIS — E119 Type 2 diabetes mellitus without complications: Secondary | ICD-10-CM | POA: Diagnosis not present

## 2017-07-09 DIAGNOSIS — L97222 Non-pressure chronic ulcer of left calf with fat layer exposed: Secondary | ICD-10-CM | POA: Diagnosis not present

## 2017-07-09 DIAGNOSIS — I872 Venous insufficiency (chronic) (peripheral): Secondary | ICD-10-CM | POA: Diagnosis not present

## 2017-07-09 DIAGNOSIS — Z853 Personal history of malignant neoplasm of breast: Secondary | ICD-10-CM | POA: Diagnosis not present

## 2017-07-09 DIAGNOSIS — I509 Heart failure, unspecified: Secondary | ICD-10-CM | POA: Diagnosis not present

## 2017-07-12 DIAGNOSIS — L97222 Non-pressure chronic ulcer of left calf with fat layer exposed: Secondary | ICD-10-CM | POA: Diagnosis not present

## 2017-07-12 DIAGNOSIS — G4733 Obstructive sleep apnea (adult) (pediatric): Secondary | ICD-10-CM | POA: Diagnosis not present

## 2017-07-12 DIAGNOSIS — Z853 Personal history of malignant neoplasm of breast: Secondary | ICD-10-CM | POA: Diagnosis not present

## 2017-07-12 DIAGNOSIS — R69 Illness, unspecified: Secondary | ICD-10-CM | POA: Diagnosis not present

## 2017-07-12 DIAGNOSIS — G2581 Restless legs syndrome: Secondary | ICD-10-CM | POA: Diagnosis not present

## 2017-07-12 DIAGNOSIS — Z8572 Personal history of non-Hodgkin lymphomas: Secondary | ICD-10-CM | POA: Diagnosis not present

## 2017-07-12 DIAGNOSIS — J449 Chronic obstructive pulmonary disease, unspecified: Secondary | ICD-10-CM | POA: Diagnosis not present

## 2017-07-12 DIAGNOSIS — E119 Type 2 diabetes mellitus without complications: Secondary | ICD-10-CM | POA: Diagnosis not present

## 2017-07-12 DIAGNOSIS — I509 Heart failure, unspecified: Secondary | ICD-10-CM | POA: Diagnosis not present

## 2017-07-12 DIAGNOSIS — I872 Venous insufficiency (chronic) (peripheral): Secondary | ICD-10-CM | POA: Diagnosis not present

## 2017-07-15 DIAGNOSIS — G4733 Obstructive sleep apnea (adult) (pediatric): Secondary | ICD-10-CM | POA: Diagnosis not present

## 2017-07-15 DIAGNOSIS — G2581 Restless legs syndrome: Secondary | ICD-10-CM | POA: Diagnosis not present

## 2017-07-15 DIAGNOSIS — I872 Venous insufficiency (chronic) (peripheral): Secondary | ICD-10-CM | POA: Diagnosis not present

## 2017-07-15 DIAGNOSIS — R69 Illness, unspecified: Secondary | ICD-10-CM | POA: Diagnosis not present

## 2017-07-15 DIAGNOSIS — Z853 Personal history of malignant neoplasm of breast: Secondary | ICD-10-CM | POA: Diagnosis not present

## 2017-07-15 DIAGNOSIS — Z8572 Personal history of non-Hodgkin lymphomas: Secondary | ICD-10-CM | POA: Diagnosis not present

## 2017-07-15 DIAGNOSIS — I509 Heart failure, unspecified: Secondary | ICD-10-CM | POA: Diagnosis not present

## 2017-07-15 DIAGNOSIS — E119 Type 2 diabetes mellitus without complications: Secondary | ICD-10-CM | POA: Diagnosis not present

## 2017-07-15 DIAGNOSIS — J449 Chronic obstructive pulmonary disease, unspecified: Secondary | ICD-10-CM | POA: Diagnosis not present

## 2017-07-15 DIAGNOSIS — L97222 Non-pressure chronic ulcer of left calf with fat layer exposed: Secondary | ICD-10-CM | POA: Diagnosis not present

## 2017-07-17 DIAGNOSIS — Z8572 Personal history of non-Hodgkin lymphomas: Secondary | ICD-10-CM | POA: Diagnosis not present

## 2017-07-17 DIAGNOSIS — L97222 Non-pressure chronic ulcer of left calf with fat layer exposed: Secondary | ICD-10-CM | POA: Diagnosis not present

## 2017-07-17 DIAGNOSIS — I509 Heart failure, unspecified: Secondary | ICD-10-CM | POA: Diagnosis not present

## 2017-07-17 DIAGNOSIS — J449 Chronic obstructive pulmonary disease, unspecified: Secondary | ICD-10-CM | POA: Diagnosis not present

## 2017-07-17 DIAGNOSIS — G4733 Obstructive sleep apnea (adult) (pediatric): Secondary | ICD-10-CM | POA: Diagnosis not present

## 2017-07-17 DIAGNOSIS — E119 Type 2 diabetes mellitus without complications: Secondary | ICD-10-CM | POA: Diagnosis not present

## 2017-07-17 DIAGNOSIS — I872 Venous insufficiency (chronic) (peripheral): Secondary | ICD-10-CM | POA: Diagnosis not present

## 2017-07-17 DIAGNOSIS — Z853 Personal history of malignant neoplasm of breast: Secondary | ICD-10-CM | POA: Diagnosis not present

## 2017-07-17 DIAGNOSIS — R69 Illness, unspecified: Secondary | ICD-10-CM | POA: Diagnosis not present

## 2017-07-17 DIAGNOSIS — G2581 Restless legs syndrome: Secondary | ICD-10-CM | POA: Diagnosis not present

## 2017-07-19 DIAGNOSIS — G2581 Restless legs syndrome: Secondary | ICD-10-CM | POA: Diagnosis not present

## 2017-07-19 DIAGNOSIS — I509 Heart failure, unspecified: Secondary | ICD-10-CM | POA: Diagnosis not present

## 2017-07-19 DIAGNOSIS — L97222 Non-pressure chronic ulcer of left calf with fat layer exposed: Secondary | ICD-10-CM | POA: Diagnosis not present

## 2017-07-19 DIAGNOSIS — I872 Venous insufficiency (chronic) (peripheral): Secondary | ICD-10-CM | POA: Diagnosis not present

## 2017-07-19 DIAGNOSIS — E119 Type 2 diabetes mellitus without complications: Secondary | ICD-10-CM | POA: Diagnosis not present

## 2017-07-19 DIAGNOSIS — Z87440 Personal history of urinary (tract) infections: Secondary | ICD-10-CM | POA: Diagnosis not present

## 2017-07-19 DIAGNOSIS — R69 Illness, unspecified: Secondary | ICD-10-CM | POA: Diagnosis not present

## 2017-07-19 DIAGNOSIS — Z8572 Personal history of non-Hodgkin lymphomas: Secondary | ICD-10-CM | POA: Diagnosis not present

## 2017-07-19 DIAGNOSIS — J449 Chronic obstructive pulmonary disease, unspecified: Secondary | ICD-10-CM | POA: Diagnosis not present

## 2017-07-19 DIAGNOSIS — G4733 Obstructive sleep apnea (adult) (pediatric): Secondary | ICD-10-CM | POA: Diagnosis not present

## 2017-07-20 DIAGNOSIS — R69 Illness, unspecified: Secondary | ICD-10-CM | POA: Diagnosis not present

## 2017-07-22 DIAGNOSIS — G2581 Restless legs syndrome: Secondary | ICD-10-CM | POA: Diagnosis not present

## 2017-07-22 DIAGNOSIS — E119 Type 2 diabetes mellitus without complications: Secondary | ICD-10-CM | POA: Diagnosis not present

## 2017-07-22 DIAGNOSIS — Z8572 Personal history of non-Hodgkin lymphomas: Secondary | ICD-10-CM | POA: Diagnosis not present

## 2017-07-22 DIAGNOSIS — I872 Venous insufficiency (chronic) (peripheral): Secondary | ICD-10-CM | POA: Diagnosis not present

## 2017-07-22 DIAGNOSIS — Z87440 Personal history of urinary (tract) infections: Secondary | ICD-10-CM | POA: Diagnosis not present

## 2017-07-22 DIAGNOSIS — L97222 Non-pressure chronic ulcer of left calf with fat layer exposed: Secondary | ICD-10-CM | POA: Diagnosis not present

## 2017-07-22 DIAGNOSIS — G4733 Obstructive sleep apnea (adult) (pediatric): Secondary | ICD-10-CM | POA: Diagnosis not present

## 2017-07-22 DIAGNOSIS — J449 Chronic obstructive pulmonary disease, unspecified: Secondary | ICD-10-CM | POA: Diagnosis not present

## 2017-07-22 DIAGNOSIS — I509 Heart failure, unspecified: Secondary | ICD-10-CM | POA: Diagnosis not present

## 2017-07-22 DIAGNOSIS — R69 Illness, unspecified: Secondary | ICD-10-CM | POA: Diagnosis not present

## 2017-07-23 DIAGNOSIS — L97222 Non-pressure chronic ulcer of left calf with fat layer exposed: Secondary | ICD-10-CM | POA: Diagnosis not present

## 2017-07-23 DIAGNOSIS — Z8572 Personal history of non-Hodgkin lymphomas: Secondary | ICD-10-CM | POA: Diagnosis not present

## 2017-07-23 DIAGNOSIS — R69 Illness, unspecified: Secondary | ICD-10-CM | POA: Diagnosis not present

## 2017-07-23 DIAGNOSIS — I872 Venous insufficiency (chronic) (peripheral): Secondary | ICD-10-CM | POA: Diagnosis not present

## 2017-07-23 DIAGNOSIS — J449 Chronic obstructive pulmonary disease, unspecified: Secondary | ICD-10-CM | POA: Diagnosis not present

## 2017-07-23 DIAGNOSIS — G4733 Obstructive sleep apnea (adult) (pediatric): Secondary | ICD-10-CM | POA: Diagnosis not present

## 2017-07-23 DIAGNOSIS — E119 Type 2 diabetes mellitus without complications: Secondary | ICD-10-CM | POA: Diagnosis not present

## 2017-07-23 DIAGNOSIS — G2581 Restless legs syndrome: Secondary | ICD-10-CM | POA: Diagnosis not present

## 2017-07-23 DIAGNOSIS — Z87440 Personal history of urinary (tract) infections: Secondary | ICD-10-CM | POA: Diagnosis not present

## 2017-07-23 DIAGNOSIS — I509 Heart failure, unspecified: Secondary | ICD-10-CM | POA: Diagnosis not present

## 2017-07-24 DIAGNOSIS — G4733 Obstructive sleep apnea (adult) (pediatric): Secondary | ICD-10-CM | POA: Diagnosis not present

## 2017-07-24 DIAGNOSIS — Z87440 Personal history of urinary (tract) infections: Secondary | ICD-10-CM | POA: Diagnosis not present

## 2017-07-24 DIAGNOSIS — G2581 Restless legs syndrome: Secondary | ICD-10-CM | POA: Diagnosis not present

## 2017-07-24 DIAGNOSIS — I509 Heart failure, unspecified: Secondary | ICD-10-CM | POA: Diagnosis not present

## 2017-07-24 DIAGNOSIS — I872 Venous insufficiency (chronic) (peripheral): Secondary | ICD-10-CM | POA: Diagnosis not present

## 2017-07-24 DIAGNOSIS — Z8572 Personal history of non-Hodgkin lymphomas: Secondary | ICD-10-CM | POA: Diagnosis not present

## 2017-07-24 DIAGNOSIS — E119 Type 2 diabetes mellitus without complications: Secondary | ICD-10-CM | POA: Diagnosis not present

## 2017-07-24 DIAGNOSIS — J449 Chronic obstructive pulmonary disease, unspecified: Secondary | ICD-10-CM | POA: Diagnosis not present

## 2017-07-24 DIAGNOSIS — R69 Illness, unspecified: Secondary | ICD-10-CM | POA: Diagnosis not present

## 2017-07-24 DIAGNOSIS — L97222 Non-pressure chronic ulcer of left calf with fat layer exposed: Secondary | ICD-10-CM | POA: Diagnosis not present

## 2017-07-26 DIAGNOSIS — Z8572 Personal history of non-Hodgkin lymphomas: Secondary | ICD-10-CM | POA: Diagnosis not present

## 2017-07-26 DIAGNOSIS — J449 Chronic obstructive pulmonary disease, unspecified: Secondary | ICD-10-CM | POA: Diagnosis not present

## 2017-07-26 DIAGNOSIS — L97222 Non-pressure chronic ulcer of left calf with fat layer exposed: Secondary | ICD-10-CM | POA: Diagnosis not present

## 2017-07-26 DIAGNOSIS — Z87440 Personal history of urinary (tract) infections: Secondary | ICD-10-CM | POA: Diagnosis not present

## 2017-07-26 DIAGNOSIS — R69 Illness, unspecified: Secondary | ICD-10-CM | POA: Diagnosis not present

## 2017-07-26 DIAGNOSIS — G2581 Restless legs syndrome: Secondary | ICD-10-CM | POA: Diagnosis not present

## 2017-07-26 DIAGNOSIS — I872 Venous insufficiency (chronic) (peripheral): Secondary | ICD-10-CM | POA: Diagnosis not present

## 2017-07-26 DIAGNOSIS — I509 Heart failure, unspecified: Secondary | ICD-10-CM | POA: Diagnosis not present

## 2017-07-26 DIAGNOSIS — G4733 Obstructive sleep apnea (adult) (pediatric): Secondary | ICD-10-CM | POA: Diagnosis not present

## 2017-07-26 DIAGNOSIS — E119 Type 2 diabetes mellitus without complications: Secondary | ICD-10-CM | POA: Diagnosis not present

## 2017-07-29 DIAGNOSIS — G4733 Obstructive sleep apnea (adult) (pediatric): Secondary | ICD-10-CM | POA: Diagnosis not present

## 2017-07-29 DIAGNOSIS — E119 Type 2 diabetes mellitus without complications: Secondary | ICD-10-CM | POA: Diagnosis not present

## 2017-07-29 DIAGNOSIS — I872 Venous insufficiency (chronic) (peripheral): Secondary | ICD-10-CM | POA: Diagnosis not present

## 2017-07-29 DIAGNOSIS — Z87440 Personal history of urinary (tract) infections: Secondary | ICD-10-CM | POA: Diagnosis not present

## 2017-07-29 DIAGNOSIS — L97222 Non-pressure chronic ulcer of left calf with fat layer exposed: Secondary | ICD-10-CM | POA: Diagnosis not present

## 2017-07-29 DIAGNOSIS — J449 Chronic obstructive pulmonary disease, unspecified: Secondary | ICD-10-CM | POA: Diagnosis not present

## 2017-07-29 DIAGNOSIS — R69 Illness, unspecified: Secondary | ICD-10-CM | POA: Diagnosis not present

## 2017-07-29 DIAGNOSIS — G2581 Restless legs syndrome: Secondary | ICD-10-CM | POA: Diagnosis not present

## 2017-07-29 DIAGNOSIS — Z8572 Personal history of non-Hodgkin lymphomas: Secondary | ICD-10-CM | POA: Diagnosis not present

## 2017-07-29 DIAGNOSIS — I509 Heart failure, unspecified: Secondary | ICD-10-CM | POA: Diagnosis not present

## 2017-07-31 DIAGNOSIS — E119 Type 2 diabetes mellitus without complications: Secondary | ICD-10-CM | POA: Diagnosis not present

## 2017-07-31 DIAGNOSIS — J449 Chronic obstructive pulmonary disease, unspecified: Secondary | ICD-10-CM | POA: Diagnosis not present

## 2017-07-31 DIAGNOSIS — I872 Venous insufficiency (chronic) (peripheral): Secondary | ICD-10-CM | POA: Diagnosis not present

## 2017-07-31 DIAGNOSIS — L97222 Non-pressure chronic ulcer of left calf with fat layer exposed: Secondary | ICD-10-CM | POA: Diagnosis not present

## 2017-07-31 DIAGNOSIS — Z87440 Personal history of urinary (tract) infections: Secondary | ICD-10-CM | POA: Diagnosis not present

## 2017-07-31 DIAGNOSIS — G4733 Obstructive sleep apnea (adult) (pediatric): Secondary | ICD-10-CM | POA: Diagnosis not present

## 2017-07-31 DIAGNOSIS — R69 Illness, unspecified: Secondary | ICD-10-CM | POA: Diagnosis not present

## 2017-07-31 DIAGNOSIS — I509 Heart failure, unspecified: Secondary | ICD-10-CM | POA: Diagnosis not present

## 2017-07-31 DIAGNOSIS — Z8572 Personal history of non-Hodgkin lymphomas: Secondary | ICD-10-CM | POA: Diagnosis not present

## 2017-07-31 DIAGNOSIS — G2581 Restless legs syndrome: Secondary | ICD-10-CM | POA: Diagnosis not present

## 2017-08-02 DIAGNOSIS — G2581 Restless legs syndrome: Secondary | ICD-10-CM | POA: Diagnosis not present

## 2017-08-02 DIAGNOSIS — J449 Chronic obstructive pulmonary disease, unspecified: Secondary | ICD-10-CM | POA: Diagnosis not present

## 2017-08-02 DIAGNOSIS — I509 Heart failure, unspecified: Secondary | ICD-10-CM | POA: Diagnosis not present

## 2017-08-02 DIAGNOSIS — L97222 Non-pressure chronic ulcer of left calf with fat layer exposed: Secondary | ICD-10-CM | POA: Diagnosis not present

## 2017-08-02 DIAGNOSIS — R69 Illness, unspecified: Secondary | ICD-10-CM | POA: Diagnosis not present

## 2017-08-02 DIAGNOSIS — Z87440 Personal history of urinary (tract) infections: Secondary | ICD-10-CM | POA: Diagnosis not present

## 2017-08-02 DIAGNOSIS — E119 Type 2 diabetes mellitus without complications: Secondary | ICD-10-CM | POA: Diagnosis not present

## 2017-08-02 DIAGNOSIS — I872 Venous insufficiency (chronic) (peripheral): Secondary | ICD-10-CM | POA: Diagnosis not present

## 2017-08-02 DIAGNOSIS — G4733 Obstructive sleep apnea (adult) (pediatric): Secondary | ICD-10-CM | POA: Diagnosis not present

## 2017-08-02 DIAGNOSIS — Z8572 Personal history of non-Hodgkin lymphomas: Secondary | ICD-10-CM | POA: Diagnosis not present

## 2017-08-05 DIAGNOSIS — J449 Chronic obstructive pulmonary disease, unspecified: Secondary | ICD-10-CM | POA: Diagnosis not present

## 2017-08-05 DIAGNOSIS — G2581 Restless legs syndrome: Secondary | ICD-10-CM | POA: Diagnosis not present

## 2017-08-05 DIAGNOSIS — Z87440 Personal history of urinary (tract) infections: Secondary | ICD-10-CM | POA: Diagnosis not present

## 2017-08-05 DIAGNOSIS — E119 Type 2 diabetes mellitus without complications: Secondary | ICD-10-CM | POA: Diagnosis not present

## 2017-08-05 DIAGNOSIS — I509 Heart failure, unspecified: Secondary | ICD-10-CM | POA: Diagnosis not present

## 2017-08-05 DIAGNOSIS — I872 Venous insufficiency (chronic) (peripheral): Secondary | ICD-10-CM | POA: Diagnosis not present

## 2017-08-05 DIAGNOSIS — L97222 Non-pressure chronic ulcer of left calf with fat layer exposed: Secondary | ICD-10-CM | POA: Diagnosis not present

## 2017-08-05 DIAGNOSIS — R69 Illness, unspecified: Secondary | ICD-10-CM | POA: Diagnosis not present

## 2017-08-05 DIAGNOSIS — G4733 Obstructive sleep apnea (adult) (pediatric): Secondary | ICD-10-CM | POA: Diagnosis not present

## 2017-08-05 DIAGNOSIS — Z8572 Personal history of non-Hodgkin lymphomas: Secondary | ICD-10-CM | POA: Diagnosis not present

## 2017-08-06 DIAGNOSIS — G2581 Restless legs syndrome: Secondary | ICD-10-CM | POA: Diagnosis not present

## 2017-08-06 DIAGNOSIS — D509 Iron deficiency anemia, unspecified: Secondary | ICD-10-CM | POA: Diagnosis not present

## 2017-08-06 DIAGNOSIS — M62838 Other muscle spasm: Secondary | ICD-10-CM | POA: Diagnosis not present

## 2017-08-06 DIAGNOSIS — R52 Pain, unspecified: Secondary | ICD-10-CM | POA: Diagnosis not present

## 2017-08-07 DIAGNOSIS — L97222 Non-pressure chronic ulcer of left calf with fat layer exposed: Secondary | ICD-10-CM | POA: Diagnosis not present

## 2017-08-07 DIAGNOSIS — I872 Venous insufficiency (chronic) (peripheral): Secondary | ICD-10-CM | POA: Diagnosis not present

## 2017-08-07 DIAGNOSIS — L97828 Non-pressure chronic ulcer of other part of left lower leg with other specified severity: Secondary | ICD-10-CM | POA: Diagnosis not present

## 2017-08-09 DIAGNOSIS — I509 Heart failure, unspecified: Secondary | ICD-10-CM | POA: Diagnosis not present

## 2017-08-09 DIAGNOSIS — G4733 Obstructive sleep apnea (adult) (pediatric): Secondary | ICD-10-CM | POA: Diagnosis not present

## 2017-08-09 DIAGNOSIS — E119 Type 2 diabetes mellitus without complications: Secondary | ICD-10-CM | POA: Diagnosis not present

## 2017-08-09 DIAGNOSIS — I872 Venous insufficiency (chronic) (peripheral): Secondary | ICD-10-CM | POA: Diagnosis not present

## 2017-08-09 DIAGNOSIS — L97222 Non-pressure chronic ulcer of left calf with fat layer exposed: Secondary | ICD-10-CM | POA: Diagnosis not present

## 2017-08-09 DIAGNOSIS — Z87440 Personal history of urinary (tract) infections: Secondary | ICD-10-CM | POA: Diagnosis not present

## 2017-08-09 DIAGNOSIS — R69 Illness, unspecified: Secondary | ICD-10-CM | POA: Diagnosis not present

## 2017-08-09 DIAGNOSIS — Z8572 Personal history of non-Hodgkin lymphomas: Secondary | ICD-10-CM | POA: Diagnosis not present

## 2017-08-09 DIAGNOSIS — G2581 Restless legs syndrome: Secondary | ICD-10-CM | POA: Diagnosis not present

## 2017-08-09 DIAGNOSIS — J449 Chronic obstructive pulmonary disease, unspecified: Secondary | ICD-10-CM | POA: Diagnosis not present

## 2017-08-12 DIAGNOSIS — I872 Venous insufficiency (chronic) (peripheral): Secondary | ICD-10-CM | POA: Diagnosis not present

## 2017-08-12 DIAGNOSIS — Z8572 Personal history of non-Hodgkin lymphomas: Secondary | ICD-10-CM | POA: Diagnosis not present

## 2017-08-12 DIAGNOSIS — Z87440 Personal history of urinary (tract) infections: Secondary | ICD-10-CM | POA: Diagnosis not present

## 2017-08-12 DIAGNOSIS — I509 Heart failure, unspecified: Secondary | ICD-10-CM | POA: Diagnosis not present

## 2017-08-12 DIAGNOSIS — G4733 Obstructive sleep apnea (adult) (pediatric): Secondary | ICD-10-CM | POA: Diagnosis not present

## 2017-08-12 DIAGNOSIS — E119 Type 2 diabetes mellitus without complications: Secondary | ICD-10-CM | POA: Diagnosis not present

## 2017-08-12 DIAGNOSIS — R69 Illness, unspecified: Secondary | ICD-10-CM | POA: Diagnosis not present

## 2017-08-12 DIAGNOSIS — J449 Chronic obstructive pulmonary disease, unspecified: Secondary | ICD-10-CM | POA: Diagnosis not present

## 2017-08-12 DIAGNOSIS — L97222 Non-pressure chronic ulcer of left calf with fat layer exposed: Secondary | ICD-10-CM | POA: Diagnosis not present

## 2017-08-12 DIAGNOSIS — G2581 Restless legs syndrome: Secondary | ICD-10-CM | POA: Diagnosis not present

## 2017-08-14 DIAGNOSIS — Z87440 Personal history of urinary (tract) infections: Secondary | ICD-10-CM | POA: Diagnosis not present

## 2017-08-14 DIAGNOSIS — L97222 Non-pressure chronic ulcer of left calf with fat layer exposed: Secondary | ICD-10-CM | POA: Diagnosis not present

## 2017-08-14 DIAGNOSIS — J449 Chronic obstructive pulmonary disease, unspecified: Secondary | ICD-10-CM | POA: Diagnosis not present

## 2017-08-14 DIAGNOSIS — G4733 Obstructive sleep apnea (adult) (pediatric): Secondary | ICD-10-CM | POA: Diagnosis not present

## 2017-08-14 DIAGNOSIS — R69 Illness, unspecified: Secondary | ICD-10-CM | POA: Diagnosis not present

## 2017-08-14 DIAGNOSIS — G2581 Restless legs syndrome: Secondary | ICD-10-CM | POA: Diagnosis not present

## 2017-08-14 DIAGNOSIS — I872 Venous insufficiency (chronic) (peripheral): Secondary | ICD-10-CM | POA: Diagnosis not present

## 2017-08-14 DIAGNOSIS — Z8572 Personal history of non-Hodgkin lymphomas: Secondary | ICD-10-CM | POA: Diagnosis not present

## 2017-08-14 DIAGNOSIS — E119 Type 2 diabetes mellitus without complications: Secondary | ICD-10-CM | POA: Diagnosis not present

## 2017-08-14 DIAGNOSIS — I509 Heart failure, unspecified: Secondary | ICD-10-CM | POA: Diagnosis not present

## 2017-08-16 DIAGNOSIS — L97222 Non-pressure chronic ulcer of left calf with fat layer exposed: Secondary | ICD-10-CM | POA: Diagnosis not present

## 2017-08-16 DIAGNOSIS — L97828 Non-pressure chronic ulcer of other part of left lower leg with other specified severity: Secondary | ICD-10-CM | POA: Diagnosis not present

## 2017-08-16 DIAGNOSIS — I872 Venous insufficiency (chronic) (peripheral): Secondary | ICD-10-CM | POA: Diagnosis not present

## 2017-08-19 DIAGNOSIS — R69 Illness, unspecified: Secondary | ICD-10-CM | POA: Diagnosis not present

## 2017-08-19 DIAGNOSIS — I872 Venous insufficiency (chronic) (peripheral): Secondary | ICD-10-CM | POA: Diagnosis not present

## 2017-08-19 DIAGNOSIS — L97222 Non-pressure chronic ulcer of left calf with fat layer exposed: Secondary | ICD-10-CM | POA: Diagnosis not present

## 2017-08-19 DIAGNOSIS — J449 Chronic obstructive pulmonary disease, unspecified: Secondary | ICD-10-CM | POA: Diagnosis not present

## 2017-08-19 DIAGNOSIS — Z8572 Personal history of non-Hodgkin lymphomas: Secondary | ICD-10-CM | POA: Diagnosis not present

## 2017-08-19 DIAGNOSIS — E119 Type 2 diabetes mellitus without complications: Secondary | ICD-10-CM | POA: Diagnosis not present

## 2017-08-19 DIAGNOSIS — G2581 Restless legs syndrome: Secondary | ICD-10-CM | POA: Diagnosis not present

## 2017-08-19 DIAGNOSIS — G4733 Obstructive sleep apnea (adult) (pediatric): Secondary | ICD-10-CM | POA: Diagnosis not present

## 2017-08-19 DIAGNOSIS — Z87440 Personal history of urinary (tract) infections: Secondary | ICD-10-CM | POA: Diagnosis not present

## 2017-08-19 DIAGNOSIS — I509 Heart failure, unspecified: Secondary | ICD-10-CM | POA: Diagnosis not present

## 2017-08-21 DIAGNOSIS — I872 Venous insufficiency (chronic) (peripheral): Secondary | ICD-10-CM | POA: Diagnosis not present

## 2017-08-21 DIAGNOSIS — Z87440 Personal history of urinary (tract) infections: Secondary | ICD-10-CM | POA: Diagnosis not present

## 2017-08-21 DIAGNOSIS — G2581 Restless legs syndrome: Secondary | ICD-10-CM | POA: Diagnosis not present

## 2017-08-21 DIAGNOSIS — R69 Illness, unspecified: Secondary | ICD-10-CM | POA: Diagnosis not present

## 2017-08-21 DIAGNOSIS — E119 Type 2 diabetes mellitus without complications: Secondary | ICD-10-CM | POA: Diagnosis not present

## 2017-08-21 DIAGNOSIS — J449 Chronic obstructive pulmonary disease, unspecified: Secondary | ICD-10-CM | POA: Diagnosis not present

## 2017-08-21 DIAGNOSIS — G4733 Obstructive sleep apnea (adult) (pediatric): Secondary | ICD-10-CM | POA: Diagnosis not present

## 2017-08-21 DIAGNOSIS — Z8572 Personal history of non-Hodgkin lymphomas: Secondary | ICD-10-CM | POA: Diagnosis not present

## 2017-08-21 DIAGNOSIS — I509 Heart failure, unspecified: Secondary | ICD-10-CM | POA: Diagnosis not present

## 2017-08-21 DIAGNOSIS — L97222 Non-pressure chronic ulcer of left calf with fat layer exposed: Secondary | ICD-10-CM | POA: Diagnosis not present

## 2017-08-23 DIAGNOSIS — I872 Venous insufficiency (chronic) (peripheral): Secondary | ICD-10-CM | POA: Diagnosis not present

## 2017-08-23 DIAGNOSIS — L97222 Non-pressure chronic ulcer of left calf with fat layer exposed: Secondary | ICD-10-CM | POA: Diagnosis not present

## 2017-08-26 DIAGNOSIS — L97222 Non-pressure chronic ulcer of left calf with fat layer exposed: Secondary | ICD-10-CM | POA: Diagnosis not present

## 2017-08-26 DIAGNOSIS — I509 Heart failure, unspecified: Secondary | ICD-10-CM | POA: Diagnosis not present

## 2017-08-26 DIAGNOSIS — I872 Venous insufficiency (chronic) (peripheral): Secondary | ICD-10-CM | POA: Diagnosis not present

## 2017-08-26 DIAGNOSIS — G4733 Obstructive sleep apnea (adult) (pediatric): Secondary | ICD-10-CM | POA: Diagnosis not present

## 2017-08-26 DIAGNOSIS — G2581 Restless legs syndrome: Secondary | ICD-10-CM | POA: Diagnosis not present

## 2017-08-26 DIAGNOSIS — Z8572 Personal history of non-Hodgkin lymphomas: Secondary | ICD-10-CM | POA: Diagnosis not present

## 2017-08-26 DIAGNOSIS — J449 Chronic obstructive pulmonary disease, unspecified: Secondary | ICD-10-CM | POA: Diagnosis not present

## 2017-08-26 DIAGNOSIS — R69 Illness, unspecified: Secondary | ICD-10-CM | POA: Diagnosis not present

## 2017-08-26 DIAGNOSIS — E119 Type 2 diabetes mellitus without complications: Secondary | ICD-10-CM | POA: Diagnosis not present

## 2017-08-26 DIAGNOSIS — Z87440 Personal history of urinary (tract) infections: Secondary | ICD-10-CM | POA: Diagnosis not present

## 2017-08-28 DIAGNOSIS — J449 Chronic obstructive pulmonary disease, unspecified: Secondary | ICD-10-CM | POA: Diagnosis not present

## 2017-08-28 DIAGNOSIS — E119 Type 2 diabetes mellitus without complications: Secondary | ICD-10-CM | POA: Diagnosis not present

## 2017-08-28 DIAGNOSIS — I872 Venous insufficiency (chronic) (peripheral): Secondary | ICD-10-CM | POA: Diagnosis not present

## 2017-08-28 DIAGNOSIS — L97222 Non-pressure chronic ulcer of left calf with fat layer exposed: Secondary | ICD-10-CM | POA: Diagnosis not present

## 2017-08-28 DIAGNOSIS — G4733 Obstructive sleep apnea (adult) (pediatric): Secondary | ICD-10-CM | POA: Diagnosis not present

## 2017-08-28 DIAGNOSIS — I509 Heart failure, unspecified: Secondary | ICD-10-CM | POA: Diagnosis not present

## 2017-08-28 DIAGNOSIS — G2581 Restless legs syndrome: Secondary | ICD-10-CM | POA: Diagnosis not present

## 2017-08-28 DIAGNOSIS — Z87440 Personal history of urinary (tract) infections: Secondary | ICD-10-CM | POA: Diagnosis not present

## 2017-08-28 DIAGNOSIS — Z8572 Personal history of non-Hodgkin lymphomas: Secondary | ICD-10-CM | POA: Diagnosis not present

## 2017-08-28 DIAGNOSIS — R69 Illness, unspecified: Secondary | ICD-10-CM | POA: Diagnosis not present

## 2017-08-30 DIAGNOSIS — L97828 Non-pressure chronic ulcer of other part of left lower leg with other specified severity: Secondary | ICD-10-CM | POA: Diagnosis not present

## 2017-08-30 DIAGNOSIS — L97222 Non-pressure chronic ulcer of left calf with fat layer exposed: Secondary | ICD-10-CM | POA: Diagnosis not present

## 2017-08-30 DIAGNOSIS — I872 Venous insufficiency (chronic) (peripheral): Secondary | ICD-10-CM | POA: Diagnosis not present

## 2017-09-02 DIAGNOSIS — J449 Chronic obstructive pulmonary disease, unspecified: Secondary | ICD-10-CM | POA: Diagnosis not present

## 2017-09-02 DIAGNOSIS — I872 Venous insufficiency (chronic) (peripheral): Secondary | ICD-10-CM | POA: Diagnosis not present

## 2017-09-02 DIAGNOSIS — R69 Illness, unspecified: Secondary | ICD-10-CM | POA: Diagnosis not present

## 2017-09-02 DIAGNOSIS — E119 Type 2 diabetes mellitus without complications: Secondary | ICD-10-CM | POA: Diagnosis not present

## 2017-09-02 DIAGNOSIS — G4733 Obstructive sleep apnea (adult) (pediatric): Secondary | ICD-10-CM | POA: Diagnosis not present

## 2017-09-02 DIAGNOSIS — I509 Heart failure, unspecified: Secondary | ICD-10-CM | POA: Diagnosis not present

## 2017-09-02 DIAGNOSIS — G2581 Restless legs syndrome: Secondary | ICD-10-CM | POA: Diagnosis not present

## 2017-09-02 DIAGNOSIS — L97222 Non-pressure chronic ulcer of left calf with fat layer exposed: Secondary | ICD-10-CM | POA: Diagnosis not present

## 2017-09-02 DIAGNOSIS — Z87440 Personal history of urinary (tract) infections: Secondary | ICD-10-CM | POA: Diagnosis not present

## 2017-09-02 DIAGNOSIS — Z8572 Personal history of non-Hodgkin lymphomas: Secondary | ICD-10-CM | POA: Diagnosis not present

## 2017-09-04 DIAGNOSIS — Z8572 Personal history of non-Hodgkin lymphomas: Secondary | ICD-10-CM | POA: Diagnosis not present

## 2017-09-04 DIAGNOSIS — G4733 Obstructive sleep apnea (adult) (pediatric): Secondary | ICD-10-CM | POA: Diagnosis not present

## 2017-09-04 DIAGNOSIS — E119 Type 2 diabetes mellitus without complications: Secondary | ICD-10-CM | POA: Diagnosis not present

## 2017-09-04 DIAGNOSIS — J449 Chronic obstructive pulmonary disease, unspecified: Secondary | ICD-10-CM | POA: Diagnosis not present

## 2017-09-04 DIAGNOSIS — Z87440 Personal history of urinary (tract) infections: Secondary | ICD-10-CM | POA: Diagnosis not present

## 2017-09-04 DIAGNOSIS — I509 Heart failure, unspecified: Secondary | ICD-10-CM | POA: Diagnosis not present

## 2017-09-04 DIAGNOSIS — R69 Illness, unspecified: Secondary | ICD-10-CM | POA: Diagnosis not present

## 2017-09-04 DIAGNOSIS — G2581 Restless legs syndrome: Secondary | ICD-10-CM | POA: Diagnosis not present

## 2017-09-04 DIAGNOSIS — I872 Venous insufficiency (chronic) (peripheral): Secondary | ICD-10-CM | POA: Diagnosis not present

## 2017-09-04 DIAGNOSIS — L97222 Non-pressure chronic ulcer of left calf with fat layer exposed: Secondary | ICD-10-CM | POA: Diagnosis not present

## 2017-09-06 DIAGNOSIS — L97222 Non-pressure chronic ulcer of left calf with fat layer exposed: Secondary | ICD-10-CM | POA: Diagnosis not present

## 2017-09-06 DIAGNOSIS — Z87440 Personal history of urinary (tract) infections: Secondary | ICD-10-CM | POA: Diagnosis not present

## 2017-09-06 DIAGNOSIS — G2581 Restless legs syndrome: Secondary | ICD-10-CM | POA: Diagnosis not present

## 2017-09-06 DIAGNOSIS — Z8572 Personal history of non-Hodgkin lymphomas: Secondary | ICD-10-CM | POA: Diagnosis not present

## 2017-09-06 DIAGNOSIS — G4733 Obstructive sleep apnea (adult) (pediatric): Secondary | ICD-10-CM | POA: Diagnosis not present

## 2017-09-06 DIAGNOSIS — R69 Illness, unspecified: Secondary | ICD-10-CM | POA: Diagnosis not present

## 2017-09-06 DIAGNOSIS — J449 Chronic obstructive pulmonary disease, unspecified: Secondary | ICD-10-CM | POA: Diagnosis not present

## 2017-09-06 DIAGNOSIS — E119 Type 2 diabetes mellitus without complications: Secondary | ICD-10-CM | POA: Diagnosis not present

## 2017-09-06 DIAGNOSIS — I509 Heart failure, unspecified: Secondary | ICD-10-CM | POA: Diagnosis not present

## 2017-09-06 DIAGNOSIS — I872 Venous insufficiency (chronic) (peripheral): Secondary | ICD-10-CM | POA: Diagnosis not present

## 2017-09-09 DIAGNOSIS — L97222 Non-pressure chronic ulcer of left calf with fat layer exposed: Secondary | ICD-10-CM | POA: Diagnosis not present

## 2017-09-09 DIAGNOSIS — Z8572 Personal history of non-Hodgkin lymphomas: Secondary | ICD-10-CM | POA: Diagnosis not present

## 2017-09-09 DIAGNOSIS — E119 Type 2 diabetes mellitus without complications: Secondary | ICD-10-CM | POA: Diagnosis not present

## 2017-09-09 DIAGNOSIS — I509 Heart failure, unspecified: Secondary | ICD-10-CM | POA: Diagnosis not present

## 2017-09-09 DIAGNOSIS — I872 Venous insufficiency (chronic) (peripheral): Secondary | ICD-10-CM | POA: Diagnosis not present

## 2017-09-09 DIAGNOSIS — Z87440 Personal history of urinary (tract) infections: Secondary | ICD-10-CM | POA: Diagnosis not present

## 2017-09-09 DIAGNOSIS — J449 Chronic obstructive pulmonary disease, unspecified: Secondary | ICD-10-CM | POA: Diagnosis not present

## 2017-09-09 DIAGNOSIS — G4733 Obstructive sleep apnea (adult) (pediatric): Secondary | ICD-10-CM | POA: Diagnosis not present

## 2017-09-09 DIAGNOSIS — G2581 Restless legs syndrome: Secondary | ICD-10-CM | POA: Diagnosis not present

## 2017-09-09 DIAGNOSIS — R69 Illness, unspecified: Secondary | ICD-10-CM | POA: Diagnosis not present

## 2017-09-11 DIAGNOSIS — R69 Illness, unspecified: Secondary | ICD-10-CM | POA: Diagnosis not present

## 2017-09-11 DIAGNOSIS — I509 Heart failure, unspecified: Secondary | ICD-10-CM | POA: Diagnosis not present

## 2017-09-11 DIAGNOSIS — L97222 Non-pressure chronic ulcer of left calf with fat layer exposed: Secondary | ICD-10-CM | POA: Diagnosis not present

## 2017-09-11 DIAGNOSIS — Z8572 Personal history of non-Hodgkin lymphomas: Secondary | ICD-10-CM | POA: Diagnosis not present

## 2017-09-11 DIAGNOSIS — G2581 Restless legs syndrome: Secondary | ICD-10-CM | POA: Diagnosis not present

## 2017-09-11 DIAGNOSIS — Z87440 Personal history of urinary (tract) infections: Secondary | ICD-10-CM | POA: Diagnosis not present

## 2017-09-11 DIAGNOSIS — I872 Venous insufficiency (chronic) (peripheral): Secondary | ICD-10-CM | POA: Diagnosis not present

## 2017-09-11 DIAGNOSIS — G4733 Obstructive sleep apnea (adult) (pediatric): Secondary | ICD-10-CM | POA: Diagnosis not present

## 2017-09-11 DIAGNOSIS — J449 Chronic obstructive pulmonary disease, unspecified: Secondary | ICD-10-CM | POA: Diagnosis not present

## 2017-09-11 DIAGNOSIS — E119 Type 2 diabetes mellitus without complications: Secondary | ICD-10-CM | POA: Diagnosis not present

## 2017-09-13 DIAGNOSIS — G4733 Obstructive sleep apnea (adult) (pediatric): Secondary | ICD-10-CM | POA: Diagnosis not present

## 2017-09-13 DIAGNOSIS — G2581 Restless legs syndrome: Secondary | ICD-10-CM | POA: Diagnosis not present

## 2017-09-13 DIAGNOSIS — I872 Venous insufficiency (chronic) (peripheral): Secondary | ICD-10-CM | POA: Diagnosis not present

## 2017-09-13 DIAGNOSIS — E119 Type 2 diabetes mellitus without complications: Secondary | ICD-10-CM | POA: Diagnosis not present

## 2017-09-13 DIAGNOSIS — Z87440 Personal history of urinary (tract) infections: Secondary | ICD-10-CM | POA: Diagnosis not present

## 2017-09-13 DIAGNOSIS — I509 Heart failure, unspecified: Secondary | ICD-10-CM | POA: Diagnosis not present

## 2017-09-13 DIAGNOSIS — L97222 Non-pressure chronic ulcer of left calf with fat layer exposed: Secondary | ICD-10-CM | POA: Diagnosis not present

## 2017-09-13 DIAGNOSIS — R69 Illness, unspecified: Secondary | ICD-10-CM | POA: Diagnosis not present

## 2017-09-13 DIAGNOSIS — J449 Chronic obstructive pulmonary disease, unspecified: Secondary | ICD-10-CM | POA: Diagnosis not present

## 2017-09-13 DIAGNOSIS — Z8572 Personal history of non-Hodgkin lymphomas: Secondary | ICD-10-CM | POA: Diagnosis not present

## 2017-09-16 DIAGNOSIS — E119 Type 2 diabetes mellitus without complications: Secondary | ICD-10-CM | POA: Diagnosis not present

## 2017-09-16 DIAGNOSIS — I872 Venous insufficiency (chronic) (peripheral): Secondary | ICD-10-CM | POA: Diagnosis not present

## 2017-09-16 DIAGNOSIS — G2581 Restless legs syndrome: Secondary | ICD-10-CM | POA: Diagnosis not present

## 2017-09-16 DIAGNOSIS — R69 Illness, unspecified: Secondary | ICD-10-CM | POA: Diagnosis not present

## 2017-09-16 DIAGNOSIS — Z87440 Personal history of urinary (tract) infections: Secondary | ICD-10-CM | POA: Diagnosis not present

## 2017-09-16 DIAGNOSIS — G4733 Obstructive sleep apnea (adult) (pediatric): Secondary | ICD-10-CM | POA: Diagnosis not present

## 2017-09-16 DIAGNOSIS — J449 Chronic obstructive pulmonary disease, unspecified: Secondary | ICD-10-CM | POA: Diagnosis not present

## 2017-09-16 DIAGNOSIS — I509 Heart failure, unspecified: Secondary | ICD-10-CM | POA: Diagnosis not present

## 2017-09-16 DIAGNOSIS — L97222 Non-pressure chronic ulcer of left calf with fat layer exposed: Secondary | ICD-10-CM | POA: Diagnosis not present

## 2017-09-16 DIAGNOSIS — Z8572 Personal history of non-Hodgkin lymphomas: Secondary | ICD-10-CM | POA: Diagnosis not present

## 2017-09-17 DIAGNOSIS — G2581 Restless legs syndrome: Secondary | ICD-10-CM | POA: Diagnosis not present

## 2017-09-17 DIAGNOSIS — E119 Type 2 diabetes mellitus without complications: Secondary | ICD-10-CM | POA: Diagnosis not present

## 2017-09-17 DIAGNOSIS — L97222 Non-pressure chronic ulcer of left calf with fat layer exposed: Secondary | ICD-10-CM | POA: Diagnosis not present

## 2017-09-17 DIAGNOSIS — I509 Heart failure, unspecified: Secondary | ICD-10-CM | POA: Diagnosis not present

## 2017-09-17 DIAGNOSIS — I872 Venous insufficiency (chronic) (peripheral): Secondary | ICD-10-CM | POA: Diagnosis not present

## 2017-09-17 DIAGNOSIS — R69 Illness, unspecified: Secondary | ICD-10-CM | POA: Diagnosis not present

## 2017-09-17 DIAGNOSIS — G4733 Obstructive sleep apnea (adult) (pediatric): Secondary | ICD-10-CM | POA: Diagnosis not present

## 2017-09-17 DIAGNOSIS — Z87440 Personal history of urinary (tract) infections: Secondary | ICD-10-CM | POA: Diagnosis not present

## 2017-09-17 DIAGNOSIS — J449 Chronic obstructive pulmonary disease, unspecified: Secondary | ICD-10-CM | POA: Diagnosis not present

## 2017-09-17 DIAGNOSIS — Z8572 Personal history of non-Hodgkin lymphomas: Secondary | ICD-10-CM | POA: Diagnosis not present

## 2017-09-18 DIAGNOSIS — I509 Heart failure, unspecified: Secondary | ICD-10-CM | POA: Diagnosis not present

## 2017-09-18 DIAGNOSIS — E119 Type 2 diabetes mellitus without complications: Secondary | ICD-10-CM | POA: Diagnosis not present

## 2017-09-18 DIAGNOSIS — J449 Chronic obstructive pulmonary disease, unspecified: Secondary | ICD-10-CM | POA: Diagnosis not present

## 2017-09-18 DIAGNOSIS — G4733 Obstructive sleep apnea (adult) (pediatric): Secondary | ICD-10-CM | POA: Diagnosis not present

## 2017-09-18 DIAGNOSIS — I872 Venous insufficiency (chronic) (peripheral): Secondary | ICD-10-CM | POA: Diagnosis not present

## 2017-09-18 DIAGNOSIS — L97222 Non-pressure chronic ulcer of left calf with fat layer exposed: Secondary | ICD-10-CM | POA: Diagnosis not present

## 2017-09-18 DIAGNOSIS — Z8572 Personal history of non-Hodgkin lymphomas: Secondary | ICD-10-CM | POA: Diagnosis not present

## 2017-09-18 DIAGNOSIS — Z87440 Personal history of urinary (tract) infections: Secondary | ICD-10-CM | POA: Diagnosis not present

## 2017-09-18 DIAGNOSIS — R69 Illness, unspecified: Secondary | ICD-10-CM | POA: Diagnosis not present

## 2017-09-18 DIAGNOSIS — G2581 Restless legs syndrome: Secondary | ICD-10-CM | POA: Diagnosis not present

## 2017-09-23 DIAGNOSIS — L97222 Non-pressure chronic ulcer of left calf with fat layer exposed: Secondary | ICD-10-CM | POA: Diagnosis not present

## 2017-09-23 DIAGNOSIS — Z87440 Personal history of urinary (tract) infections: Secondary | ICD-10-CM | POA: Diagnosis not present

## 2017-09-23 DIAGNOSIS — Z8572 Personal history of non-Hodgkin lymphomas: Secondary | ICD-10-CM | POA: Diagnosis not present

## 2017-09-23 DIAGNOSIS — J449 Chronic obstructive pulmonary disease, unspecified: Secondary | ICD-10-CM | POA: Diagnosis not present

## 2017-09-23 DIAGNOSIS — I509 Heart failure, unspecified: Secondary | ICD-10-CM | POA: Diagnosis not present

## 2017-09-23 DIAGNOSIS — E119 Type 2 diabetes mellitus without complications: Secondary | ICD-10-CM | POA: Diagnosis not present

## 2017-09-23 DIAGNOSIS — G2581 Restless legs syndrome: Secondary | ICD-10-CM | POA: Diagnosis not present

## 2017-09-23 DIAGNOSIS — G4733 Obstructive sleep apnea (adult) (pediatric): Secondary | ICD-10-CM | POA: Diagnosis not present

## 2017-09-23 DIAGNOSIS — I872 Venous insufficiency (chronic) (peripheral): Secondary | ICD-10-CM | POA: Diagnosis not present

## 2017-09-23 DIAGNOSIS — R69 Illness, unspecified: Secondary | ICD-10-CM | POA: Diagnosis not present

## 2017-09-25 DIAGNOSIS — G4733 Obstructive sleep apnea (adult) (pediatric): Secondary | ICD-10-CM | POA: Diagnosis not present

## 2017-09-25 DIAGNOSIS — L97222 Non-pressure chronic ulcer of left calf with fat layer exposed: Secondary | ICD-10-CM | POA: Diagnosis not present

## 2017-09-25 DIAGNOSIS — I872 Venous insufficiency (chronic) (peripheral): Secondary | ICD-10-CM | POA: Diagnosis not present

## 2017-09-25 DIAGNOSIS — R69 Illness, unspecified: Secondary | ICD-10-CM | POA: Diagnosis not present

## 2017-09-25 DIAGNOSIS — Z87440 Personal history of urinary (tract) infections: Secondary | ICD-10-CM | POA: Diagnosis not present

## 2017-09-25 DIAGNOSIS — Z8572 Personal history of non-Hodgkin lymphomas: Secondary | ICD-10-CM | POA: Diagnosis not present

## 2017-09-25 DIAGNOSIS — E119 Type 2 diabetes mellitus without complications: Secondary | ICD-10-CM | POA: Diagnosis not present

## 2017-09-25 DIAGNOSIS — J449 Chronic obstructive pulmonary disease, unspecified: Secondary | ICD-10-CM | POA: Diagnosis not present

## 2017-09-25 DIAGNOSIS — I509 Heart failure, unspecified: Secondary | ICD-10-CM | POA: Diagnosis not present

## 2017-09-25 DIAGNOSIS — G2581 Restless legs syndrome: Secondary | ICD-10-CM | POA: Diagnosis not present

## 2017-09-27 DIAGNOSIS — Z87440 Personal history of urinary (tract) infections: Secondary | ICD-10-CM | POA: Diagnosis not present

## 2017-09-27 DIAGNOSIS — G4733 Obstructive sleep apnea (adult) (pediatric): Secondary | ICD-10-CM | POA: Diagnosis not present

## 2017-09-27 DIAGNOSIS — R69 Illness, unspecified: Secondary | ICD-10-CM | POA: Diagnosis not present

## 2017-09-27 DIAGNOSIS — I872 Venous insufficiency (chronic) (peripheral): Secondary | ICD-10-CM | POA: Diagnosis not present

## 2017-09-27 DIAGNOSIS — J449 Chronic obstructive pulmonary disease, unspecified: Secondary | ICD-10-CM | POA: Diagnosis not present

## 2017-09-27 DIAGNOSIS — L97222 Non-pressure chronic ulcer of left calf with fat layer exposed: Secondary | ICD-10-CM | POA: Diagnosis not present

## 2017-09-27 DIAGNOSIS — I509 Heart failure, unspecified: Secondary | ICD-10-CM | POA: Diagnosis not present

## 2017-09-27 DIAGNOSIS — G2581 Restless legs syndrome: Secondary | ICD-10-CM | POA: Diagnosis not present

## 2017-09-27 DIAGNOSIS — E119 Type 2 diabetes mellitus without complications: Secondary | ICD-10-CM | POA: Diagnosis not present

## 2017-09-27 DIAGNOSIS — Z8572 Personal history of non-Hodgkin lymphomas: Secondary | ICD-10-CM | POA: Diagnosis not present

## 2017-09-30 DIAGNOSIS — Z87440 Personal history of urinary (tract) infections: Secondary | ICD-10-CM | POA: Diagnosis not present

## 2017-09-30 DIAGNOSIS — Z8572 Personal history of non-Hodgkin lymphomas: Secondary | ICD-10-CM | POA: Diagnosis not present

## 2017-09-30 DIAGNOSIS — R69 Illness, unspecified: Secondary | ICD-10-CM | POA: Diagnosis not present

## 2017-09-30 DIAGNOSIS — E119 Type 2 diabetes mellitus without complications: Secondary | ICD-10-CM | POA: Diagnosis not present

## 2017-09-30 DIAGNOSIS — I872 Venous insufficiency (chronic) (peripheral): Secondary | ICD-10-CM | POA: Diagnosis not present

## 2017-09-30 DIAGNOSIS — G2581 Restless legs syndrome: Secondary | ICD-10-CM | POA: Diagnosis not present

## 2017-09-30 DIAGNOSIS — L97222 Non-pressure chronic ulcer of left calf with fat layer exposed: Secondary | ICD-10-CM | POA: Diagnosis not present

## 2017-09-30 DIAGNOSIS — I509 Heart failure, unspecified: Secondary | ICD-10-CM | POA: Diagnosis not present

## 2017-09-30 DIAGNOSIS — J449 Chronic obstructive pulmonary disease, unspecified: Secondary | ICD-10-CM | POA: Diagnosis not present

## 2017-09-30 DIAGNOSIS — G4733 Obstructive sleep apnea (adult) (pediatric): Secondary | ICD-10-CM | POA: Diagnosis not present

## 2017-10-02 DIAGNOSIS — I509 Heart failure, unspecified: Secondary | ICD-10-CM | POA: Diagnosis not present

## 2017-10-02 DIAGNOSIS — E119 Type 2 diabetes mellitus without complications: Secondary | ICD-10-CM | POA: Diagnosis not present

## 2017-10-02 DIAGNOSIS — Z8572 Personal history of non-Hodgkin lymphomas: Secondary | ICD-10-CM | POA: Diagnosis not present

## 2017-10-02 DIAGNOSIS — I872 Venous insufficiency (chronic) (peripheral): Secondary | ICD-10-CM | POA: Diagnosis not present

## 2017-10-02 DIAGNOSIS — L97222 Non-pressure chronic ulcer of left calf with fat layer exposed: Secondary | ICD-10-CM | POA: Diagnosis not present

## 2017-10-02 DIAGNOSIS — G4733 Obstructive sleep apnea (adult) (pediatric): Secondary | ICD-10-CM | POA: Diagnosis not present

## 2017-10-02 DIAGNOSIS — J449 Chronic obstructive pulmonary disease, unspecified: Secondary | ICD-10-CM | POA: Diagnosis not present

## 2017-10-02 DIAGNOSIS — R69 Illness, unspecified: Secondary | ICD-10-CM | POA: Diagnosis not present

## 2017-10-02 DIAGNOSIS — Z87440 Personal history of urinary (tract) infections: Secondary | ICD-10-CM | POA: Diagnosis not present

## 2017-10-02 DIAGNOSIS — G2581 Restless legs syndrome: Secondary | ICD-10-CM | POA: Diagnosis not present

## 2017-10-03 DIAGNOSIS — R69 Illness, unspecified: Secondary | ICD-10-CM | POA: Diagnosis not present

## 2017-10-04 DIAGNOSIS — L97222 Non-pressure chronic ulcer of left calf with fat layer exposed: Secondary | ICD-10-CM | POA: Diagnosis not present

## 2017-10-04 DIAGNOSIS — J449 Chronic obstructive pulmonary disease, unspecified: Secondary | ICD-10-CM | POA: Diagnosis not present

## 2017-10-04 DIAGNOSIS — G4733 Obstructive sleep apnea (adult) (pediatric): Secondary | ICD-10-CM | POA: Diagnosis not present

## 2017-10-04 DIAGNOSIS — Z87440 Personal history of urinary (tract) infections: Secondary | ICD-10-CM | POA: Diagnosis not present

## 2017-10-04 DIAGNOSIS — I872 Venous insufficiency (chronic) (peripheral): Secondary | ICD-10-CM | POA: Diagnosis not present

## 2017-10-04 DIAGNOSIS — G2581 Restless legs syndrome: Secondary | ICD-10-CM | POA: Diagnosis not present

## 2017-10-04 DIAGNOSIS — E119 Type 2 diabetes mellitus without complications: Secondary | ICD-10-CM | POA: Diagnosis not present

## 2017-10-04 DIAGNOSIS — R69 Illness, unspecified: Secondary | ICD-10-CM | POA: Diagnosis not present

## 2017-10-04 DIAGNOSIS — Z8572 Personal history of non-Hodgkin lymphomas: Secondary | ICD-10-CM | POA: Diagnosis not present

## 2017-10-04 DIAGNOSIS — I509 Heart failure, unspecified: Secondary | ICD-10-CM | POA: Diagnosis not present

## 2017-10-07 DIAGNOSIS — I872 Venous insufficiency (chronic) (peripheral): Secondary | ICD-10-CM | POA: Diagnosis not present

## 2017-10-07 DIAGNOSIS — R69 Illness, unspecified: Secondary | ICD-10-CM | POA: Diagnosis not present

## 2017-10-07 DIAGNOSIS — Z87440 Personal history of urinary (tract) infections: Secondary | ICD-10-CM | POA: Diagnosis not present

## 2017-10-07 DIAGNOSIS — E119 Type 2 diabetes mellitus without complications: Secondary | ICD-10-CM | POA: Diagnosis not present

## 2017-10-07 DIAGNOSIS — G2581 Restless legs syndrome: Secondary | ICD-10-CM | POA: Diagnosis not present

## 2017-10-07 DIAGNOSIS — I509 Heart failure, unspecified: Secondary | ICD-10-CM | POA: Diagnosis not present

## 2017-10-07 DIAGNOSIS — Z8572 Personal history of non-Hodgkin lymphomas: Secondary | ICD-10-CM | POA: Diagnosis not present

## 2017-10-07 DIAGNOSIS — G4733 Obstructive sleep apnea (adult) (pediatric): Secondary | ICD-10-CM | POA: Diagnosis not present

## 2017-10-07 DIAGNOSIS — J449 Chronic obstructive pulmonary disease, unspecified: Secondary | ICD-10-CM | POA: Diagnosis not present

## 2017-10-07 DIAGNOSIS — L97222 Non-pressure chronic ulcer of left calf with fat layer exposed: Secondary | ICD-10-CM | POA: Diagnosis not present

## 2017-10-09 DIAGNOSIS — I872 Venous insufficiency (chronic) (peripheral): Secondary | ICD-10-CM | POA: Diagnosis not present

## 2017-10-09 DIAGNOSIS — L97828 Non-pressure chronic ulcer of other part of left lower leg with other specified severity: Secondary | ICD-10-CM | POA: Diagnosis not present

## 2017-10-09 DIAGNOSIS — L97922 Non-pressure chronic ulcer of unspecified part of left lower leg with fat layer exposed: Secondary | ICD-10-CM | POA: Diagnosis not present

## 2017-10-11 DIAGNOSIS — I509 Heart failure, unspecified: Secondary | ICD-10-CM | POA: Diagnosis not present

## 2017-10-11 DIAGNOSIS — L97222 Non-pressure chronic ulcer of left calf with fat layer exposed: Secondary | ICD-10-CM | POA: Diagnosis not present

## 2017-10-11 DIAGNOSIS — Z8572 Personal history of non-Hodgkin lymphomas: Secondary | ICD-10-CM | POA: Diagnosis not present

## 2017-10-11 DIAGNOSIS — R69 Illness, unspecified: Secondary | ICD-10-CM | POA: Diagnosis not present

## 2017-10-11 DIAGNOSIS — G4733 Obstructive sleep apnea (adult) (pediatric): Secondary | ICD-10-CM | POA: Diagnosis not present

## 2017-10-11 DIAGNOSIS — G2581 Restless legs syndrome: Secondary | ICD-10-CM | POA: Diagnosis not present

## 2017-10-11 DIAGNOSIS — E119 Type 2 diabetes mellitus without complications: Secondary | ICD-10-CM | POA: Diagnosis not present

## 2017-10-11 DIAGNOSIS — I872 Venous insufficiency (chronic) (peripheral): Secondary | ICD-10-CM | POA: Diagnosis not present

## 2017-10-11 DIAGNOSIS — J449 Chronic obstructive pulmonary disease, unspecified: Secondary | ICD-10-CM | POA: Diagnosis not present

## 2017-10-11 DIAGNOSIS — Z87440 Personal history of urinary (tract) infections: Secondary | ICD-10-CM | POA: Diagnosis not present

## 2017-10-14 DIAGNOSIS — R69 Illness, unspecified: Secondary | ICD-10-CM | POA: Diagnosis not present

## 2017-10-14 DIAGNOSIS — G2581 Restless legs syndrome: Secondary | ICD-10-CM | POA: Diagnosis not present

## 2017-10-14 DIAGNOSIS — L97222 Non-pressure chronic ulcer of left calf with fat layer exposed: Secondary | ICD-10-CM | POA: Diagnosis not present

## 2017-10-14 DIAGNOSIS — G4733 Obstructive sleep apnea (adult) (pediatric): Secondary | ICD-10-CM | POA: Diagnosis not present

## 2017-10-14 DIAGNOSIS — Z87440 Personal history of urinary (tract) infections: Secondary | ICD-10-CM | POA: Diagnosis not present

## 2017-10-14 DIAGNOSIS — I872 Venous insufficiency (chronic) (peripheral): Secondary | ICD-10-CM | POA: Diagnosis not present

## 2017-10-14 DIAGNOSIS — E119 Type 2 diabetes mellitus without complications: Secondary | ICD-10-CM | POA: Diagnosis not present

## 2017-10-14 DIAGNOSIS — I509 Heart failure, unspecified: Secondary | ICD-10-CM | POA: Diagnosis not present

## 2017-10-14 DIAGNOSIS — J449 Chronic obstructive pulmonary disease, unspecified: Secondary | ICD-10-CM | POA: Diagnosis not present

## 2017-10-14 DIAGNOSIS — Z8572 Personal history of non-Hodgkin lymphomas: Secondary | ICD-10-CM | POA: Diagnosis not present

## 2017-10-16 DIAGNOSIS — L97818 Non-pressure chronic ulcer of other part of right lower leg with other specified severity: Secondary | ICD-10-CM | POA: Diagnosis not present

## 2017-10-16 DIAGNOSIS — I872 Venous insufficiency (chronic) (peripheral): Secondary | ICD-10-CM | POA: Diagnosis not present

## 2017-10-18 DIAGNOSIS — Z87440 Personal history of urinary (tract) infections: Secondary | ICD-10-CM | POA: Diagnosis not present

## 2017-10-18 DIAGNOSIS — E119 Type 2 diabetes mellitus without complications: Secondary | ICD-10-CM | POA: Diagnosis not present

## 2017-10-18 DIAGNOSIS — I509 Heart failure, unspecified: Secondary | ICD-10-CM | POA: Diagnosis not present

## 2017-10-18 DIAGNOSIS — J449 Chronic obstructive pulmonary disease, unspecified: Secondary | ICD-10-CM | POA: Diagnosis not present

## 2017-10-18 DIAGNOSIS — G2581 Restless legs syndrome: Secondary | ICD-10-CM | POA: Diagnosis not present

## 2017-10-18 DIAGNOSIS — L97222 Non-pressure chronic ulcer of left calf with fat layer exposed: Secondary | ICD-10-CM | POA: Diagnosis not present

## 2017-10-18 DIAGNOSIS — I872 Venous insufficiency (chronic) (peripheral): Secondary | ICD-10-CM | POA: Diagnosis not present

## 2017-10-18 DIAGNOSIS — R69 Illness, unspecified: Secondary | ICD-10-CM | POA: Diagnosis not present

## 2017-10-18 DIAGNOSIS — Z8572 Personal history of non-Hodgkin lymphomas: Secondary | ICD-10-CM | POA: Diagnosis not present

## 2017-10-18 DIAGNOSIS — G4733 Obstructive sleep apnea (adult) (pediatric): Secondary | ICD-10-CM | POA: Diagnosis not present

## 2017-10-21 DIAGNOSIS — Z8572 Personal history of non-Hodgkin lymphomas: Secondary | ICD-10-CM | POA: Diagnosis not present

## 2017-10-21 DIAGNOSIS — I872 Venous insufficiency (chronic) (peripheral): Secondary | ICD-10-CM | POA: Diagnosis not present

## 2017-10-21 DIAGNOSIS — G4733 Obstructive sleep apnea (adult) (pediatric): Secondary | ICD-10-CM | POA: Diagnosis not present

## 2017-10-21 DIAGNOSIS — L97222 Non-pressure chronic ulcer of left calf with fat layer exposed: Secondary | ICD-10-CM | POA: Diagnosis not present

## 2017-10-21 DIAGNOSIS — R69 Illness, unspecified: Secondary | ICD-10-CM | POA: Diagnosis not present

## 2017-10-21 DIAGNOSIS — Z87440 Personal history of urinary (tract) infections: Secondary | ICD-10-CM | POA: Diagnosis not present

## 2017-10-21 DIAGNOSIS — I509 Heart failure, unspecified: Secondary | ICD-10-CM | POA: Diagnosis not present

## 2017-10-21 DIAGNOSIS — G2581 Restless legs syndrome: Secondary | ICD-10-CM | POA: Diagnosis not present

## 2017-10-21 DIAGNOSIS — E119 Type 2 diabetes mellitus without complications: Secondary | ICD-10-CM | POA: Diagnosis not present

## 2017-10-21 DIAGNOSIS — J449 Chronic obstructive pulmonary disease, unspecified: Secondary | ICD-10-CM | POA: Diagnosis not present

## 2017-10-23 DIAGNOSIS — I509 Heart failure, unspecified: Secondary | ICD-10-CM | POA: Diagnosis not present

## 2017-10-23 DIAGNOSIS — E119 Type 2 diabetes mellitus without complications: Secondary | ICD-10-CM | POA: Diagnosis not present

## 2017-10-23 DIAGNOSIS — J449 Chronic obstructive pulmonary disease, unspecified: Secondary | ICD-10-CM | POA: Diagnosis not present

## 2017-10-23 DIAGNOSIS — L97222 Non-pressure chronic ulcer of left calf with fat layer exposed: Secondary | ICD-10-CM | POA: Diagnosis not present

## 2017-10-23 DIAGNOSIS — G4733 Obstructive sleep apnea (adult) (pediatric): Secondary | ICD-10-CM | POA: Diagnosis not present

## 2017-10-23 DIAGNOSIS — G2581 Restless legs syndrome: Secondary | ICD-10-CM | POA: Diagnosis not present

## 2017-10-23 DIAGNOSIS — Z87440 Personal history of urinary (tract) infections: Secondary | ICD-10-CM | POA: Diagnosis not present

## 2017-10-23 DIAGNOSIS — Z8572 Personal history of non-Hodgkin lymphomas: Secondary | ICD-10-CM | POA: Diagnosis not present

## 2017-10-23 DIAGNOSIS — R69 Illness, unspecified: Secondary | ICD-10-CM | POA: Diagnosis not present

## 2017-10-23 DIAGNOSIS — I872 Venous insufficiency (chronic) (peripheral): Secondary | ICD-10-CM | POA: Diagnosis not present

## 2017-10-28 DIAGNOSIS — L97222 Non-pressure chronic ulcer of left calf with fat layer exposed: Secondary | ICD-10-CM | POA: Diagnosis not present

## 2017-10-28 DIAGNOSIS — R69 Illness, unspecified: Secondary | ICD-10-CM | POA: Diagnosis not present

## 2017-10-28 DIAGNOSIS — I509 Heart failure, unspecified: Secondary | ICD-10-CM | POA: Diagnosis not present

## 2017-10-28 DIAGNOSIS — G2581 Restless legs syndrome: Secondary | ICD-10-CM | POA: Diagnosis not present

## 2017-10-28 DIAGNOSIS — I872 Venous insufficiency (chronic) (peripheral): Secondary | ICD-10-CM | POA: Diagnosis not present

## 2017-10-28 DIAGNOSIS — G4733 Obstructive sleep apnea (adult) (pediatric): Secondary | ICD-10-CM | POA: Diagnosis not present

## 2017-10-28 DIAGNOSIS — E119 Type 2 diabetes mellitus without complications: Secondary | ICD-10-CM | POA: Diagnosis not present

## 2017-10-28 DIAGNOSIS — Z8572 Personal history of non-Hodgkin lymphomas: Secondary | ICD-10-CM | POA: Diagnosis not present

## 2017-10-28 DIAGNOSIS — J449 Chronic obstructive pulmonary disease, unspecified: Secondary | ICD-10-CM | POA: Diagnosis not present

## 2017-10-28 DIAGNOSIS — Z87440 Personal history of urinary (tract) infections: Secondary | ICD-10-CM | POA: Diagnosis not present

## 2017-10-30 DIAGNOSIS — L97828 Non-pressure chronic ulcer of other part of left lower leg with other specified severity: Secondary | ICD-10-CM | POA: Diagnosis not present

## 2017-10-30 DIAGNOSIS — I872 Venous insufficiency (chronic) (peripheral): Secondary | ICD-10-CM | POA: Diagnosis not present

## 2017-10-30 DIAGNOSIS — L97818 Non-pressure chronic ulcer of other part of right lower leg with other specified severity: Secondary | ICD-10-CM | POA: Diagnosis not present

## 2017-10-30 DIAGNOSIS — L97922 Non-pressure chronic ulcer of unspecified part of left lower leg with fat layer exposed: Secondary | ICD-10-CM | POA: Diagnosis not present

## 2017-11-01 DIAGNOSIS — R69 Illness, unspecified: Secondary | ICD-10-CM | POA: Diagnosis not present

## 2017-11-01 DIAGNOSIS — G2581 Restless legs syndrome: Secondary | ICD-10-CM | POA: Diagnosis not present

## 2017-11-01 DIAGNOSIS — I872 Venous insufficiency (chronic) (peripheral): Secondary | ICD-10-CM | POA: Diagnosis not present

## 2017-11-01 DIAGNOSIS — E119 Type 2 diabetes mellitus without complications: Secondary | ICD-10-CM | POA: Diagnosis not present

## 2017-11-01 DIAGNOSIS — J449 Chronic obstructive pulmonary disease, unspecified: Secondary | ICD-10-CM | POA: Diagnosis not present

## 2017-11-01 DIAGNOSIS — Z8572 Personal history of non-Hodgkin lymphomas: Secondary | ICD-10-CM | POA: Diagnosis not present

## 2017-11-01 DIAGNOSIS — Z87440 Personal history of urinary (tract) infections: Secondary | ICD-10-CM | POA: Diagnosis not present

## 2017-11-01 DIAGNOSIS — I509 Heart failure, unspecified: Secondary | ICD-10-CM | POA: Diagnosis not present

## 2017-11-01 DIAGNOSIS — G4733 Obstructive sleep apnea (adult) (pediatric): Secondary | ICD-10-CM | POA: Diagnosis not present

## 2017-11-01 DIAGNOSIS — L97222 Non-pressure chronic ulcer of left calf with fat layer exposed: Secondary | ICD-10-CM | POA: Diagnosis not present

## 2017-11-04 DIAGNOSIS — J449 Chronic obstructive pulmonary disease, unspecified: Secondary | ICD-10-CM | POA: Diagnosis not present

## 2017-11-04 DIAGNOSIS — Z8572 Personal history of non-Hodgkin lymphomas: Secondary | ICD-10-CM | POA: Diagnosis not present

## 2017-11-04 DIAGNOSIS — E119 Type 2 diabetes mellitus without complications: Secondary | ICD-10-CM | POA: Diagnosis not present

## 2017-11-04 DIAGNOSIS — G4733 Obstructive sleep apnea (adult) (pediatric): Secondary | ICD-10-CM | POA: Diagnosis not present

## 2017-11-04 DIAGNOSIS — R69 Illness, unspecified: Secondary | ICD-10-CM | POA: Diagnosis not present

## 2017-11-04 DIAGNOSIS — G2581 Restless legs syndrome: Secondary | ICD-10-CM | POA: Diagnosis not present

## 2017-11-04 DIAGNOSIS — Z87440 Personal history of urinary (tract) infections: Secondary | ICD-10-CM | POA: Diagnosis not present

## 2017-11-04 DIAGNOSIS — I509 Heart failure, unspecified: Secondary | ICD-10-CM | POA: Diagnosis not present

## 2017-11-04 DIAGNOSIS — I872 Venous insufficiency (chronic) (peripheral): Secondary | ICD-10-CM | POA: Diagnosis not present

## 2017-11-04 DIAGNOSIS — L97222 Non-pressure chronic ulcer of left calf with fat layer exposed: Secondary | ICD-10-CM | POA: Diagnosis not present

## 2017-11-05 DIAGNOSIS — D649 Anemia, unspecified: Secondary | ICD-10-CM | POA: Diagnosis not present

## 2017-11-05 DIAGNOSIS — E781 Pure hyperglyceridemia: Secondary | ICD-10-CM | POA: Diagnosis not present

## 2017-11-05 DIAGNOSIS — I10 Essential (primary) hypertension: Secondary | ICD-10-CM | POA: Diagnosis not present

## 2017-11-05 DIAGNOSIS — E039 Hypothyroidism, unspecified: Secondary | ICD-10-CM | POA: Diagnosis not present

## 2017-11-05 DIAGNOSIS — E1165 Type 2 diabetes mellitus with hyperglycemia: Secondary | ICD-10-CM | POA: Diagnosis not present

## 2017-11-05 DIAGNOSIS — E782 Mixed hyperlipidemia: Secondary | ICD-10-CM | POA: Diagnosis not present

## 2017-11-06 DIAGNOSIS — I872 Venous insufficiency (chronic) (peripheral): Secondary | ICD-10-CM | POA: Diagnosis not present

## 2017-11-06 DIAGNOSIS — L97828 Non-pressure chronic ulcer of other part of left lower leg with other specified severity: Secondary | ICD-10-CM | POA: Diagnosis not present

## 2017-11-08 DIAGNOSIS — I509 Heart failure, unspecified: Secondary | ICD-10-CM | POA: Diagnosis not present

## 2017-11-08 DIAGNOSIS — I872 Venous insufficiency (chronic) (peripheral): Secondary | ICD-10-CM | POA: Diagnosis not present

## 2017-11-08 DIAGNOSIS — L97222 Non-pressure chronic ulcer of left calf with fat layer exposed: Secondary | ICD-10-CM | POA: Diagnosis not present

## 2017-11-08 DIAGNOSIS — E119 Type 2 diabetes mellitus without complications: Secondary | ICD-10-CM | POA: Diagnosis not present

## 2017-11-08 DIAGNOSIS — J449 Chronic obstructive pulmonary disease, unspecified: Secondary | ICD-10-CM | POA: Diagnosis not present

## 2017-11-08 DIAGNOSIS — Z8572 Personal history of non-Hodgkin lymphomas: Secondary | ICD-10-CM | POA: Diagnosis not present

## 2017-11-08 DIAGNOSIS — G4733 Obstructive sleep apnea (adult) (pediatric): Secondary | ICD-10-CM | POA: Diagnosis not present

## 2017-11-08 DIAGNOSIS — R69 Illness, unspecified: Secondary | ICD-10-CM | POA: Diagnosis not present

## 2017-11-08 DIAGNOSIS — Z87440 Personal history of urinary (tract) infections: Secondary | ICD-10-CM | POA: Diagnosis not present

## 2017-11-08 DIAGNOSIS — G2581 Restless legs syndrome: Secondary | ICD-10-CM | POA: Diagnosis not present

## 2017-11-11 DIAGNOSIS — G2581 Restless legs syndrome: Secondary | ICD-10-CM | POA: Diagnosis not present

## 2017-11-11 DIAGNOSIS — I872 Venous insufficiency (chronic) (peripheral): Secondary | ICD-10-CM | POA: Diagnosis not present

## 2017-11-11 DIAGNOSIS — G4733 Obstructive sleep apnea (adult) (pediatric): Secondary | ICD-10-CM | POA: Diagnosis not present

## 2017-11-11 DIAGNOSIS — Z87440 Personal history of urinary (tract) infections: Secondary | ICD-10-CM | POA: Diagnosis not present

## 2017-11-11 DIAGNOSIS — E119 Type 2 diabetes mellitus without complications: Secondary | ICD-10-CM | POA: Diagnosis not present

## 2017-11-11 DIAGNOSIS — I509 Heart failure, unspecified: Secondary | ICD-10-CM | POA: Diagnosis not present

## 2017-11-11 DIAGNOSIS — J449 Chronic obstructive pulmonary disease, unspecified: Secondary | ICD-10-CM | POA: Diagnosis not present

## 2017-11-11 DIAGNOSIS — Z8572 Personal history of non-Hodgkin lymphomas: Secondary | ICD-10-CM | POA: Diagnosis not present

## 2017-11-11 DIAGNOSIS — L97222 Non-pressure chronic ulcer of left calf with fat layer exposed: Secondary | ICD-10-CM | POA: Diagnosis not present

## 2017-11-11 DIAGNOSIS — R69 Illness, unspecified: Secondary | ICD-10-CM | POA: Diagnosis not present

## 2017-11-12 DIAGNOSIS — I10 Essential (primary) hypertension: Secondary | ICD-10-CM | POA: Diagnosis not present

## 2017-11-12 DIAGNOSIS — R600 Localized edema: Secondary | ICD-10-CM | POA: Diagnosis not present

## 2017-11-12 DIAGNOSIS — Z6841 Body Mass Index (BMI) 40.0 and over, adult: Secondary | ICD-10-CM | POA: Diagnosis not present

## 2017-11-12 DIAGNOSIS — E039 Hypothyroidism, unspecified: Secondary | ICD-10-CM | POA: Diagnosis not present

## 2017-11-12 DIAGNOSIS — R252 Cramp and spasm: Secondary | ICD-10-CM | POA: Diagnosis not present

## 2017-11-12 DIAGNOSIS — Z23 Encounter for immunization: Secondary | ICD-10-CM | POA: Diagnosis not present

## 2017-11-12 DIAGNOSIS — E1165 Type 2 diabetes mellitus with hyperglycemia: Secondary | ICD-10-CM | POA: Diagnosis not present

## 2017-11-13 DIAGNOSIS — J449 Chronic obstructive pulmonary disease, unspecified: Secondary | ICD-10-CM | POA: Diagnosis not present

## 2017-11-13 DIAGNOSIS — Z8572 Personal history of non-Hodgkin lymphomas: Secondary | ICD-10-CM | POA: Diagnosis not present

## 2017-11-13 DIAGNOSIS — G2581 Restless legs syndrome: Secondary | ICD-10-CM | POA: Diagnosis not present

## 2017-11-13 DIAGNOSIS — E119 Type 2 diabetes mellitus without complications: Secondary | ICD-10-CM | POA: Diagnosis not present

## 2017-11-13 DIAGNOSIS — Z87440 Personal history of urinary (tract) infections: Secondary | ICD-10-CM | POA: Diagnosis not present

## 2017-11-13 DIAGNOSIS — R69 Illness, unspecified: Secondary | ICD-10-CM | POA: Diagnosis not present

## 2017-11-13 DIAGNOSIS — L97222 Non-pressure chronic ulcer of left calf with fat layer exposed: Secondary | ICD-10-CM | POA: Diagnosis not present

## 2017-11-13 DIAGNOSIS — G4733 Obstructive sleep apnea (adult) (pediatric): Secondary | ICD-10-CM | POA: Diagnosis not present

## 2017-11-13 DIAGNOSIS — I872 Venous insufficiency (chronic) (peripheral): Secondary | ICD-10-CM | POA: Diagnosis not present

## 2017-11-13 DIAGNOSIS — I509 Heart failure, unspecified: Secondary | ICD-10-CM | POA: Diagnosis not present

## 2017-11-15 DIAGNOSIS — R69 Illness, unspecified: Secondary | ICD-10-CM | POA: Diagnosis not present

## 2017-11-15 DIAGNOSIS — I872 Venous insufficiency (chronic) (peripheral): Secondary | ICD-10-CM | POA: Diagnosis not present

## 2017-11-15 DIAGNOSIS — E119 Type 2 diabetes mellitus without complications: Secondary | ICD-10-CM | POA: Diagnosis not present

## 2017-11-15 DIAGNOSIS — G4733 Obstructive sleep apnea (adult) (pediatric): Secondary | ICD-10-CM | POA: Diagnosis not present

## 2017-11-15 DIAGNOSIS — I509 Heart failure, unspecified: Secondary | ICD-10-CM | POA: Diagnosis not present

## 2017-11-15 DIAGNOSIS — G2581 Restless legs syndrome: Secondary | ICD-10-CM | POA: Diagnosis not present

## 2017-11-15 DIAGNOSIS — J449 Chronic obstructive pulmonary disease, unspecified: Secondary | ICD-10-CM | POA: Diagnosis not present

## 2017-11-15 DIAGNOSIS — L97222 Non-pressure chronic ulcer of left calf with fat layer exposed: Secondary | ICD-10-CM | POA: Diagnosis not present

## 2017-11-15 DIAGNOSIS — Z87440 Personal history of urinary (tract) infections: Secondary | ICD-10-CM | POA: Diagnosis not present

## 2017-11-15 DIAGNOSIS — Z8572 Personal history of non-Hodgkin lymphomas: Secondary | ICD-10-CM | POA: Diagnosis not present

## 2017-11-16 DIAGNOSIS — Z8572 Personal history of non-Hodgkin lymphomas: Secondary | ICD-10-CM | POA: Diagnosis not present

## 2017-11-16 DIAGNOSIS — I872 Venous insufficiency (chronic) (peripheral): Secondary | ICD-10-CM | POA: Diagnosis not present

## 2017-11-16 DIAGNOSIS — Z87440 Personal history of urinary (tract) infections: Secondary | ICD-10-CM | POA: Diagnosis not present

## 2017-11-16 DIAGNOSIS — G4733 Obstructive sleep apnea (adult) (pediatric): Secondary | ICD-10-CM | POA: Diagnosis not present

## 2017-11-16 DIAGNOSIS — J449 Chronic obstructive pulmonary disease, unspecified: Secondary | ICD-10-CM | POA: Diagnosis not present

## 2017-11-16 DIAGNOSIS — G2581 Restless legs syndrome: Secondary | ICD-10-CM | POA: Diagnosis not present

## 2017-11-16 DIAGNOSIS — E1151 Type 2 diabetes mellitus with diabetic peripheral angiopathy without gangrene: Secondary | ICD-10-CM | POA: Diagnosis not present

## 2017-11-16 DIAGNOSIS — I509 Heart failure, unspecified: Secondary | ICD-10-CM | POA: Diagnosis not present

## 2017-11-16 DIAGNOSIS — R69 Illness, unspecified: Secondary | ICD-10-CM | POA: Diagnosis not present

## 2017-11-16 DIAGNOSIS — L97222 Non-pressure chronic ulcer of left calf with fat layer exposed: Secondary | ICD-10-CM | POA: Diagnosis not present

## 2017-11-18 DIAGNOSIS — Z87440 Personal history of urinary (tract) infections: Secondary | ICD-10-CM | POA: Diagnosis not present

## 2017-11-18 DIAGNOSIS — R69 Illness, unspecified: Secondary | ICD-10-CM | POA: Diagnosis not present

## 2017-11-18 DIAGNOSIS — G2581 Restless legs syndrome: Secondary | ICD-10-CM | POA: Diagnosis not present

## 2017-11-18 DIAGNOSIS — Z8572 Personal history of non-Hodgkin lymphomas: Secondary | ICD-10-CM | POA: Diagnosis not present

## 2017-11-18 DIAGNOSIS — I509 Heart failure, unspecified: Secondary | ICD-10-CM | POA: Diagnosis not present

## 2017-11-18 DIAGNOSIS — L97222 Non-pressure chronic ulcer of left calf with fat layer exposed: Secondary | ICD-10-CM | POA: Diagnosis not present

## 2017-11-18 DIAGNOSIS — J449 Chronic obstructive pulmonary disease, unspecified: Secondary | ICD-10-CM | POA: Diagnosis not present

## 2017-11-18 DIAGNOSIS — I872 Venous insufficiency (chronic) (peripheral): Secondary | ICD-10-CM | POA: Diagnosis not present

## 2017-11-18 DIAGNOSIS — E1151 Type 2 diabetes mellitus with diabetic peripheral angiopathy without gangrene: Secondary | ICD-10-CM | POA: Diagnosis not present

## 2017-11-18 DIAGNOSIS — G4733 Obstructive sleep apnea (adult) (pediatric): Secondary | ICD-10-CM | POA: Diagnosis not present

## 2017-11-20 DIAGNOSIS — J449 Chronic obstructive pulmonary disease, unspecified: Secondary | ICD-10-CM | POA: Diagnosis not present

## 2017-11-20 DIAGNOSIS — E1151 Type 2 diabetes mellitus with diabetic peripheral angiopathy without gangrene: Secondary | ICD-10-CM | POA: Diagnosis not present

## 2017-11-20 DIAGNOSIS — I509 Heart failure, unspecified: Secondary | ICD-10-CM | POA: Diagnosis not present

## 2017-11-20 DIAGNOSIS — G2581 Restless legs syndrome: Secondary | ICD-10-CM | POA: Diagnosis not present

## 2017-11-20 DIAGNOSIS — L97222 Non-pressure chronic ulcer of left calf with fat layer exposed: Secondary | ICD-10-CM | POA: Diagnosis not present

## 2017-11-20 DIAGNOSIS — R69 Illness, unspecified: Secondary | ICD-10-CM | POA: Diagnosis not present

## 2017-11-20 DIAGNOSIS — G4733 Obstructive sleep apnea (adult) (pediatric): Secondary | ICD-10-CM | POA: Diagnosis not present

## 2017-11-20 DIAGNOSIS — Z8572 Personal history of non-Hodgkin lymphomas: Secondary | ICD-10-CM | POA: Diagnosis not present

## 2017-11-20 DIAGNOSIS — Z87440 Personal history of urinary (tract) infections: Secondary | ICD-10-CM | POA: Diagnosis not present

## 2017-11-20 DIAGNOSIS — I872 Venous insufficiency (chronic) (peripheral): Secondary | ICD-10-CM | POA: Diagnosis not present

## 2017-11-22 DIAGNOSIS — Z87440 Personal history of urinary (tract) infections: Secondary | ICD-10-CM | POA: Diagnosis not present

## 2017-11-22 DIAGNOSIS — L97222 Non-pressure chronic ulcer of left calf with fat layer exposed: Secondary | ICD-10-CM | POA: Diagnosis not present

## 2017-11-22 DIAGNOSIS — Z8572 Personal history of non-Hodgkin lymphomas: Secondary | ICD-10-CM | POA: Diagnosis not present

## 2017-11-22 DIAGNOSIS — J449 Chronic obstructive pulmonary disease, unspecified: Secondary | ICD-10-CM | POA: Diagnosis not present

## 2017-11-22 DIAGNOSIS — R69 Illness, unspecified: Secondary | ICD-10-CM | POA: Diagnosis not present

## 2017-11-22 DIAGNOSIS — G2581 Restless legs syndrome: Secondary | ICD-10-CM | POA: Diagnosis not present

## 2017-11-22 DIAGNOSIS — I509 Heart failure, unspecified: Secondary | ICD-10-CM | POA: Diagnosis not present

## 2017-11-22 DIAGNOSIS — E1151 Type 2 diabetes mellitus with diabetic peripheral angiopathy without gangrene: Secondary | ICD-10-CM | POA: Diagnosis not present

## 2017-11-22 DIAGNOSIS — G4733 Obstructive sleep apnea (adult) (pediatric): Secondary | ICD-10-CM | POA: Diagnosis not present

## 2017-11-22 DIAGNOSIS — I872 Venous insufficiency (chronic) (peripheral): Secondary | ICD-10-CM | POA: Diagnosis not present

## 2017-11-25 DIAGNOSIS — I509 Heart failure, unspecified: Secondary | ICD-10-CM | POA: Diagnosis not present

## 2017-11-25 DIAGNOSIS — Z87440 Personal history of urinary (tract) infections: Secondary | ICD-10-CM | POA: Diagnosis not present

## 2017-11-25 DIAGNOSIS — I872 Venous insufficiency (chronic) (peripheral): Secondary | ICD-10-CM | POA: Diagnosis not present

## 2017-11-25 DIAGNOSIS — Z8572 Personal history of non-Hodgkin lymphomas: Secondary | ICD-10-CM | POA: Diagnosis not present

## 2017-11-25 DIAGNOSIS — G4733 Obstructive sleep apnea (adult) (pediatric): Secondary | ICD-10-CM | POA: Diagnosis not present

## 2017-11-25 DIAGNOSIS — J449 Chronic obstructive pulmonary disease, unspecified: Secondary | ICD-10-CM | POA: Diagnosis not present

## 2017-11-25 DIAGNOSIS — G2581 Restless legs syndrome: Secondary | ICD-10-CM | POA: Diagnosis not present

## 2017-11-25 DIAGNOSIS — L97222 Non-pressure chronic ulcer of left calf with fat layer exposed: Secondary | ICD-10-CM | POA: Diagnosis not present

## 2017-11-25 DIAGNOSIS — E1151 Type 2 diabetes mellitus with diabetic peripheral angiopathy without gangrene: Secondary | ICD-10-CM | POA: Diagnosis not present

## 2017-11-25 DIAGNOSIS — R69 Illness, unspecified: Secondary | ICD-10-CM | POA: Diagnosis not present

## 2017-11-27 DIAGNOSIS — I872 Venous insufficiency (chronic) (peripheral): Secondary | ICD-10-CM | POA: Diagnosis not present

## 2017-11-27 DIAGNOSIS — G2581 Restless legs syndrome: Secondary | ICD-10-CM | POA: Diagnosis not present

## 2017-11-27 DIAGNOSIS — L97222 Non-pressure chronic ulcer of left calf with fat layer exposed: Secondary | ICD-10-CM | POA: Diagnosis not present

## 2017-11-27 DIAGNOSIS — E1151 Type 2 diabetes mellitus with diabetic peripheral angiopathy without gangrene: Secondary | ICD-10-CM | POA: Diagnosis not present

## 2017-11-27 DIAGNOSIS — J449 Chronic obstructive pulmonary disease, unspecified: Secondary | ICD-10-CM | POA: Diagnosis not present

## 2017-11-27 DIAGNOSIS — R69 Illness, unspecified: Secondary | ICD-10-CM | POA: Diagnosis not present

## 2017-11-27 DIAGNOSIS — G4733 Obstructive sleep apnea (adult) (pediatric): Secondary | ICD-10-CM | POA: Diagnosis not present

## 2017-11-27 DIAGNOSIS — Z87440 Personal history of urinary (tract) infections: Secondary | ICD-10-CM | POA: Diagnosis not present

## 2017-11-27 DIAGNOSIS — I509 Heart failure, unspecified: Secondary | ICD-10-CM | POA: Diagnosis not present

## 2017-11-27 DIAGNOSIS — Z8572 Personal history of non-Hodgkin lymphomas: Secondary | ICD-10-CM | POA: Diagnosis not present

## 2017-11-29 DIAGNOSIS — G4733 Obstructive sleep apnea (adult) (pediatric): Secondary | ICD-10-CM | POA: Diagnosis not present

## 2017-11-29 DIAGNOSIS — Z8572 Personal history of non-Hodgkin lymphomas: Secondary | ICD-10-CM | POA: Diagnosis not present

## 2017-11-29 DIAGNOSIS — L97222 Non-pressure chronic ulcer of left calf with fat layer exposed: Secondary | ICD-10-CM | POA: Diagnosis not present

## 2017-11-29 DIAGNOSIS — E1151 Type 2 diabetes mellitus with diabetic peripheral angiopathy without gangrene: Secondary | ICD-10-CM | POA: Diagnosis not present

## 2017-11-29 DIAGNOSIS — I509 Heart failure, unspecified: Secondary | ICD-10-CM | POA: Diagnosis not present

## 2017-11-29 DIAGNOSIS — R69 Illness, unspecified: Secondary | ICD-10-CM | POA: Diagnosis not present

## 2017-11-29 DIAGNOSIS — J449 Chronic obstructive pulmonary disease, unspecified: Secondary | ICD-10-CM | POA: Diagnosis not present

## 2017-11-29 DIAGNOSIS — G2581 Restless legs syndrome: Secondary | ICD-10-CM | POA: Diagnosis not present

## 2017-11-29 DIAGNOSIS — Z87440 Personal history of urinary (tract) infections: Secondary | ICD-10-CM | POA: Diagnosis not present

## 2017-11-29 DIAGNOSIS — I872 Venous insufficiency (chronic) (peripheral): Secondary | ICD-10-CM | POA: Diagnosis not present

## 2017-12-02 DIAGNOSIS — R69 Illness, unspecified: Secondary | ICD-10-CM | POA: Diagnosis not present

## 2017-12-02 DIAGNOSIS — G2581 Restless legs syndrome: Secondary | ICD-10-CM | POA: Diagnosis not present

## 2017-12-02 DIAGNOSIS — Z6841 Body Mass Index (BMI) 40.0 and over, adult: Secondary | ICD-10-CM | POA: Diagnosis not present

## 2017-12-02 DIAGNOSIS — E1151 Type 2 diabetes mellitus with diabetic peripheral angiopathy without gangrene: Secondary | ICD-10-CM | POA: Diagnosis not present

## 2017-12-02 DIAGNOSIS — Z87440 Personal history of urinary (tract) infections: Secondary | ICD-10-CM | POA: Diagnosis not present

## 2017-12-02 DIAGNOSIS — Z8572 Personal history of non-Hodgkin lymphomas: Secondary | ICD-10-CM | POA: Diagnosis not present

## 2017-12-02 DIAGNOSIS — I10 Essential (primary) hypertension: Secondary | ICD-10-CM | POA: Diagnosis not present

## 2017-12-02 DIAGNOSIS — I509 Heart failure, unspecified: Secondary | ICD-10-CM | POA: Diagnosis not present

## 2017-12-02 DIAGNOSIS — G4733 Obstructive sleep apnea (adult) (pediatric): Secondary | ICD-10-CM | POA: Diagnosis not present

## 2017-12-02 DIAGNOSIS — J449 Chronic obstructive pulmonary disease, unspecified: Secondary | ICD-10-CM | POA: Diagnosis not present

## 2017-12-02 DIAGNOSIS — L97222 Non-pressure chronic ulcer of left calf with fat layer exposed: Secondary | ICD-10-CM | POA: Diagnosis not present

## 2017-12-02 DIAGNOSIS — I872 Venous insufficiency (chronic) (peripheral): Secondary | ICD-10-CM | POA: Diagnosis not present

## 2017-12-04 DIAGNOSIS — L97828 Non-pressure chronic ulcer of other part of left lower leg with other specified severity: Secondary | ICD-10-CM | POA: Diagnosis not present

## 2017-12-06 DIAGNOSIS — R69 Illness, unspecified: Secondary | ICD-10-CM | POA: Diagnosis not present

## 2017-12-06 DIAGNOSIS — I509 Heart failure, unspecified: Secondary | ICD-10-CM | POA: Diagnosis not present

## 2017-12-06 DIAGNOSIS — J449 Chronic obstructive pulmonary disease, unspecified: Secondary | ICD-10-CM | POA: Diagnosis not present

## 2017-12-06 DIAGNOSIS — G2581 Restless legs syndrome: Secondary | ICD-10-CM | POA: Diagnosis not present

## 2017-12-06 DIAGNOSIS — Z87440 Personal history of urinary (tract) infections: Secondary | ICD-10-CM | POA: Diagnosis not present

## 2017-12-06 DIAGNOSIS — L97222 Non-pressure chronic ulcer of left calf with fat layer exposed: Secondary | ICD-10-CM | POA: Diagnosis not present

## 2017-12-06 DIAGNOSIS — Z8572 Personal history of non-Hodgkin lymphomas: Secondary | ICD-10-CM | POA: Diagnosis not present

## 2017-12-06 DIAGNOSIS — I872 Venous insufficiency (chronic) (peripheral): Secondary | ICD-10-CM | POA: Diagnosis not present

## 2017-12-06 DIAGNOSIS — G4733 Obstructive sleep apnea (adult) (pediatric): Secondary | ICD-10-CM | POA: Diagnosis not present

## 2017-12-06 DIAGNOSIS — E1151 Type 2 diabetes mellitus with diabetic peripheral angiopathy without gangrene: Secondary | ICD-10-CM | POA: Diagnosis not present

## 2017-12-09 DIAGNOSIS — G2581 Restless legs syndrome: Secondary | ICD-10-CM | POA: Diagnosis not present

## 2017-12-09 DIAGNOSIS — Z87440 Personal history of urinary (tract) infections: Secondary | ICD-10-CM | POA: Diagnosis not present

## 2017-12-09 DIAGNOSIS — L97222 Non-pressure chronic ulcer of left calf with fat layer exposed: Secondary | ICD-10-CM | POA: Diagnosis not present

## 2017-12-09 DIAGNOSIS — Z8572 Personal history of non-Hodgkin lymphomas: Secondary | ICD-10-CM | POA: Diagnosis not present

## 2017-12-09 DIAGNOSIS — J449 Chronic obstructive pulmonary disease, unspecified: Secondary | ICD-10-CM | POA: Diagnosis not present

## 2017-12-09 DIAGNOSIS — E1151 Type 2 diabetes mellitus with diabetic peripheral angiopathy without gangrene: Secondary | ICD-10-CM | POA: Diagnosis not present

## 2017-12-09 DIAGNOSIS — I872 Venous insufficiency (chronic) (peripheral): Secondary | ICD-10-CM | POA: Diagnosis not present

## 2017-12-09 DIAGNOSIS — R69 Illness, unspecified: Secondary | ICD-10-CM | POA: Diagnosis not present

## 2017-12-09 DIAGNOSIS — G4733 Obstructive sleep apnea (adult) (pediatric): Secondary | ICD-10-CM | POA: Diagnosis not present

## 2017-12-09 DIAGNOSIS — I509 Heart failure, unspecified: Secondary | ICD-10-CM | POA: Diagnosis not present

## 2017-12-11 DIAGNOSIS — I872 Venous insufficiency (chronic) (peripheral): Secondary | ICD-10-CM | POA: Diagnosis not present

## 2017-12-11 DIAGNOSIS — L97922 Non-pressure chronic ulcer of unspecified part of left lower leg with fat layer exposed: Secondary | ICD-10-CM | POA: Diagnosis not present

## 2017-12-11 DIAGNOSIS — L97828 Non-pressure chronic ulcer of other part of left lower leg with other specified severity: Secondary | ICD-10-CM | POA: Diagnosis not present

## 2017-12-13 DIAGNOSIS — I509 Heart failure, unspecified: Secondary | ICD-10-CM | POA: Diagnosis not present

## 2017-12-13 DIAGNOSIS — J449 Chronic obstructive pulmonary disease, unspecified: Secondary | ICD-10-CM | POA: Diagnosis not present

## 2017-12-13 DIAGNOSIS — Z87440 Personal history of urinary (tract) infections: Secondary | ICD-10-CM | POA: Diagnosis not present

## 2017-12-13 DIAGNOSIS — G4733 Obstructive sleep apnea (adult) (pediatric): Secondary | ICD-10-CM | POA: Diagnosis not present

## 2017-12-13 DIAGNOSIS — I872 Venous insufficiency (chronic) (peripheral): Secondary | ICD-10-CM | POA: Diagnosis not present

## 2017-12-13 DIAGNOSIS — Z8572 Personal history of non-Hodgkin lymphomas: Secondary | ICD-10-CM | POA: Diagnosis not present

## 2017-12-13 DIAGNOSIS — G2581 Restless legs syndrome: Secondary | ICD-10-CM | POA: Diagnosis not present

## 2017-12-13 DIAGNOSIS — L97222 Non-pressure chronic ulcer of left calf with fat layer exposed: Secondary | ICD-10-CM | POA: Diagnosis not present

## 2017-12-13 DIAGNOSIS — R69 Illness, unspecified: Secondary | ICD-10-CM | POA: Diagnosis not present

## 2017-12-13 DIAGNOSIS — E1151 Type 2 diabetes mellitus with diabetic peripheral angiopathy without gangrene: Secondary | ICD-10-CM | POA: Diagnosis not present

## 2017-12-16 DIAGNOSIS — G4733 Obstructive sleep apnea (adult) (pediatric): Secondary | ICD-10-CM | POA: Diagnosis not present

## 2017-12-16 DIAGNOSIS — G2581 Restless legs syndrome: Secondary | ICD-10-CM | POA: Diagnosis not present

## 2017-12-16 DIAGNOSIS — Z8572 Personal history of non-Hodgkin lymphomas: Secondary | ICD-10-CM | POA: Diagnosis not present

## 2017-12-16 DIAGNOSIS — L97222 Non-pressure chronic ulcer of left calf with fat layer exposed: Secondary | ICD-10-CM | POA: Diagnosis not present

## 2017-12-16 DIAGNOSIS — I872 Venous insufficiency (chronic) (peripheral): Secondary | ICD-10-CM | POA: Diagnosis not present

## 2017-12-16 DIAGNOSIS — Z87440 Personal history of urinary (tract) infections: Secondary | ICD-10-CM | POA: Diagnosis not present

## 2017-12-16 DIAGNOSIS — J449 Chronic obstructive pulmonary disease, unspecified: Secondary | ICD-10-CM | POA: Diagnosis not present

## 2017-12-16 DIAGNOSIS — R69 Illness, unspecified: Secondary | ICD-10-CM | POA: Diagnosis not present

## 2017-12-16 DIAGNOSIS — I509 Heart failure, unspecified: Secondary | ICD-10-CM | POA: Diagnosis not present

## 2017-12-16 DIAGNOSIS — E1151 Type 2 diabetes mellitus with diabetic peripheral angiopathy without gangrene: Secondary | ICD-10-CM | POA: Diagnosis not present

## 2017-12-18 DIAGNOSIS — I872 Venous insufficiency (chronic) (peripheral): Secondary | ICD-10-CM | POA: Diagnosis not present

## 2017-12-18 DIAGNOSIS — R600 Localized edema: Secondary | ICD-10-CM | POA: Diagnosis not present

## 2017-12-18 DIAGNOSIS — L97922 Non-pressure chronic ulcer of unspecified part of left lower leg with fat layer exposed: Secondary | ICD-10-CM | POA: Diagnosis not present

## 2017-12-18 DIAGNOSIS — L97928 Non-pressure chronic ulcer of unspecified part of left lower leg with other specified severity: Secondary | ICD-10-CM | POA: Diagnosis not present

## 2017-12-20 DIAGNOSIS — R69 Illness, unspecified: Secondary | ICD-10-CM | POA: Diagnosis not present

## 2017-12-20 DIAGNOSIS — Z8572 Personal history of non-Hodgkin lymphomas: Secondary | ICD-10-CM | POA: Diagnosis not present

## 2017-12-20 DIAGNOSIS — G4733 Obstructive sleep apnea (adult) (pediatric): Secondary | ICD-10-CM | POA: Diagnosis not present

## 2017-12-20 DIAGNOSIS — E1151 Type 2 diabetes mellitus with diabetic peripheral angiopathy without gangrene: Secondary | ICD-10-CM | POA: Diagnosis not present

## 2017-12-20 DIAGNOSIS — L97222 Non-pressure chronic ulcer of left calf with fat layer exposed: Secondary | ICD-10-CM | POA: Diagnosis not present

## 2017-12-20 DIAGNOSIS — I872 Venous insufficiency (chronic) (peripheral): Secondary | ICD-10-CM | POA: Diagnosis not present

## 2017-12-20 DIAGNOSIS — G2581 Restless legs syndrome: Secondary | ICD-10-CM | POA: Diagnosis not present

## 2017-12-20 DIAGNOSIS — Z87440 Personal history of urinary (tract) infections: Secondary | ICD-10-CM | POA: Diagnosis not present

## 2017-12-20 DIAGNOSIS — J449 Chronic obstructive pulmonary disease, unspecified: Secondary | ICD-10-CM | POA: Diagnosis not present

## 2017-12-20 DIAGNOSIS — I509 Heart failure, unspecified: Secondary | ICD-10-CM | POA: Diagnosis not present

## 2017-12-23 DIAGNOSIS — E1151 Type 2 diabetes mellitus with diabetic peripheral angiopathy without gangrene: Secondary | ICD-10-CM | POA: Diagnosis not present

## 2017-12-23 DIAGNOSIS — L97222 Non-pressure chronic ulcer of left calf with fat layer exposed: Secondary | ICD-10-CM | POA: Diagnosis not present

## 2017-12-23 DIAGNOSIS — J449 Chronic obstructive pulmonary disease, unspecified: Secondary | ICD-10-CM | POA: Diagnosis not present

## 2017-12-23 DIAGNOSIS — G4733 Obstructive sleep apnea (adult) (pediatric): Secondary | ICD-10-CM | POA: Diagnosis not present

## 2017-12-23 DIAGNOSIS — G2581 Restless legs syndrome: Secondary | ICD-10-CM | POA: Diagnosis not present

## 2017-12-23 DIAGNOSIS — Z87440 Personal history of urinary (tract) infections: Secondary | ICD-10-CM | POA: Diagnosis not present

## 2017-12-23 DIAGNOSIS — I509 Heart failure, unspecified: Secondary | ICD-10-CM | POA: Diagnosis not present

## 2017-12-23 DIAGNOSIS — R69 Illness, unspecified: Secondary | ICD-10-CM | POA: Diagnosis not present

## 2017-12-23 DIAGNOSIS — I872 Venous insufficiency (chronic) (peripheral): Secondary | ICD-10-CM | POA: Diagnosis not present

## 2017-12-23 DIAGNOSIS — Z8572 Personal history of non-Hodgkin lymphomas: Secondary | ICD-10-CM | POA: Diagnosis not present

## 2017-12-25 DIAGNOSIS — J449 Chronic obstructive pulmonary disease, unspecified: Secondary | ICD-10-CM | POA: Diagnosis not present

## 2017-12-25 DIAGNOSIS — G4733 Obstructive sleep apnea (adult) (pediatric): Secondary | ICD-10-CM | POA: Diagnosis not present

## 2017-12-25 DIAGNOSIS — E1151 Type 2 diabetes mellitus with diabetic peripheral angiopathy without gangrene: Secondary | ICD-10-CM | POA: Diagnosis not present

## 2017-12-25 DIAGNOSIS — Z87440 Personal history of urinary (tract) infections: Secondary | ICD-10-CM | POA: Diagnosis not present

## 2017-12-25 DIAGNOSIS — I872 Venous insufficiency (chronic) (peripheral): Secondary | ICD-10-CM | POA: Diagnosis not present

## 2017-12-25 DIAGNOSIS — L97222 Non-pressure chronic ulcer of left calf with fat layer exposed: Secondary | ICD-10-CM | POA: Diagnosis not present

## 2017-12-25 DIAGNOSIS — R69 Illness, unspecified: Secondary | ICD-10-CM | POA: Diagnosis not present

## 2017-12-25 DIAGNOSIS — I509 Heart failure, unspecified: Secondary | ICD-10-CM | POA: Diagnosis not present

## 2017-12-25 DIAGNOSIS — Z8572 Personal history of non-Hodgkin lymphomas: Secondary | ICD-10-CM | POA: Diagnosis not present

## 2017-12-25 DIAGNOSIS — G2581 Restless legs syndrome: Secondary | ICD-10-CM | POA: Diagnosis not present

## 2017-12-27 DIAGNOSIS — I509 Heart failure, unspecified: Secondary | ICD-10-CM | POA: Diagnosis not present

## 2017-12-27 DIAGNOSIS — E1151 Type 2 diabetes mellitus with diabetic peripheral angiopathy without gangrene: Secondary | ICD-10-CM | POA: Diagnosis not present

## 2017-12-27 DIAGNOSIS — G2581 Restless legs syndrome: Secondary | ICD-10-CM | POA: Diagnosis not present

## 2017-12-27 DIAGNOSIS — Z87440 Personal history of urinary (tract) infections: Secondary | ICD-10-CM | POA: Diagnosis not present

## 2017-12-27 DIAGNOSIS — L97222 Non-pressure chronic ulcer of left calf with fat layer exposed: Secondary | ICD-10-CM | POA: Diagnosis not present

## 2017-12-27 DIAGNOSIS — R69 Illness, unspecified: Secondary | ICD-10-CM | POA: Diagnosis not present

## 2017-12-27 DIAGNOSIS — I872 Venous insufficiency (chronic) (peripheral): Secondary | ICD-10-CM | POA: Diagnosis not present

## 2017-12-27 DIAGNOSIS — G4733 Obstructive sleep apnea (adult) (pediatric): Secondary | ICD-10-CM | POA: Diagnosis not present

## 2017-12-27 DIAGNOSIS — Z8572 Personal history of non-Hodgkin lymphomas: Secondary | ICD-10-CM | POA: Diagnosis not present

## 2017-12-27 DIAGNOSIS — J449 Chronic obstructive pulmonary disease, unspecified: Secondary | ICD-10-CM | POA: Diagnosis not present

## 2017-12-30 DIAGNOSIS — Z87440 Personal history of urinary (tract) infections: Secondary | ICD-10-CM | POA: Diagnosis not present

## 2017-12-30 DIAGNOSIS — L97222 Non-pressure chronic ulcer of left calf with fat layer exposed: Secondary | ICD-10-CM | POA: Diagnosis not present

## 2017-12-30 DIAGNOSIS — E1151 Type 2 diabetes mellitus with diabetic peripheral angiopathy without gangrene: Secondary | ICD-10-CM | POA: Diagnosis not present

## 2017-12-30 DIAGNOSIS — G2581 Restless legs syndrome: Secondary | ICD-10-CM | POA: Diagnosis not present

## 2017-12-30 DIAGNOSIS — I872 Venous insufficiency (chronic) (peripheral): Secondary | ICD-10-CM | POA: Diagnosis not present

## 2017-12-30 DIAGNOSIS — J449 Chronic obstructive pulmonary disease, unspecified: Secondary | ICD-10-CM | POA: Diagnosis not present

## 2017-12-30 DIAGNOSIS — I10 Essential (primary) hypertension: Secondary | ICD-10-CM | POA: Diagnosis not present

## 2017-12-30 DIAGNOSIS — E781 Pure hyperglyceridemia: Secondary | ICD-10-CM | POA: Diagnosis not present

## 2017-12-30 DIAGNOSIS — I509 Heart failure, unspecified: Secondary | ICD-10-CM | POA: Diagnosis not present

## 2017-12-30 DIAGNOSIS — G4733 Obstructive sleep apnea (adult) (pediatric): Secondary | ICD-10-CM | POA: Diagnosis not present

## 2017-12-30 DIAGNOSIS — R69 Illness, unspecified: Secondary | ICD-10-CM | POA: Diagnosis not present

## 2017-12-30 DIAGNOSIS — Z8572 Personal history of non-Hodgkin lymphomas: Secondary | ICD-10-CM | POA: Diagnosis not present

## 2018-01-01 DIAGNOSIS — J449 Chronic obstructive pulmonary disease, unspecified: Secondary | ICD-10-CM | POA: Diagnosis not present

## 2018-01-01 DIAGNOSIS — L97222 Non-pressure chronic ulcer of left calf with fat layer exposed: Secondary | ICD-10-CM | POA: Diagnosis not present

## 2018-01-01 DIAGNOSIS — Z8572 Personal history of non-Hodgkin lymphomas: Secondary | ICD-10-CM | POA: Diagnosis not present

## 2018-01-01 DIAGNOSIS — Z87440 Personal history of urinary (tract) infections: Secondary | ICD-10-CM | POA: Diagnosis not present

## 2018-01-01 DIAGNOSIS — I509 Heart failure, unspecified: Secondary | ICD-10-CM | POA: Diagnosis not present

## 2018-01-01 DIAGNOSIS — G4733 Obstructive sleep apnea (adult) (pediatric): Secondary | ICD-10-CM | POA: Diagnosis not present

## 2018-01-01 DIAGNOSIS — I872 Venous insufficiency (chronic) (peripheral): Secondary | ICD-10-CM | POA: Diagnosis not present

## 2018-01-01 DIAGNOSIS — R69 Illness, unspecified: Secondary | ICD-10-CM | POA: Diagnosis not present

## 2018-01-01 DIAGNOSIS — E1151 Type 2 diabetes mellitus with diabetic peripheral angiopathy without gangrene: Secondary | ICD-10-CM | POA: Diagnosis not present

## 2018-01-01 DIAGNOSIS — G2581 Restless legs syndrome: Secondary | ICD-10-CM | POA: Diagnosis not present

## 2018-01-03 DIAGNOSIS — Z87440 Personal history of urinary (tract) infections: Secondary | ICD-10-CM | POA: Diagnosis not present

## 2018-01-03 DIAGNOSIS — I872 Venous insufficiency (chronic) (peripheral): Secondary | ICD-10-CM | POA: Diagnosis not present

## 2018-01-03 DIAGNOSIS — L97222 Non-pressure chronic ulcer of left calf with fat layer exposed: Secondary | ICD-10-CM | POA: Diagnosis not present

## 2018-01-03 DIAGNOSIS — Z8572 Personal history of non-Hodgkin lymphomas: Secondary | ICD-10-CM | POA: Diagnosis not present

## 2018-01-03 DIAGNOSIS — I509 Heart failure, unspecified: Secondary | ICD-10-CM | POA: Diagnosis not present

## 2018-01-03 DIAGNOSIS — R69 Illness, unspecified: Secondary | ICD-10-CM | POA: Diagnosis not present

## 2018-01-03 DIAGNOSIS — J449 Chronic obstructive pulmonary disease, unspecified: Secondary | ICD-10-CM | POA: Diagnosis not present

## 2018-01-03 DIAGNOSIS — G2581 Restless legs syndrome: Secondary | ICD-10-CM | POA: Diagnosis not present

## 2018-01-03 DIAGNOSIS — G4733 Obstructive sleep apnea (adult) (pediatric): Secondary | ICD-10-CM | POA: Diagnosis not present

## 2018-01-03 DIAGNOSIS — E1151 Type 2 diabetes mellitus with diabetic peripheral angiopathy without gangrene: Secondary | ICD-10-CM | POA: Diagnosis not present

## 2018-01-06 DIAGNOSIS — J449 Chronic obstructive pulmonary disease, unspecified: Secondary | ICD-10-CM | POA: Diagnosis not present

## 2018-01-06 DIAGNOSIS — Z87440 Personal history of urinary (tract) infections: Secondary | ICD-10-CM | POA: Diagnosis not present

## 2018-01-06 DIAGNOSIS — R69 Illness, unspecified: Secondary | ICD-10-CM | POA: Diagnosis not present

## 2018-01-06 DIAGNOSIS — G4733 Obstructive sleep apnea (adult) (pediatric): Secondary | ICD-10-CM | POA: Diagnosis not present

## 2018-01-06 DIAGNOSIS — L97222 Non-pressure chronic ulcer of left calf with fat layer exposed: Secondary | ICD-10-CM | POA: Diagnosis not present

## 2018-01-06 DIAGNOSIS — I872 Venous insufficiency (chronic) (peripheral): Secondary | ICD-10-CM | POA: Diagnosis not present

## 2018-01-06 DIAGNOSIS — Z8572 Personal history of non-Hodgkin lymphomas: Secondary | ICD-10-CM | POA: Diagnosis not present

## 2018-01-06 DIAGNOSIS — E1151 Type 2 diabetes mellitus with diabetic peripheral angiopathy without gangrene: Secondary | ICD-10-CM | POA: Diagnosis not present

## 2018-01-06 DIAGNOSIS — G2581 Restless legs syndrome: Secondary | ICD-10-CM | POA: Diagnosis not present

## 2018-01-06 DIAGNOSIS — I509 Heart failure, unspecified: Secondary | ICD-10-CM | POA: Diagnosis not present

## 2018-01-08 DIAGNOSIS — I872 Venous insufficiency (chronic) (peripheral): Secondary | ICD-10-CM | POA: Diagnosis not present

## 2018-01-08 DIAGNOSIS — L97222 Non-pressure chronic ulcer of left calf with fat layer exposed: Secondary | ICD-10-CM | POA: Diagnosis not present

## 2018-01-08 DIAGNOSIS — E1151 Type 2 diabetes mellitus with diabetic peripheral angiopathy without gangrene: Secondary | ICD-10-CM | POA: Diagnosis not present

## 2018-01-08 DIAGNOSIS — R69 Illness, unspecified: Secondary | ICD-10-CM | POA: Diagnosis not present

## 2018-01-08 DIAGNOSIS — G4733 Obstructive sleep apnea (adult) (pediatric): Secondary | ICD-10-CM | POA: Diagnosis not present

## 2018-01-08 DIAGNOSIS — Z8572 Personal history of non-Hodgkin lymphomas: Secondary | ICD-10-CM | POA: Diagnosis not present

## 2018-01-08 DIAGNOSIS — I509 Heart failure, unspecified: Secondary | ICD-10-CM | POA: Diagnosis not present

## 2018-01-08 DIAGNOSIS — Z87440 Personal history of urinary (tract) infections: Secondary | ICD-10-CM | POA: Diagnosis not present

## 2018-01-08 DIAGNOSIS — G2581 Restless legs syndrome: Secondary | ICD-10-CM | POA: Diagnosis not present

## 2018-01-08 DIAGNOSIS — J449 Chronic obstructive pulmonary disease, unspecified: Secondary | ICD-10-CM | POA: Diagnosis not present

## 2018-01-09 DIAGNOSIS — G2581 Restless legs syndrome: Secondary | ICD-10-CM | POA: Diagnosis not present

## 2018-01-09 DIAGNOSIS — I872 Venous insufficiency (chronic) (peripheral): Secondary | ICD-10-CM | POA: Diagnosis not present

## 2018-01-09 DIAGNOSIS — L97222 Non-pressure chronic ulcer of left calf with fat layer exposed: Secondary | ICD-10-CM | POA: Diagnosis not present

## 2018-01-09 DIAGNOSIS — Z8572 Personal history of non-Hodgkin lymphomas: Secondary | ICD-10-CM | POA: Diagnosis not present

## 2018-01-09 DIAGNOSIS — J449 Chronic obstructive pulmonary disease, unspecified: Secondary | ICD-10-CM | POA: Diagnosis not present

## 2018-01-09 DIAGNOSIS — E1151 Type 2 diabetes mellitus with diabetic peripheral angiopathy without gangrene: Secondary | ICD-10-CM | POA: Diagnosis not present

## 2018-01-09 DIAGNOSIS — R69 Illness, unspecified: Secondary | ICD-10-CM | POA: Diagnosis not present

## 2018-01-09 DIAGNOSIS — G4733 Obstructive sleep apnea (adult) (pediatric): Secondary | ICD-10-CM | POA: Diagnosis not present

## 2018-01-09 DIAGNOSIS — Z87440 Personal history of urinary (tract) infections: Secondary | ICD-10-CM | POA: Diagnosis not present

## 2018-01-09 DIAGNOSIS — I509 Heart failure, unspecified: Secondary | ICD-10-CM | POA: Diagnosis not present

## 2018-01-12 DIAGNOSIS — I509 Heart failure, unspecified: Secondary | ICD-10-CM | POA: Diagnosis not present

## 2018-01-12 DIAGNOSIS — I872 Venous insufficiency (chronic) (peripheral): Secondary | ICD-10-CM | POA: Diagnosis not present

## 2018-01-12 DIAGNOSIS — J449 Chronic obstructive pulmonary disease, unspecified: Secondary | ICD-10-CM | POA: Diagnosis not present

## 2018-01-12 DIAGNOSIS — R69 Illness, unspecified: Secondary | ICD-10-CM | POA: Diagnosis not present

## 2018-01-12 DIAGNOSIS — E1151 Type 2 diabetes mellitus with diabetic peripheral angiopathy without gangrene: Secondary | ICD-10-CM | POA: Diagnosis not present

## 2018-01-12 DIAGNOSIS — Z8572 Personal history of non-Hodgkin lymphomas: Secondary | ICD-10-CM | POA: Diagnosis not present

## 2018-01-12 DIAGNOSIS — L97222 Non-pressure chronic ulcer of left calf with fat layer exposed: Secondary | ICD-10-CM | POA: Diagnosis not present

## 2018-01-12 DIAGNOSIS — G4733 Obstructive sleep apnea (adult) (pediatric): Secondary | ICD-10-CM | POA: Diagnosis not present

## 2018-01-12 DIAGNOSIS — Z87440 Personal history of urinary (tract) infections: Secondary | ICD-10-CM | POA: Diagnosis not present

## 2018-01-12 DIAGNOSIS — G2581 Restless legs syndrome: Secondary | ICD-10-CM | POA: Diagnosis not present

## 2018-01-13 DIAGNOSIS — M62838 Other muscle spasm: Secondary | ICD-10-CM | POA: Diagnosis not present

## 2018-01-13 DIAGNOSIS — G2581 Restless legs syndrome: Secondary | ICD-10-CM | POA: Diagnosis not present

## 2018-01-14 DIAGNOSIS — I509 Heart failure, unspecified: Secondary | ICD-10-CM | POA: Diagnosis not present

## 2018-01-14 DIAGNOSIS — Z87440 Personal history of urinary (tract) infections: Secondary | ICD-10-CM | POA: Diagnosis not present

## 2018-01-14 DIAGNOSIS — Z8572 Personal history of non-Hodgkin lymphomas: Secondary | ICD-10-CM | POA: Diagnosis not present

## 2018-01-14 DIAGNOSIS — R69 Illness, unspecified: Secondary | ICD-10-CM | POA: Diagnosis not present

## 2018-01-14 DIAGNOSIS — G4733 Obstructive sleep apnea (adult) (pediatric): Secondary | ICD-10-CM | POA: Diagnosis not present

## 2018-01-14 DIAGNOSIS — I872 Venous insufficiency (chronic) (peripheral): Secondary | ICD-10-CM | POA: Diagnosis not present

## 2018-01-14 DIAGNOSIS — J449 Chronic obstructive pulmonary disease, unspecified: Secondary | ICD-10-CM | POA: Diagnosis not present

## 2018-01-14 DIAGNOSIS — L97222 Non-pressure chronic ulcer of left calf with fat layer exposed: Secondary | ICD-10-CM | POA: Diagnosis not present

## 2018-01-14 DIAGNOSIS — G2581 Restless legs syndrome: Secondary | ICD-10-CM | POA: Diagnosis not present

## 2018-01-14 DIAGNOSIS — E1151 Type 2 diabetes mellitus with diabetic peripheral angiopathy without gangrene: Secondary | ICD-10-CM | POA: Diagnosis not present

## 2018-01-15 DIAGNOSIS — L97221 Non-pressure chronic ulcer of left calf limited to breakdown of skin: Secondary | ICD-10-CM | POA: Diagnosis not present

## 2018-01-15 DIAGNOSIS — G4733 Obstructive sleep apnea (adult) (pediatric): Secondary | ICD-10-CM | POA: Diagnosis not present

## 2018-01-15 DIAGNOSIS — Z87440 Personal history of urinary (tract) infections: Secondary | ICD-10-CM | POA: Diagnosis not present

## 2018-01-15 DIAGNOSIS — I509 Heart failure, unspecified: Secondary | ICD-10-CM | POA: Diagnosis not present

## 2018-01-15 DIAGNOSIS — R69 Illness, unspecified: Secondary | ICD-10-CM | POA: Diagnosis not present

## 2018-01-15 DIAGNOSIS — G2581 Restless legs syndrome: Secondary | ICD-10-CM | POA: Diagnosis not present

## 2018-01-15 DIAGNOSIS — I872 Venous insufficiency (chronic) (peripheral): Secondary | ICD-10-CM | POA: Diagnosis not present

## 2018-01-15 DIAGNOSIS — J449 Chronic obstructive pulmonary disease, unspecified: Secondary | ICD-10-CM | POA: Diagnosis not present

## 2018-01-15 DIAGNOSIS — Z8572 Personal history of non-Hodgkin lymphomas: Secondary | ICD-10-CM | POA: Diagnosis not present

## 2018-01-15 DIAGNOSIS — E1151 Type 2 diabetes mellitus with diabetic peripheral angiopathy without gangrene: Secondary | ICD-10-CM | POA: Diagnosis not present

## 2018-01-17 DIAGNOSIS — J449 Chronic obstructive pulmonary disease, unspecified: Secondary | ICD-10-CM | POA: Diagnosis not present

## 2018-01-17 DIAGNOSIS — G4733 Obstructive sleep apnea (adult) (pediatric): Secondary | ICD-10-CM | POA: Diagnosis not present

## 2018-01-17 DIAGNOSIS — L97221 Non-pressure chronic ulcer of left calf limited to breakdown of skin: Secondary | ICD-10-CM | POA: Diagnosis not present

## 2018-01-17 DIAGNOSIS — Z8572 Personal history of non-Hodgkin lymphomas: Secondary | ICD-10-CM | POA: Diagnosis not present

## 2018-01-17 DIAGNOSIS — E1151 Type 2 diabetes mellitus with diabetic peripheral angiopathy without gangrene: Secondary | ICD-10-CM | POA: Diagnosis not present

## 2018-01-17 DIAGNOSIS — I872 Venous insufficiency (chronic) (peripheral): Secondary | ICD-10-CM | POA: Diagnosis not present

## 2018-01-17 DIAGNOSIS — G2581 Restless legs syndrome: Secondary | ICD-10-CM | POA: Diagnosis not present

## 2018-01-17 DIAGNOSIS — E11622 Type 2 diabetes mellitus with other skin ulcer: Secondary | ICD-10-CM | POA: Diagnosis not present

## 2018-01-17 DIAGNOSIS — I509 Heart failure, unspecified: Secondary | ICD-10-CM | POA: Diagnosis not present

## 2018-01-17 DIAGNOSIS — R69 Illness, unspecified: Secondary | ICD-10-CM | POA: Diagnosis not present

## 2018-01-20 DIAGNOSIS — E662 Morbid (severe) obesity with alveolar hypoventilation: Secondary | ICD-10-CM | POA: Diagnosis not present

## 2018-01-20 DIAGNOSIS — R69 Illness, unspecified: Secondary | ICD-10-CM | POA: Diagnosis not present

## 2018-01-20 DIAGNOSIS — J9602 Acute respiratory failure with hypercapnia: Secondary | ICD-10-CM | POA: Diagnosis not present

## 2018-01-20 DIAGNOSIS — G4733 Obstructive sleep apnea (adult) (pediatric): Secondary | ICD-10-CM | POA: Diagnosis not present

## 2018-01-20 DIAGNOSIS — J449 Chronic obstructive pulmonary disease, unspecified: Secondary | ICD-10-CM | POA: Diagnosis not present

## 2018-01-20 DIAGNOSIS — Z6841 Body Mass Index (BMI) 40.0 and over, adult: Secondary | ICD-10-CM | POA: Diagnosis not present

## 2018-01-20 DIAGNOSIS — E1165 Type 2 diabetes mellitus with hyperglycemia: Secondary | ICD-10-CM | POA: Diagnosis not present

## 2018-01-20 DIAGNOSIS — Z8572 Personal history of non-Hodgkin lymphomas: Secondary | ICD-10-CM | POA: Diagnosis not present

## 2018-01-20 DIAGNOSIS — R0600 Dyspnea, unspecified: Secondary | ICD-10-CM | POA: Diagnosis not present

## 2018-01-20 DIAGNOSIS — L97221 Non-pressure chronic ulcer of left calf limited to breakdown of skin: Secondary | ICD-10-CM | POA: Diagnosis not present

## 2018-01-20 DIAGNOSIS — E876 Hypokalemia: Secondary | ICD-10-CM | POA: Diagnosis not present

## 2018-01-20 DIAGNOSIS — E1151 Type 2 diabetes mellitus with diabetic peripheral angiopathy without gangrene: Secondary | ICD-10-CM | POA: Diagnosis not present

## 2018-01-20 DIAGNOSIS — I509 Heart failure, unspecified: Secondary | ICD-10-CM | POA: Diagnosis not present

## 2018-01-20 DIAGNOSIS — E11622 Type 2 diabetes mellitus with other skin ulcer: Secondary | ICD-10-CM | POA: Diagnosis not present

## 2018-01-20 DIAGNOSIS — L97928 Non-pressure chronic ulcer of unspecified part of left lower leg with other specified severity: Secondary | ICD-10-CM | POA: Diagnosis not present

## 2018-01-20 DIAGNOSIS — J9601 Acute respiratory failure with hypoxia: Secondary | ICD-10-CM | POA: Diagnosis not present

## 2018-01-20 DIAGNOSIS — I110 Hypertensive heart disease with heart failure: Secondary | ICD-10-CM | POA: Diagnosis not present

## 2018-01-20 DIAGNOSIS — G2581 Restless legs syndrome: Secondary | ICD-10-CM | POA: Diagnosis not present

## 2018-01-20 DIAGNOSIS — I872 Venous insufficiency (chronic) (peripheral): Secondary | ICD-10-CM | POA: Diagnosis not present

## 2018-01-20 DIAGNOSIS — I5032 Chronic diastolic (congestive) heart failure: Secondary | ICD-10-CM | POA: Diagnosis not present

## 2018-01-21 DIAGNOSIS — R0602 Shortness of breath: Secondary | ICD-10-CM | POA: Diagnosis not present

## 2018-01-21 DIAGNOSIS — G4733 Obstructive sleep apnea (adult) (pediatric): Secondary | ICD-10-CM | POA: Diagnosis not present

## 2018-01-21 DIAGNOSIS — R0902 Hypoxemia: Secondary | ICD-10-CM | POA: Diagnosis not present

## 2018-01-21 DIAGNOSIS — J9622 Acute and chronic respiratory failure with hypercapnia: Secondary | ICD-10-CM | POA: Diagnosis not present

## 2018-01-21 DIAGNOSIS — L97928 Non-pressure chronic ulcer of unspecified part of left lower leg with other specified severity: Secondary | ICD-10-CM | POA: Diagnosis not present

## 2018-01-21 DIAGNOSIS — I5032 Chronic diastolic (congestive) heart failure: Secondary | ICD-10-CM | POA: Diagnosis not present

## 2018-01-21 DIAGNOSIS — I110 Hypertensive heart disease with heart failure: Secondary | ICD-10-CM | POA: Diagnosis not present

## 2018-01-21 DIAGNOSIS — J9621 Acute and chronic respiratory failure with hypoxia: Secondary | ICD-10-CM | POA: Diagnosis not present

## 2018-01-21 DIAGNOSIS — J9601 Acute respiratory failure with hypoxia: Secondary | ICD-10-CM | POA: Diagnosis not present

## 2018-01-21 DIAGNOSIS — J9602 Acute respiratory failure with hypercapnia: Secondary | ICD-10-CM | POA: Diagnosis not present

## 2018-01-21 DIAGNOSIS — Z6841 Body Mass Index (BMI) 40.0 and over, adult: Secondary | ICD-10-CM | POA: Diagnosis not present

## 2018-01-21 DIAGNOSIS — R0789 Other chest pain: Secondary | ICD-10-CM | POA: Diagnosis not present

## 2018-01-21 DIAGNOSIS — E662 Morbid (severe) obesity with alveolar hypoventilation: Secondary | ICD-10-CM | POA: Diagnosis not present

## 2018-01-21 DIAGNOSIS — E876 Hypokalemia: Secondary | ICD-10-CM | POA: Diagnosis not present

## 2018-01-23 DIAGNOSIS — R69 Illness, unspecified: Secondary | ICD-10-CM | POA: Diagnosis not present

## 2018-01-23 DIAGNOSIS — E1151 Type 2 diabetes mellitus with diabetic peripheral angiopathy without gangrene: Secondary | ICD-10-CM | POA: Diagnosis not present

## 2018-01-23 DIAGNOSIS — E11622 Type 2 diabetes mellitus with other skin ulcer: Secondary | ICD-10-CM | POA: Diagnosis not present

## 2018-01-23 DIAGNOSIS — L97221 Non-pressure chronic ulcer of left calf limited to breakdown of skin: Secondary | ICD-10-CM | POA: Diagnosis not present

## 2018-01-23 DIAGNOSIS — J449 Chronic obstructive pulmonary disease, unspecified: Secondary | ICD-10-CM | POA: Diagnosis not present

## 2018-01-23 DIAGNOSIS — Z8572 Personal history of non-Hodgkin lymphomas: Secondary | ICD-10-CM | POA: Diagnosis not present

## 2018-01-23 DIAGNOSIS — G4733 Obstructive sleep apnea (adult) (pediatric): Secondary | ICD-10-CM | POA: Diagnosis not present

## 2018-01-23 DIAGNOSIS — G2581 Restless legs syndrome: Secondary | ICD-10-CM | POA: Diagnosis not present

## 2018-01-23 DIAGNOSIS — I872 Venous insufficiency (chronic) (peripheral): Secondary | ICD-10-CM | POA: Diagnosis not present

## 2018-01-23 DIAGNOSIS — I509 Heart failure, unspecified: Secondary | ICD-10-CM | POA: Diagnosis not present

## 2018-01-25 DIAGNOSIS — L97221 Non-pressure chronic ulcer of left calf limited to breakdown of skin: Secondary | ICD-10-CM | POA: Diagnosis not present

## 2018-01-25 DIAGNOSIS — I872 Venous insufficiency (chronic) (peripheral): Secondary | ICD-10-CM | POA: Diagnosis not present

## 2018-01-25 DIAGNOSIS — Z8572 Personal history of non-Hodgkin lymphomas: Secondary | ICD-10-CM | POA: Diagnosis not present

## 2018-01-25 DIAGNOSIS — J449 Chronic obstructive pulmonary disease, unspecified: Secondary | ICD-10-CM | POA: Diagnosis not present

## 2018-01-25 DIAGNOSIS — I509 Heart failure, unspecified: Secondary | ICD-10-CM | POA: Diagnosis not present

## 2018-01-25 DIAGNOSIS — E11622 Type 2 diabetes mellitus with other skin ulcer: Secondary | ICD-10-CM | POA: Diagnosis not present

## 2018-01-25 DIAGNOSIS — G2581 Restless legs syndrome: Secondary | ICD-10-CM | POA: Diagnosis not present

## 2018-01-25 DIAGNOSIS — G4733 Obstructive sleep apnea (adult) (pediatric): Secondary | ICD-10-CM | POA: Diagnosis not present

## 2018-01-25 DIAGNOSIS — E1151 Type 2 diabetes mellitus with diabetic peripheral angiopathy without gangrene: Secondary | ICD-10-CM | POA: Diagnosis not present

## 2018-01-25 DIAGNOSIS — R69 Illness, unspecified: Secondary | ICD-10-CM | POA: Diagnosis not present

## 2018-01-27 DIAGNOSIS — L97221 Non-pressure chronic ulcer of left calf limited to breakdown of skin: Secondary | ICD-10-CM | POA: Diagnosis not present

## 2018-01-27 DIAGNOSIS — G2581 Restless legs syndrome: Secondary | ICD-10-CM | POA: Diagnosis not present

## 2018-01-27 DIAGNOSIS — J449 Chronic obstructive pulmonary disease, unspecified: Secondary | ICD-10-CM | POA: Diagnosis not present

## 2018-01-27 DIAGNOSIS — R69 Illness, unspecified: Secondary | ICD-10-CM | POA: Diagnosis not present

## 2018-01-27 DIAGNOSIS — E1151 Type 2 diabetes mellitus with diabetic peripheral angiopathy without gangrene: Secondary | ICD-10-CM | POA: Diagnosis not present

## 2018-01-27 DIAGNOSIS — Z8572 Personal history of non-Hodgkin lymphomas: Secondary | ICD-10-CM | POA: Diagnosis not present

## 2018-01-27 DIAGNOSIS — I872 Venous insufficiency (chronic) (peripheral): Secondary | ICD-10-CM | POA: Diagnosis not present

## 2018-01-27 DIAGNOSIS — I509 Heart failure, unspecified: Secondary | ICD-10-CM | POA: Diagnosis not present

## 2018-01-27 DIAGNOSIS — G4733 Obstructive sleep apnea (adult) (pediatric): Secondary | ICD-10-CM | POA: Diagnosis not present

## 2018-01-27 DIAGNOSIS — E11622 Type 2 diabetes mellitus with other skin ulcer: Secondary | ICD-10-CM | POA: Diagnosis not present

## 2018-01-29 DIAGNOSIS — G2581 Restless legs syndrome: Secondary | ICD-10-CM | POA: Diagnosis not present

## 2018-01-29 DIAGNOSIS — R69 Illness, unspecified: Secondary | ICD-10-CM | POA: Diagnosis not present

## 2018-01-29 DIAGNOSIS — G4733 Obstructive sleep apnea (adult) (pediatric): Secondary | ICD-10-CM | POA: Diagnosis not present

## 2018-01-29 DIAGNOSIS — L97221 Non-pressure chronic ulcer of left calf limited to breakdown of skin: Secondary | ICD-10-CM | POA: Diagnosis not present

## 2018-01-29 DIAGNOSIS — Z8572 Personal history of non-Hodgkin lymphomas: Secondary | ICD-10-CM | POA: Diagnosis not present

## 2018-01-29 DIAGNOSIS — I509 Heart failure, unspecified: Secondary | ICD-10-CM | POA: Diagnosis not present

## 2018-01-29 DIAGNOSIS — I872 Venous insufficiency (chronic) (peripheral): Secondary | ICD-10-CM | POA: Diagnosis not present

## 2018-01-29 DIAGNOSIS — J449 Chronic obstructive pulmonary disease, unspecified: Secondary | ICD-10-CM | POA: Diagnosis not present

## 2018-01-29 DIAGNOSIS — E11622 Type 2 diabetes mellitus with other skin ulcer: Secondary | ICD-10-CM | POA: Diagnosis not present

## 2018-01-29 DIAGNOSIS — E1151 Type 2 diabetes mellitus with diabetic peripheral angiopathy without gangrene: Secondary | ICD-10-CM | POA: Diagnosis not present

## 2018-01-31 DIAGNOSIS — I509 Heart failure, unspecified: Secondary | ICD-10-CM | POA: Diagnosis not present

## 2018-01-31 DIAGNOSIS — G2581 Restless legs syndrome: Secondary | ICD-10-CM | POA: Diagnosis not present

## 2018-01-31 DIAGNOSIS — E1151 Type 2 diabetes mellitus with diabetic peripheral angiopathy without gangrene: Secondary | ICD-10-CM | POA: Diagnosis not present

## 2018-01-31 DIAGNOSIS — Z8572 Personal history of non-Hodgkin lymphomas: Secondary | ICD-10-CM | POA: Diagnosis not present

## 2018-01-31 DIAGNOSIS — R69 Illness, unspecified: Secondary | ICD-10-CM | POA: Diagnosis not present

## 2018-01-31 DIAGNOSIS — L97221 Non-pressure chronic ulcer of left calf limited to breakdown of skin: Secondary | ICD-10-CM | POA: Diagnosis not present

## 2018-01-31 DIAGNOSIS — E11622 Type 2 diabetes mellitus with other skin ulcer: Secondary | ICD-10-CM | POA: Diagnosis not present

## 2018-01-31 DIAGNOSIS — I872 Venous insufficiency (chronic) (peripheral): Secondary | ICD-10-CM | POA: Diagnosis not present

## 2018-01-31 DIAGNOSIS — G4733 Obstructive sleep apnea (adult) (pediatric): Secondary | ICD-10-CM | POA: Diagnosis not present

## 2018-01-31 DIAGNOSIS — J449 Chronic obstructive pulmonary disease, unspecified: Secondary | ICD-10-CM | POA: Diagnosis not present

## 2018-02-03 DIAGNOSIS — I872 Venous insufficiency (chronic) (peripheral): Secondary | ICD-10-CM | POA: Diagnosis not present

## 2018-02-03 DIAGNOSIS — E11622 Type 2 diabetes mellitus with other skin ulcer: Secondary | ICD-10-CM | POA: Diagnosis not present

## 2018-02-03 DIAGNOSIS — G4733 Obstructive sleep apnea (adult) (pediatric): Secondary | ICD-10-CM | POA: Diagnosis not present

## 2018-02-03 DIAGNOSIS — L97221 Non-pressure chronic ulcer of left calf limited to breakdown of skin: Secondary | ICD-10-CM | POA: Diagnosis not present

## 2018-02-03 DIAGNOSIS — Z8572 Personal history of non-Hodgkin lymphomas: Secondary | ICD-10-CM | POA: Diagnosis not present

## 2018-02-03 DIAGNOSIS — J449 Chronic obstructive pulmonary disease, unspecified: Secondary | ICD-10-CM | POA: Diagnosis not present

## 2018-02-03 DIAGNOSIS — E1151 Type 2 diabetes mellitus with diabetic peripheral angiopathy without gangrene: Secondary | ICD-10-CM | POA: Diagnosis not present

## 2018-02-03 DIAGNOSIS — R69 Illness, unspecified: Secondary | ICD-10-CM | POA: Diagnosis not present

## 2018-02-03 DIAGNOSIS — G2581 Restless legs syndrome: Secondary | ICD-10-CM | POA: Diagnosis not present

## 2018-02-03 DIAGNOSIS — I509 Heart failure, unspecified: Secondary | ICD-10-CM | POA: Diagnosis not present

## 2018-02-05 DIAGNOSIS — E11622 Type 2 diabetes mellitus with other skin ulcer: Secondary | ICD-10-CM | POA: Diagnosis not present

## 2018-02-05 DIAGNOSIS — E1151 Type 2 diabetes mellitus with diabetic peripheral angiopathy without gangrene: Secondary | ICD-10-CM | POA: Diagnosis not present

## 2018-02-05 DIAGNOSIS — G4733 Obstructive sleep apnea (adult) (pediatric): Secondary | ICD-10-CM | POA: Diagnosis not present

## 2018-02-05 DIAGNOSIS — J449 Chronic obstructive pulmonary disease, unspecified: Secondary | ICD-10-CM | POA: Diagnosis not present

## 2018-02-05 DIAGNOSIS — I509 Heart failure, unspecified: Secondary | ICD-10-CM | POA: Diagnosis not present

## 2018-02-05 DIAGNOSIS — G2581 Restless legs syndrome: Secondary | ICD-10-CM | POA: Diagnosis not present

## 2018-02-05 DIAGNOSIS — R69 Illness, unspecified: Secondary | ICD-10-CM | POA: Diagnosis not present

## 2018-02-05 DIAGNOSIS — Z8572 Personal history of non-Hodgkin lymphomas: Secondary | ICD-10-CM | POA: Diagnosis not present

## 2018-02-05 DIAGNOSIS — L97221 Non-pressure chronic ulcer of left calf limited to breakdown of skin: Secondary | ICD-10-CM | POA: Diagnosis not present

## 2018-02-05 DIAGNOSIS — I872 Venous insufficiency (chronic) (peripheral): Secondary | ICD-10-CM | POA: Diagnosis not present

## 2018-02-07 DIAGNOSIS — I509 Heart failure, unspecified: Secondary | ICD-10-CM | POA: Diagnosis not present

## 2018-02-07 DIAGNOSIS — L97221 Non-pressure chronic ulcer of left calf limited to breakdown of skin: Secondary | ICD-10-CM | POA: Diagnosis not present

## 2018-02-07 DIAGNOSIS — E1151 Type 2 diabetes mellitus with diabetic peripheral angiopathy without gangrene: Secondary | ICD-10-CM | POA: Diagnosis not present

## 2018-02-07 DIAGNOSIS — E11622 Type 2 diabetes mellitus with other skin ulcer: Secondary | ICD-10-CM | POA: Diagnosis not present

## 2018-02-07 DIAGNOSIS — Z8572 Personal history of non-Hodgkin lymphomas: Secondary | ICD-10-CM | POA: Diagnosis not present

## 2018-02-07 DIAGNOSIS — R69 Illness, unspecified: Secondary | ICD-10-CM | POA: Diagnosis not present

## 2018-02-07 DIAGNOSIS — G4733 Obstructive sleep apnea (adult) (pediatric): Secondary | ICD-10-CM | POA: Diagnosis not present

## 2018-02-07 DIAGNOSIS — G2581 Restless legs syndrome: Secondary | ICD-10-CM | POA: Diagnosis not present

## 2018-02-07 DIAGNOSIS — I872 Venous insufficiency (chronic) (peripheral): Secondary | ICD-10-CM | POA: Diagnosis not present

## 2018-02-07 DIAGNOSIS — J449 Chronic obstructive pulmonary disease, unspecified: Secondary | ICD-10-CM | POA: Diagnosis not present

## 2018-02-10 DIAGNOSIS — J449 Chronic obstructive pulmonary disease, unspecified: Secondary | ICD-10-CM | POA: Diagnosis not present

## 2018-02-10 DIAGNOSIS — E11622 Type 2 diabetes mellitus with other skin ulcer: Secondary | ICD-10-CM | POA: Diagnosis not present

## 2018-02-10 DIAGNOSIS — G2581 Restless legs syndrome: Secondary | ICD-10-CM | POA: Diagnosis not present

## 2018-02-10 DIAGNOSIS — G4733 Obstructive sleep apnea (adult) (pediatric): Secondary | ICD-10-CM | POA: Diagnosis not present

## 2018-02-10 DIAGNOSIS — R69 Illness, unspecified: Secondary | ICD-10-CM | POA: Diagnosis not present

## 2018-02-10 DIAGNOSIS — Z8572 Personal history of non-Hodgkin lymphomas: Secondary | ICD-10-CM | POA: Diagnosis not present

## 2018-02-10 DIAGNOSIS — I509 Heart failure, unspecified: Secondary | ICD-10-CM | POA: Diagnosis not present

## 2018-02-10 DIAGNOSIS — I872 Venous insufficiency (chronic) (peripheral): Secondary | ICD-10-CM | POA: Diagnosis not present

## 2018-02-10 DIAGNOSIS — E1151 Type 2 diabetes mellitus with diabetic peripheral angiopathy without gangrene: Secondary | ICD-10-CM | POA: Diagnosis not present

## 2018-02-10 DIAGNOSIS — L97221 Non-pressure chronic ulcer of left calf limited to breakdown of skin: Secondary | ICD-10-CM | POA: Diagnosis not present

## 2018-02-11 DIAGNOSIS — I509 Heart failure, unspecified: Secondary | ICD-10-CM | POA: Diagnosis not present

## 2018-02-12 DIAGNOSIS — L97828 Non-pressure chronic ulcer of other part of left lower leg with other specified severity: Secondary | ICD-10-CM | POA: Diagnosis not present

## 2018-02-12 DIAGNOSIS — R600 Localized edema: Secondary | ICD-10-CM | POA: Diagnosis not present

## 2018-02-12 DIAGNOSIS — I872 Venous insufficiency (chronic) (peripheral): Secondary | ICD-10-CM | POA: Diagnosis not present

## 2018-02-12 DIAGNOSIS — L97922 Non-pressure chronic ulcer of unspecified part of left lower leg with fat layer exposed: Secondary | ICD-10-CM | POA: Diagnosis not present

## 2018-02-14 DIAGNOSIS — Z8572 Personal history of non-Hodgkin lymphomas: Secondary | ICD-10-CM | POA: Diagnosis not present

## 2018-02-14 DIAGNOSIS — E1151 Type 2 diabetes mellitus with diabetic peripheral angiopathy without gangrene: Secondary | ICD-10-CM | POA: Diagnosis not present

## 2018-02-14 DIAGNOSIS — G2581 Restless legs syndrome: Secondary | ICD-10-CM | POA: Diagnosis not present

## 2018-02-14 DIAGNOSIS — R300 Dysuria: Secondary | ICD-10-CM | POA: Diagnosis not present

## 2018-02-14 DIAGNOSIS — I509 Heart failure, unspecified: Secondary | ICD-10-CM | POA: Diagnosis not present

## 2018-02-14 DIAGNOSIS — R350 Frequency of micturition: Secondary | ICD-10-CM | POA: Diagnosis not present

## 2018-02-14 DIAGNOSIS — I872 Venous insufficiency (chronic) (peripheral): Secondary | ICD-10-CM | POA: Diagnosis not present

## 2018-02-14 DIAGNOSIS — G4733 Obstructive sleep apnea (adult) (pediatric): Secondary | ICD-10-CM | POA: Diagnosis not present

## 2018-02-14 DIAGNOSIS — R69 Illness, unspecified: Secondary | ICD-10-CM | POA: Diagnosis not present

## 2018-02-14 DIAGNOSIS — E11622 Type 2 diabetes mellitus with other skin ulcer: Secondary | ICD-10-CM | POA: Diagnosis not present

## 2018-02-14 DIAGNOSIS — R8290 Unspecified abnormal findings in urine: Secondary | ICD-10-CM | POA: Diagnosis not present

## 2018-02-14 DIAGNOSIS — J449 Chronic obstructive pulmonary disease, unspecified: Secondary | ICD-10-CM | POA: Diagnosis not present

## 2018-02-14 DIAGNOSIS — L97221 Non-pressure chronic ulcer of left calf limited to breakdown of skin: Secondary | ICD-10-CM | POA: Diagnosis not present

## 2018-02-17 DIAGNOSIS — E1151 Type 2 diabetes mellitus with diabetic peripheral angiopathy without gangrene: Secondary | ICD-10-CM | POA: Diagnosis not present

## 2018-02-17 DIAGNOSIS — Z8572 Personal history of non-Hodgkin lymphomas: Secondary | ICD-10-CM | POA: Diagnosis not present

## 2018-02-17 DIAGNOSIS — L97221 Non-pressure chronic ulcer of left calf limited to breakdown of skin: Secondary | ICD-10-CM | POA: Diagnosis not present

## 2018-02-17 DIAGNOSIS — J449 Chronic obstructive pulmonary disease, unspecified: Secondary | ICD-10-CM | POA: Diagnosis not present

## 2018-02-17 DIAGNOSIS — I509 Heart failure, unspecified: Secondary | ICD-10-CM | POA: Diagnosis not present

## 2018-02-17 DIAGNOSIS — G2581 Restless legs syndrome: Secondary | ICD-10-CM | POA: Diagnosis not present

## 2018-02-17 DIAGNOSIS — G4733 Obstructive sleep apnea (adult) (pediatric): Secondary | ICD-10-CM | POA: Diagnosis not present

## 2018-02-17 DIAGNOSIS — I872 Venous insufficiency (chronic) (peripheral): Secondary | ICD-10-CM | POA: Diagnosis not present

## 2018-02-17 DIAGNOSIS — R69 Illness, unspecified: Secondary | ICD-10-CM | POA: Diagnosis not present

## 2018-02-17 DIAGNOSIS — E11622 Type 2 diabetes mellitus with other skin ulcer: Secondary | ICD-10-CM | POA: Diagnosis not present

## 2018-02-19 DIAGNOSIS — R69 Illness, unspecified: Secondary | ICD-10-CM | POA: Diagnosis not present

## 2018-02-19 DIAGNOSIS — E1151 Type 2 diabetes mellitus with diabetic peripheral angiopathy without gangrene: Secondary | ICD-10-CM | POA: Diagnosis not present

## 2018-02-19 DIAGNOSIS — G2581 Restless legs syndrome: Secondary | ICD-10-CM | POA: Diagnosis not present

## 2018-02-19 DIAGNOSIS — I509 Heart failure, unspecified: Secondary | ICD-10-CM | POA: Diagnosis not present

## 2018-02-19 DIAGNOSIS — G4733 Obstructive sleep apnea (adult) (pediatric): Secondary | ICD-10-CM | POA: Diagnosis not present

## 2018-02-19 DIAGNOSIS — Z8572 Personal history of non-Hodgkin lymphomas: Secondary | ICD-10-CM | POA: Diagnosis not present

## 2018-02-19 DIAGNOSIS — L97221 Non-pressure chronic ulcer of left calf limited to breakdown of skin: Secondary | ICD-10-CM | POA: Diagnosis not present

## 2018-02-19 DIAGNOSIS — I872 Venous insufficiency (chronic) (peripheral): Secondary | ICD-10-CM | POA: Diagnosis not present

## 2018-02-19 DIAGNOSIS — J449 Chronic obstructive pulmonary disease, unspecified: Secondary | ICD-10-CM | POA: Diagnosis not present

## 2018-02-19 DIAGNOSIS — E11622 Type 2 diabetes mellitus with other skin ulcer: Secondary | ICD-10-CM | POA: Diagnosis not present

## 2018-02-21 DIAGNOSIS — I872 Venous insufficiency (chronic) (peripheral): Secondary | ICD-10-CM | POA: Diagnosis not present

## 2018-02-21 DIAGNOSIS — R69 Illness, unspecified: Secondary | ICD-10-CM | POA: Diagnosis not present

## 2018-02-21 DIAGNOSIS — J449 Chronic obstructive pulmonary disease, unspecified: Secondary | ICD-10-CM | POA: Diagnosis not present

## 2018-02-21 DIAGNOSIS — G4733 Obstructive sleep apnea (adult) (pediatric): Secondary | ICD-10-CM | POA: Diagnosis not present

## 2018-02-21 DIAGNOSIS — E1151 Type 2 diabetes mellitus with diabetic peripheral angiopathy without gangrene: Secondary | ICD-10-CM | POA: Diagnosis not present

## 2018-02-21 DIAGNOSIS — I509 Heart failure, unspecified: Secondary | ICD-10-CM | POA: Diagnosis not present

## 2018-02-21 DIAGNOSIS — Z8572 Personal history of non-Hodgkin lymphomas: Secondary | ICD-10-CM | POA: Diagnosis not present

## 2018-02-21 DIAGNOSIS — E11622 Type 2 diabetes mellitus with other skin ulcer: Secondary | ICD-10-CM | POA: Diagnosis not present

## 2018-02-21 DIAGNOSIS — L97221 Non-pressure chronic ulcer of left calf limited to breakdown of skin: Secondary | ICD-10-CM | POA: Diagnosis not present

## 2018-02-21 DIAGNOSIS — G2581 Restless legs syndrome: Secondary | ICD-10-CM | POA: Diagnosis not present

## 2018-02-24 DIAGNOSIS — E11622 Type 2 diabetes mellitus with other skin ulcer: Secondary | ICD-10-CM | POA: Diagnosis not present

## 2018-02-24 DIAGNOSIS — I509 Heart failure, unspecified: Secondary | ICD-10-CM | POA: Diagnosis not present

## 2018-02-24 DIAGNOSIS — J449 Chronic obstructive pulmonary disease, unspecified: Secondary | ICD-10-CM | POA: Diagnosis not present

## 2018-02-24 DIAGNOSIS — G2581 Restless legs syndrome: Secondary | ICD-10-CM | POA: Diagnosis not present

## 2018-02-24 DIAGNOSIS — Z8572 Personal history of non-Hodgkin lymphomas: Secondary | ICD-10-CM | POA: Diagnosis not present

## 2018-02-24 DIAGNOSIS — I872 Venous insufficiency (chronic) (peripheral): Secondary | ICD-10-CM | POA: Diagnosis not present

## 2018-02-24 DIAGNOSIS — R69 Illness, unspecified: Secondary | ICD-10-CM | POA: Diagnosis not present

## 2018-02-24 DIAGNOSIS — L97221 Non-pressure chronic ulcer of left calf limited to breakdown of skin: Secondary | ICD-10-CM | POA: Diagnosis not present

## 2018-02-24 DIAGNOSIS — G4733 Obstructive sleep apnea (adult) (pediatric): Secondary | ICD-10-CM | POA: Diagnosis not present

## 2018-02-24 DIAGNOSIS — E1151 Type 2 diabetes mellitus with diabetic peripheral angiopathy without gangrene: Secondary | ICD-10-CM | POA: Diagnosis not present

## 2018-02-26 DIAGNOSIS — L97221 Non-pressure chronic ulcer of left calf limited to breakdown of skin: Secondary | ICD-10-CM | POA: Diagnosis not present

## 2018-02-26 DIAGNOSIS — G2581 Restless legs syndrome: Secondary | ICD-10-CM | POA: Diagnosis not present

## 2018-02-26 DIAGNOSIS — J449 Chronic obstructive pulmonary disease, unspecified: Secondary | ICD-10-CM | POA: Diagnosis not present

## 2018-02-26 DIAGNOSIS — E1151 Type 2 diabetes mellitus with diabetic peripheral angiopathy without gangrene: Secondary | ICD-10-CM | POA: Diagnosis not present

## 2018-02-26 DIAGNOSIS — G4733 Obstructive sleep apnea (adult) (pediatric): Secondary | ICD-10-CM | POA: Diagnosis not present

## 2018-02-26 DIAGNOSIS — I509 Heart failure, unspecified: Secondary | ICD-10-CM | POA: Diagnosis not present

## 2018-02-26 DIAGNOSIS — R69 Illness, unspecified: Secondary | ICD-10-CM | POA: Diagnosis not present

## 2018-02-26 DIAGNOSIS — Z8572 Personal history of non-Hodgkin lymphomas: Secondary | ICD-10-CM | POA: Diagnosis not present

## 2018-02-26 DIAGNOSIS — I872 Venous insufficiency (chronic) (peripheral): Secondary | ICD-10-CM | POA: Diagnosis not present

## 2018-02-26 DIAGNOSIS — E11622 Type 2 diabetes mellitus with other skin ulcer: Secondary | ICD-10-CM | POA: Diagnosis not present

## 2018-02-28 DIAGNOSIS — R69 Illness, unspecified: Secondary | ICD-10-CM | POA: Diagnosis not present

## 2018-02-28 DIAGNOSIS — L97221 Non-pressure chronic ulcer of left calf limited to breakdown of skin: Secondary | ICD-10-CM | POA: Diagnosis not present

## 2018-02-28 DIAGNOSIS — J449 Chronic obstructive pulmonary disease, unspecified: Secondary | ICD-10-CM | POA: Diagnosis not present

## 2018-02-28 DIAGNOSIS — I509 Heart failure, unspecified: Secondary | ICD-10-CM | POA: Diagnosis not present

## 2018-02-28 DIAGNOSIS — I872 Venous insufficiency (chronic) (peripheral): Secondary | ICD-10-CM | POA: Diagnosis not present

## 2018-02-28 DIAGNOSIS — G2581 Restless legs syndrome: Secondary | ICD-10-CM | POA: Diagnosis not present

## 2018-02-28 DIAGNOSIS — Z8572 Personal history of non-Hodgkin lymphomas: Secondary | ICD-10-CM | POA: Diagnosis not present

## 2018-02-28 DIAGNOSIS — G4733 Obstructive sleep apnea (adult) (pediatric): Secondary | ICD-10-CM | POA: Diagnosis not present

## 2018-02-28 DIAGNOSIS — E11622 Type 2 diabetes mellitus with other skin ulcer: Secondary | ICD-10-CM | POA: Diagnosis not present

## 2018-02-28 DIAGNOSIS — E1151 Type 2 diabetes mellitus with diabetic peripheral angiopathy without gangrene: Secondary | ICD-10-CM | POA: Diagnosis not present

## 2018-03-05 DIAGNOSIS — G2581 Restless legs syndrome: Secondary | ICD-10-CM | POA: Diagnosis not present

## 2018-03-05 DIAGNOSIS — L97221 Non-pressure chronic ulcer of left calf limited to breakdown of skin: Secondary | ICD-10-CM | POA: Diagnosis not present

## 2018-03-05 DIAGNOSIS — I872 Venous insufficiency (chronic) (peripheral): Secondary | ICD-10-CM | POA: Diagnosis not present

## 2018-03-05 DIAGNOSIS — R69 Illness, unspecified: Secondary | ICD-10-CM | POA: Diagnosis not present

## 2018-03-05 DIAGNOSIS — I509 Heart failure, unspecified: Secondary | ICD-10-CM | POA: Diagnosis not present

## 2018-03-05 DIAGNOSIS — E1151 Type 2 diabetes mellitus with diabetic peripheral angiopathy without gangrene: Secondary | ICD-10-CM | POA: Diagnosis not present

## 2018-03-05 DIAGNOSIS — E11622 Type 2 diabetes mellitus with other skin ulcer: Secondary | ICD-10-CM | POA: Diagnosis not present

## 2018-03-05 DIAGNOSIS — G4733 Obstructive sleep apnea (adult) (pediatric): Secondary | ICD-10-CM | POA: Diagnosis not present

## 2018-03-05 DIAGNOSIS — J449 Chronic obstructive pulmonary disease, unspecified: Secondary | ICD-10-CM | POA: Diagnosis not present

## 2018-03-05 DIAGNOSIS — Z8572 Personal history of non-Hodgkin lymphomas: Secondary | ICD-10-CM | POA: Diagnosis not present

## 2018-03-06 DIAGNOSIS — E1129 Type 2 diabetes mellitus with other diabetic kidney complication: Secondary | ICD-10-CM | POA: Diagnosis not present

## 2018-03-06 DIAGNOSIS — E782 Mixed hyperlipidemia: Secondary | ICD-10-CM | POA: Diagnosis not present

## 2018-03-06 DIAGNOSIS — I10 Essential (primary) hypertension: Secondary | ICD-10-CM | POA: Diagnosis not present

## 2018-03-06 DIAGNOSIS — Z6841 Body Mass Index (BMI) 40.0 and over, adult: Secondary | ICD-10-CM | POA: Diagnosis not present

## 2018-03-06 DIAGNOSIS — G2581 Restless legs syndrome: Secondary | ICD-10-CM | POA: Diagnosis not present

## 2018-03-06 DIAGNOSIS — Z0001 Encounter for general adult medical examination with abnormal findings: Secondary | ICD-10-CM | POA: Diagnosis not present

## 2018-03-06 DIAGNOSIS — R600 Localized edema: Secondary | ICD-10-CM | POA: Diagnosis not present

## 2018-03-08 DIAGNOSIS — I509 Heart failure, unspecified: Secondary | ICD-10-CM | POA: Diagnosis not present

## 2018-03-08 DIAGNOSIS — E1151 Type 2 diabetes mellitus with diabetic peripheral angiopathy without gangrene: Secondary | ICD-10-CM | POA: Diagnosis not present

## 2018-03-08 DIAGNOSIS — G2581 Restless legs syndrome: Secondary | ICD-10-CM | POA: Diagnosis not present

## 2018-03-08 DIAGNOSIS — J449 Chronic obstructive pulmonary disease, unspecified: Secondary | ICD-10-CM | POA: Diagnosis not present

## 2018-03-08 DIAGNOSIS — Z8572 Personal history of non-Hodgkin lymphomas: Secondary | ICD-10-CM | POA: Diagnosis not present

## 2018-03-08 DIAGNOSIS — R69 Illness, unspecified: Secondary | ICD-10-CM | POA: Diagnosis not present

## 2018-03-08 DIAGNOSIS — L97221 Non-pressure chronic ulcer of left calf limited to breakdown of skin: Secondary | ICD-10-CM | POA: Diagnosis not present

## 2018-03-08 DIAGNOSIS — I872 Venous insufficiency (chronic) (peripheral): Secondary | ICD-10-CM | POA: Diagnosis not present

## 2018-03-08 DIAGNOSIS — G4733 Obstructive sleep apnea (adult) (pediatric): Secondary | ICD-10-CM | POA: Diagnosis not present

## 2018-03-08 DIAGNOSIS — E11622 Type 2 diabetes mellitus with other skin ulcer: Secondary | ICD-10-CM | POA: Diagnosis not present

## 2018-03-10 DIAGNOSIS — J449 Chronic obstructive pulmonary disease, unspecified: Secondary | ICD-10-CM | POA: Diagnosis not present

## 2018-03-10 DIAGNOSIS — E1151 Type 2 diabetes mellitus with diabetic peripheral angiopathy without gangrene: Secondary | ICD-10-CM | POA: Diagnosis not present

## 2018-03-10 DIAGNOSIS — Z8572 Personal history of non-Hodgkin lymphomas: Secondary | ICD-10-CM | POA: Diagnosis not present

## 2018-03-10 DIAGNOSIS — L97221 Non-pressure chronic ulcer of left calf limited to breakdown of skin: Secondary | ICD-10-CM | POA: Diagnosis not present

## 2018-03-10 DIAGNOSIS — R69 Illness, unspecified: Secondary | ICD-10-CM | POA: Diagnosis not present

## 2018-03-10 DIAGNOSIS — G2581 Restless legs syndrome: Secondary | ICD-10-CM | POA: Diagnosis not present

## 2018-03-10 DIAGNOSIS — G4733 Obstructive sleep apnea (adult) (pediatric): Secondary | ICD-10-CM | POA: Diagnosis not present

## 2018-03-10 DIAGNOSIS — I509 Heart failure, unspecified: Secondary | ICD-10-CM | POA: Diagnosis not present

## 2018-03-10 DIAGNOSIS — I872 Venous insufficiency (chronic) (peripheral): Secondary | ICD-10-CM | POA: Diagnosis not present

## 2018-03-10 DIAGNOSIS — E11622 Type 2 diabetes mellitus with other skin ulcer: Secondary | ICD-10-CM | POA: Diagnosis not present

## 2018-03-12 DIAGNOSIS — L97221 Non-pressure chronic ulcer of left calf limited to breakdown of skin: Secondary | ICD-10-CM | POA: Diagnosis not present

## 2018-03-12 DIAGNOSIS — I872 Venous insufficiency (chronic) (peripheral): Secondary | ICD-10-CM | POA: Diagnosis not present

## 2018-03-12 DIAGNOSIS — I509 Heart failure, unspecified: Secondary | ICD-10-CM | POA: Diagnosis not present

## 2018-03-12 DIAGNOSIS — E1151 Type 2 diabetes mellitus with diabetic peripheral angiopathy without gangrene: Secondary | ICD-10-CM | POA: Diagnosis not present

## 2018-03-12 DIAGNOSIS — J449 Chronic obstructive pulmonary disease, unspecified: Secondary | ICD-10-CM | POA: Diagnosis not present

## 2018-03-12 DIAGNOSIS — Z8572 Personal history of non-Hodgkin lymphomas: Secondary | ICD-10-CM | POA: Diagnosis not present

## 2018-03-12 DIAGNOSIS — E11622 Type 2 diabetes mellitus with other skin ulcer: Secondary | ICD-10-CM | POA: Diagnosis not present

## 2018-03-12 DIAGNOSIS — G2581 Restless legs syndrome: Secondary | ICD-10-CM | POA: Diagnosis not present

## 2018-03-12 DIAGNOSIS — R69 Illness, unspecified: Secondary | ICD-10-CM | POA: Diagnosis not present

## 2018-03-12 DIAGNOSIS — G4733 Obstructive sleep apnea (adult) (pediatric): Secondary | ICD-10-CM | POA: Diagnosis not present

## 2018-03-14 DIAGNOSIS — I509 Heart failure, unspecified: Secondary | ICD-10-CM | POA: Diagnosis not present

## 2018-03-14 DIAGNOSIS — G4733 Obstructive sleep apnea (adult) (pediatric): Secondary | ICD-10-CM | POA: Diagnosis not present

## 2018-03-14 DIAGNOSIS — I872 Venous insufficiency (chronic) (peripheral): Secondary | ICD-10-CM | POA: Diagnosis not present

## 2018-03-14 DIAGNOSIS — J449 Chronic obstructive pulmonary disease, unspecified: Secondary | ICD-10-CM | POA: Diagnosis not present

## 2018-03-14 DIAGNOSIS — E1151 Type 2 diabetes mellitus with diabetic peripheral angiopathy without gangrene: Secondary | ICD-10-CM | POA: Diagnosis not present

## 2018-03-14 DIAGNOSIS — G2581 Restless legs syndrome: Secondary | ICD-10-CM | POA: Diagnosis not present

## 2018-03-14 DIAGNOSIS — E11622 Type 2 diabetes mellitus with other skin ulcer: Secondary | ICD-10-CM | POA: Diagnosis not present

## 2018-03-14 DIAGNOSIS — L97221 Non-pressure chronic ulcer of left calf limited to breakdown of skin: Secondary | ICD-10-CM | POA: Diagnosis not present

## 2018-03-14 DIAGNOSIS — Z8572 Personal history of non-Hodgkin lymphomas: Secondary | ICD-10-CM | POA: Diagnosis not present

## 2018-03-14 DIAGNOSIS — R69 Illness, unspecified: Secondary | ICD-10-CM | POA: Diagnosis not present

## 2018-03-17 DIAGNOSIS — L97221 Non-pressure chronic ulcer of left calf limited to breakdown of skin: Secondary | ICD-10-CM | POA: Diagnosis not present

## 2018-03-17 DIAGNOSIS — Z8572 Personal history of non-Hodgkin lymphomas: Secondary | ICD-10-CM | POA: Diagnosis not present

## 2018-03-17 DIAGNOSIS — E1151 Type 2 diabetes mellitus with diabetic peripheral angiopathy without gangrene: Secondary | ICD-10-CM | POA: Diagnosis not present

## 2018-03-17 DIAGNOSIS — R69 Illness, unspecified: Secondary | ICD-10-CM | POA: Diagnosis not present

## 2018-03-17 DIAGNOSIS — I872 Venous insufficiency (chronic) (peripheral): Secondary | ICD-10-CM | POA: Diagnosis not present

## 2018-03-17 DIAGNOSIS — H43811 Vitreous degeneration, right eye: Secondary | ICD-10-CM | POA: Diagnosis not present

## 2018-03-17 DIAGNOSIS — I509 Heart failure, unspecified: Secondary | ICD-10-CM | POA: Diagnosis not present

## 2018-03-17 DIAGNOSIS — J449 Chronic obstructive pulmonary disease, unspecified: Secondary | ICD-10-CM | POA: Diagnosis not present

## 2018-03-17 DIAGNOSIS — H2513 Age-related nuclear cataract, bilateral: Secondary | ICD-10-CM | POA: Diagnosis not present

## 2018-03-17 DIAGNOSIS — G2581 Restless legs syndrome: Secondary | ICD-10-CM | POA: Diagnosis not present

## 2018-03-17 DIAGNOSIS — G4733 Obstructive sleep apnea (adult) (pediatric): Secondary | ICD-10-CM | POA: Diagnosis not present

## 2018-03-17 DIAGNOSIS — H52223 Regular astigmatism, bilateral: Secondary | ICD-10-CM | POA: Diagnosis not present

## 2018-03-17 DIAGNOSIS — H5203 Hypermetropia, bilateral: Secondary | ICD-10-CM | POA: Diagnosis not present

## 2018-03-17 DIAGNOSIS — H524 Presbyopia: Secondary | ICD-10-CM | POA: Diagnosis not present

## 2018-03-17 DIAGNOSIS — E119 Type 2 diabetes mellitus without complications: Secondary | ICD-10-CM | POA: Diagnosis not present

## 2018-03-17 DIAGNOSIS — E11622 Type 2 diabetes mellitus with other skin ulcer: Secondary | ICD-10-CM | POA: Diagnosis not present

## 2018-03-21 DIAGNOSIS — I872 Venous insufficiency (chronic) (peripheral): Secondary | ICD-10-CM | POA: Diagnosis not present

## 2018-03-21 DIAGNOSIS — J449 Chronic obstructive pulmonary disease, unspecified: Secondary | ICD-10-CM | POA: Diagnosis not present

## 2018-03-21 DIAGNOSIS — E11622 Type 2 diabetes mellitus with other skin ulcer: Secondary | ICD-10-CM | POA: Diagnosis not present

## 2018-03-21 DIAGNOSIS — L97221 Non-pressure chronic ulcer of left calf limited to breakdown of skin: Secondary | ICD-10-CM | POA: Diagnosis not present

## 2018-03-21 DIAGNOSIS — G4733 Obstructive sleep apnea (adult) (pediatric): Secondary | ICD-10-CM | POA: Diagnosis not present

## 2018-03-21 DIAGNOSIS — E1151 Type 2 diabetes mellitus with diabetic peripheral angiopathy without gangrene: Secondary | ICD-10-CM | POA: Diagnosis not present

## 2018-03-21 DIAGNOSIS — I509 Heart failure, unspecified: Secondary | ICD-10-CM | POA: Diagnosis not present

## 2018-03-21 DIAGNOSIS — Z8572 Personal history of non-Hodgkin lymphomas: Secondary | ICD-10-CM | POA: Diagnosis not present

## 2018-03-21 DIAGNOSIS — G2581 Restless legs syndrome: Secondary | ICD-10-CM | POA: Diagnosis not present

## 2018-03-21 DIAGNOSIS — R69 Illness, unspecified: Secondary | ICD-10-CM | POA: Diagnosis not present

## 2018-03-22 DIAGNOSIS — G4733 Obstructive sleep apnea (adult) (pediatric): Secondary | ICD-10-CM | POA: Diagnosis not present

## 2018-03-22 DIAGNOSIS — I509 Heart failure, unspecified: Secondary | ICD-10-CM | POA: Diagnosis not present

## 2018-03-22 DIAGNOSIS — I872 Venous insufficiency (chronic) (peripheral): Secondary | ICD-10-CM | POA: Diagnosis not present

## 2018-03-22 DIAGNOSIS — R69 Illness, unspecified: Secondary | ICD-10-CM | POA: Diagnosis not present

## 2018-03-22 DIAGNOSIS — G2581 Restless legs syndrome: Secondary | ICD-10-CM | POA: Diagnosis not present

## 2018-03-22 DIAGNOSIS — E11622 Type 2 diabetes mellitus with other skin ulcer: Secondary | ICD-10-CM | POA: Diagnosis not present

## 2018-03-22 DIAGNOSIS — E1151 Type 2 diabetes mellitus with diabetic peripheral angiopathy without gangrene: Secondary | ICD-10-CM | POA: Diagnosis not present

## 2018-03-22 DIAGNOSIS — L97221 Non-pressure chronic ulcer of left calf limited to breakdown of skin: Secondary | ICD-10-CM | POA: Diagnosis not present

## 2018-03-22 DIAGNOSIS — J449 Chronic obstructive pulmonary disease, unspecified: Secondary | ICD-10-CM | POA: Diagnosis not present

## 2018-03-22 DIAGNOSIS — Z8572 Personal history of non-Hodgkin lymphomas: Secondary | ICD-10-CM | POA: Diagnosis not present

## 2018-03-24 DIAGNOSIS — E1151 Type 2 diabetes mellitus with diabetic peripheral angiopathy without gangrene: Secondary | ICD-10-CM | POA: Diagnosis not present

## 2018-03-24 DIAGNOSIS — E11622 Type 2 diabetes mellitus with other skin ulcer: Secondary | ICD-10-CM | POA: Diagnosis not present

## 2018-03-24 DIAGNOSIS — J449 Chronic obstructive pulmonary disease, unspecified: Secondary | ICD-10-CM | POA: Diagnosis not present

## 2018-03-24 DIAGNOSIS — L97221 Non-pressure chronic ulcer of left calf limited to breakdown of skin: Secondary | ICD-10-CM | POA: Diagnosis not present

## 2018-03-24 DIAGNOSIS — G4733 Obstructive sleep apnea (adult) (pediatric): Secondary | ICD-10-CM | POA: Diagnosis not present

## 2018-03-24 DIAGNOSIS — I872 Venous insufficiency (chronic) (peripheral): Secondary | ICD-10-CM | POA: Diagnosis not present

## 2018-03-24 DIAGNOSIS — G2581 Restless legs syndrome: Secondary | ICD-10-CM | POA: Diagnosis not present

## 2018-03-24 DIAGNOSIS — Z8572 Personal history of non-Hodgkin lymphomas: Secondary | ICD-10-CM | POA: Diagnosis not present

## 2018-03-24 DIAGNOSIS — R69 Illness, unspecified: Secondary | ICD-10-CM | POA: Diagnosis not present

## 2018-03-24 DIAGNOSIS — I509 Heart failure, unspecified: Secondary | ICD-10-CM | POA: Diagnosis not present

## 2018-03-26 DIAGNOSIS — I872 Venous insufficiency (chronic) (peripheral): Secondary | ICD-10-CM | POA: Diagnosis not present

## 2018-03-26 DIAGNOSIS — R69 Illness, unspecified: Secondary | ICD-10-CM | POA: Diagnosis not present

## 2018-03-26 DIAGNOSIS — G2581 Restless legs syndrome: Secondary | ICD-10-CM | POA: Diagnosis not present

## 2018-03-26 DIAGNOSIS — I509 Heart failure, unspecified: Secondary | ICD-10-CM | POA: Diagnosis not present

## 2018-03-26 DIAGNOSIS — L97221 Non-pressure chronic ulcer of left calf limited to breakdown of skin: Secondary | ICD-10-CM | POA: Diagnosis not present

## 2018-03-26 DIAGNOSIS — E11622 Type 2 diabetes mellitus with other skin ulcer: Secondary | ICD-10-CM | POA: Diagnosis not present

## 2018-03-26 DIAGNOSIS — G4733 Obstructive sleep apnea (adult) (pediatric): Secondary | ICD-10-CM | POA: Diagnosis not present

## 2018-03-26 DIAGNOSIS — J449 Chronic obstructive pulmonary disease, unspecified: Secondary | ICD-10-CM | POA: Diagnosis not present

## 2018-03-26 DIAGNOSIS — Z8572 Personal history of non-Hodgkin lymphomas: Secondary | ICD-10-CM | POA: Diagnosis not present

## 2018-03-26 DIAGNOSIS — E1151 Type 2 diabetes mellitus with diabetic peripheral angiopathy without gangrene: Secondary | ICD-10-CM | POA: Diagnosis not present

## 2018-03-28 DIAGNOSIS — R69 Illness, unspecified: Secondary | ICD-10-CM | POA: Diagnosis not present

## 2018-03-28 DIAGNOSIS — I509 Heart failure, unspecified: Secondary | ICD-10-CM | POA: Diagnosis not present

## 2018-03-28 DIAGNOSIS — L97221 Non-pressure chronic ulcer of left calf limited to breakdown of skin: Secondary | ICD-10-CM | POA: Diagnosis not present

## 2018-03-28 DIAGNOSIS — Z8572 Personal history of non-Hodgkin lymphomas: Secondary | ICD-10-CM | POA: Diagnosis not present

## 2018-03-28 DIAGNOSIS — J449 Chronic obstructive pulmonary disease, unspecified: Secondary | ICD-10-CM | POA: Diagnosis not present

## 2018-03-28 DIAGNOSIS — G4733 Obstructive sleep apnea (adult) (pediatric): Secondary | ICD-10-CM | POA: Diagnosis not present

## 2018-03-28 DIAGNOSIS — E11622 Type 2 diabetes mellitus with other skin ulcer: Secondary | ICD-10-CM | POA: Diagnosis not present

## 2018-03-28 DIAGNOSIS — G2581 Restless legs syndrome: Secondary | ICD-10-CM | POA: Diagnosis not present

## 2018-03-28 DIAGNOSIS — E1151 Type 2 diabetes mellitus with diabetic peripheral angiopathy without gangrene: Secondary | ICD-10-CM | POA: Diagnosis not present

## 2018-03-28 DIAGNOSIS — I872 Venous insufficiency (chronic) (peripheral): Secondary | ICD-10-CM | POA: Diagnosis not present

## 2018-04-01 DIAGNOSIS — J449 Chronic obstructive pulmonary disease, unspecified: Secondary | ICD-10-CM | POA: Diagnosis not present

## 2018-04-01 DIAGNOSIS — Z8572 Personal history of non-Hodgkin lymphomas: Secondary | ICD-10-CM | POA: Diagnosis not present

## 2018-04-01 DIAGNOSIS — R69 Illness, unspecified: Secondary | ICD-10-CM | POA: Diagnosis not present

## 2018-04-01 DIAGNOSIS — I872 Venous insufficiency (chronic) (peripheral): Secondary | ICD-10-CM | POA: Diagnosis not present

## 2018-04-01 DIAGNOSIS — E11622 Type 2 diabetes mellitus with other skin ulcer: Secondary | ICD-10-CM | POA: Diagnosis not present

## 2018-04-01 DIAGNOSIS — I509 Heart failure, unspecified: Secondary | ICD-10-CM | POA: Diagnosis not present

## 2018-04-01 DIAGNOSIS — L97221 Non-pressure chronic ulcer of left calf limited to breakdown of skin: Secondary | ICD-10-CM | POA: Diagnosis not present

## 2018-04-01 DIAGNOSIS — E1151 Type 2 diabetes mellitus with diabetic peripheral angiopathy without gangrene: Secondary | ICD-10-CM | POA: Diagnosis not present

## 2018-04-01 DIAGNOSIS — G2581 Restless legs syndrome: Secondary | ICD-10-CM | POA: Diagnosis not present

## 2018-04-01 DIAGNOSIS — G4733 Obstructive sleep apnea (adult) (pediatric): Secondary | ICD-10-CM | POA: Diagnosis not present

## 2018-04-02 DIAGNOSIS — I872 Venous insufficiency (chronic) (peripheral): Secondary | ICD-10-CM | POA: Diagnosis not present

## 2018-04-02 DIAGNOSIS — R600 Localized edema: Secondary | ICD-10-CM | POA: Diagnosis not present

## 2018-04-02 DIAGNOSIS — L97828 Non-pressure chronic ulcer of other part of left lower leg with other specified severity: Secondary | ICD-10-CM | POA: Diagnosis not present

## 2018-04-04 DIAGNOSIS — J449 Chronic obstructive pulmonary disease, unspecified: Secondary | ICD-10-CM | POA: Diagnosis not present

## 2018-04-04 DIAGNOSIS — Z8572 Personal history of non-Hodgkin lymphomas: Secondary | ICD-10-CM | POA: Diagnosis not present

## 2018-04-04 DIAGNOSIS — E1151 Type 2 diabetes mellitus with diabetic peripheral angiopathy without gangrene: Secondary | ICD-10-CM | POA: Diagnosis not present

## 2018-04-04 DIAGNOSIS — R69 Illness, unspecified: Secondary | ICD-10-CM | POA: Diagnosis not present

## 2018-04-04 DIAGNOSIS — L97221 Non-pressure chronic ulcer of left calf limited to breakdown of skin: Secondary | ICD-10-CM | POA: Diagnosis not present

## 2018-04-04 DIAGNOSIS — G4733 Obstructive sleep apnea (adult) (pediatric): Secondary | ICD-10-CM | POA: Diagnosis not present

## 2018-04-04 DIAGNOSIS — I872 Venous insufficiency (chronic) (peripheral): Secondary | ICD-10-CM | POA: Diagnosis not present

## 2018-04-04 DIAGNOSIS — G2581 Restless legs syndrome: Secondary | ICD-10-CM | POA: Diagnosis not present

## 2018-04-04 DIAGNOSIS — E11622 Type 2 diabetes mellitus with other skin ulcer: Secondary | ICD-10-CM | POA: Diagnosis not present

## 2018-04-04 DIAGNOSIS — I509 Heart failure, unspecified: Secondary | ICD-10-CM | POA: Diagnosis not present

## 2018-04-07 DIAGNOSIS — E11622 Type 2 diabetes mellitus with other skin ulcer: Secondary | ICD-10-CM | POA: Diagnosis not present

## 2018-04-07 DIAGNOSIS — G4733 Obstructive sleep apnea (adult) (pediatric): Secondary | ICD-10-CM | POA: Diagnosis not present

## 2018-04-07 DIAGNOSIS — G2581 Restless legs syndrome: Secondary | ICD-10-CM | POA: Diagnosis not present

## 2018-04-07 DIAGNOSIS — I872 Venous insufficiency (chronic) (peripheral): Secondary | ICD-10-CM | POA: Diagnosis not present

## 2018-04-07 DIAGNOSIS — R69 Illness, unspecified: Secondary | ICD-10-CM | POA: Diagnosis not present

## 2018-04-07 DIAGNOSIS — E1151 Type 2 diabetes mellitus with diabetic peripheral angiopathy without gangrene: Secondary | ICD-10-CM | POA: Diagnosis not present

## 2018-04-07 DIAGNOSIS — Z8572 Personal history of non-Hodgkin lymphomas: Secondary | ICD-10-CM | POA: Diagnosis not present

## 2018-04-07 DIAGNOSIS — L97221 Non-pressure chronic ulcer of left calf limited to breakdown of skin: Secondary | ICD-10-CM | POA: Diagnosis not present

## 2018-04-07 DIAGNOSIS — I509 Heart failure, unspecified: Secondary | ICD-10-CM | POA: Diagnosis not present

## 2018-04-07 DIAGNOSIS — J449 Chronic obstructive pulmonary disease, unspecified: Secondary | ICD-10-CM | POA: Diagnosis not present

## 2018-04-10 DIAGNOSIS — L97221 Non-pressure chronic ulcer of left calf limited to breakdown of skin: Secondary | ICD-10-CM | POA: Diagnosis not present

## 2018-04-10 DIAGNOSIS — G4733 Obstructive sleep apnea (adult) (pediatric): Secondary | ICD-10-CM | POA: Diagnosis not present

## 2018-04-10 DIAGNOSIS — I509 Heart failure, unspecified: Secondary | ICD-10-CM | POA: Diagnosis not present

## 2018-04-10 DIAGNOSIS — E11622 Type 2 diabetes mellitus with other skin ulcer: Secondary | ICD-10-CM | POA: Diagnosis not present

## 2018-04-10 DIAGNOSIS — G2581 Restless legs syndrome: Secondary | ICD-10-CM | POA: Diagnosis not present

## 2018-04-10 DIAGNOSIS — R69 Illness, unspecified: Secondary | ICD-10-CM | POA: Diagnosis not present

## 2018-04-10 DIAGNOSIS — I872 Venous insufficiency (chronic) (peripheral): Secondary | ICD-10-CM | POA: Diagnosis not present

## 2018-04-10 DIAGNOSIS — Z8572 Personal history of non-Hodgkin lymphomas: Secondary | ICD-10-CM | POA: Diagnosis not present

## 2018-04-10 DIAGNOSIS — E1151 Type 2 diabetes mellitus with diabetic peripheral angiopathy without gangrene: Secondary | ICD-10-CM | POA: Diagnosis not present

## 2018-04-10 DIAGNOSIS — J449 Chronic obstructive pulmonary disease, unspecified: Secondary | ICD-10-CM | POA: Diagnosis not present

## 2018-04-12 DIAGNOSIS — I509 Heart failure, unspecified: Secondary | ICD-10-CM | POA: Diagnosis not present

## 2018-05-07 DIAGNOSIS — R05 Cough: Secondary | ICD-10-CM | POA: Diagnosis not present

## 2018-05-07 DIAGNOSIS — J019 Acute sinusitis, unspecified: Secondary | ICD-10-CM | POA: Diagnosis not present

## 2018-05-07 DIAGNOSIS — R070 Pain in throat: Secondary | ICD-10-CM | POA: Diagnosis not present

## 2018-05-13 DIAGNOSIS — I509 Heart failure, unspecified: Secondary | ICD-10-CM | POA: Diagnosis not present

## 2018-05-16 DIAGNOSIS — I872 Venous insufficiency (chronic) (peripheral): Secondary | ICD-10-CM | POA: Diagnosis not present

## 2018-05-16 DIAGNOSIS — L97228 Non-pressure chronic ulcer of left calf with other specified severity: Secondary | ICD-10-CM | POA: Diagnosis not present

## 2018-05-16 DIAGNOSIS — R600 Localized edema: Secondary | ICD-10-CM | POA: Diagnosis not present

## 2018-05-20 DIAGNOSIS — E11622 Type 2 diabetes mellitus with other skin ulcer: Secondary | ICD-10-CM | POA: Diagnosis not present

## 2018-05-20 DIAGNOSIS — L97811 Non-pressure chronic ulcer of other part of right lower leg limited to breakdown of skin: Secondary | ICD-10-CM | POA: Diagnosis not present

## 2018-05-20 DIAGNOSIS — G4733 Obstructive sleep apnea (adult) (pediatric): Secondary | ICD-10-CM | POA: Diagnosis not present

## 2018-05-20 DIAGNOSIS — L97821 Non-pressure chronic ulcer of other part of left lower leg limited to breakdown of skin: Secondary | ICD-10-CM | POA: Diagnosis not present

## 2018-05-20 DIAGNOSIS — E11620 Type 2 diabetes mellitus with diabetic dermatitis: Secondary | ICD-10-CM | POA: Diagnosis not present

## 2018-05-20 DIAGNOSIS — E11621 Type 2 diabetes mellitus with foot ulcer: Secondary | ICD-10-CM | POA: Diagnosis not present

## 2018-05-20 DIAGNOSIS — Z7984 Long term (current) use of oral hypoglycemic drugs: Secondary | ICD-10-CM | POA: Diagnosis not present

## 2018-05-20 DIAGNOSIS — Z9981 Dependence on supplemental oxygen: Secondary | ICD-10-CM | POA: Diagnosis not present

## 2018-06-12 DIAGNOSIS — I509 Heart failure, unspecified: Secondary | ICD-10-CM | POA: Diagnosis not present

## 2018-06-17 DIAGNOSIS — M62838 Other muscle spasm: Secondary | ICD-10-CM | POA: Diagnosis not present

## 2018-06-17 DIAGNOSIS — G2581 Restless legs syndrome: Secondary | ICD-10-CM | POA: Diagnosis not present

## 2018-07-05 DIAGNOSIS — E1129 Type 2 diabetes mellitus with other diabetic kidney complication: Secondary | ICD-10-CM | POA: Diagnosis not present

## 2018-07-05 DIAGNOSIS — G2581 Restless legs syndrome: Secondary | ICD-10-CM | POA: Diagnosis not present

## 2018-07-05 DIAGNOSIS — M79606 Pain in leg, unspecified: Secondary | ICD-10-CM | POA: Diagnosis not present

## 2018-07-05 DIAGNOSIS — Z6841 Body Mass Index (BMI) 40.0 and over, adult: Secondary | ICD-10-CM | POA: Diagnosis not present

## 2018-07-05 DIAGNOSIS — R600 Localized edema: Secondary | ICD-10-CM | POA: Diagnosis not present

## 2018-07-05 DIAGNOSIS — Z1389 Encounter for screening for other disorder: Secondary | ICD-10-CM | POA: Diagnosis not present

## 2018-07-05 DIAGNOSIS — E782 Mixed hyperlipidemia: Secondary | ICD-10-CM | POA: Diagnosis not present

## 2018-07-05 DIAGNOSIS — I10 Essential (primary) hypertension: Secondary | ICD-10-CM | POA: Diagnosis not present

## 2018-07-13 DIAGNOSIS — I509 Heart failure, unspecified: Secondary | ICD-10-CM | POA: Diagnosis not present

## 2019-07-13 ENCOUNTER — Telehealth: Payer: Self-pay | Admitting: Internal Medicine

## 2019-07-13 NOTE — Telephone Encounter (Signed)
I attempted to contact patient 07/13/19 to schedule appointment from referral for Dr. Debara Pickett. Someone answered and stated that was wrong number for patient.

## 2019-07-20 ENCOUNTER — Telehealth: Payer: Self-pay | Admitting: Internal Medicine

## 2019-07-20 NOTE — Telephone Encounter (Signed)
Attempted to contact patient 07/20/19 to schedule initial visit from incoming referral with Dr.Hilty. Someone other than the patient answered and stated the patient no longer had number.

## 2019-07-23 ENCOUNTER — Telehealth: Payer: Self-pay | Admitting: Internal Medicine

## 2019-07-23 NOTE — Telephone Encounter (Signed)
I attempted to contact patient to schedule initial visit with Dr. Debara Pickett from referral. The patient didn't answer so I left message for patient to return call to get appt scheduled.

## 2019-07-29 ENCOUNTER — Encounter: Payer: Self-pay | Admitting: General Practice

## 2019-08-05 ENCOUNTER — Encounter: Payer: Self-pay | Admitting: General Practice

## 2020-02-22 ENCOUNTER — Other Ambulatory Visit: Payer: Self-pay | Admitting: Family Medicine

## 2020-02-22 DIAGNOSIS — Z1231 Encounter for screening mammogram for malignant neoplasm of breast: Secondary | ICD-10-CM

## 2020-04-06 ENCOUNTER — Ambulatory Visit: Payer: Medicare HMO
